# Patient Record
Sex: Male | Born: 1945 | Race: White | Hispanic: No | Smoking: Former smoker
Health system: Southern US, Community
[De-identification: ages and names within clinical notes are randomized; demographics above are authoritative.]

## PROBLEM LIST (undated history)

## (undated) DIAGNOSIS — I4891 Unspecified atrial fibrillation: Secondary | ICD-10-CM

## (undated) DIAGNOSIS — I639 Cerebral infarction, unspecified: Secondary | ICD-10-CM

## (undated) DIAGNOSIS — U071 COVID-19: Secondary | ICD-10-CM

## (undated) DIAGNOSIS — J9690 Respiratory failure, unspecified, unspecified whether with hypoxia or hypercapnia: Secondary | ICD-10-CM

## (undated) DIAGNOSIS — N19 Unspecified kidney failure: Secondary | ICD-10-CM

## (undated) DIAGNOSIS — E871 Hypo-osmolality and hyponatremia: Secondary | ICD-10-CM

## (undated) DIAGNOSIS — G9341 Metabolic encephalopathy: Secondary | ICD-10-CM

## (undated) DIAGNOSIS — Z720 Tobacco use: Secondary | ICD-10-CM

## (undated) DIAGNOSIS — R1310 Dysphagia, unspecified: Secondary | ICD-10-CM

## (undated) DIAGNOSIS — N179 Acute kidney failure, unspecified: Secondary | ICD-10-CM

## (undated) DIAGNOSIS — I10 Essential (primary) hypertension: Secondary | ICD-10-CM

## (undated) DIAGNOSIS — M6282 Rhabdomyolysis: Secondary | ICD-10-CM

## (undated) HISTORY — PX: SHOULDER SURGERY: SHX246

## (undated) HISTORY — PX: BACK SURGERY: SHX140

---

## 2019-06-15 ENCOUNTER — Emergency Department (HOSPITAL_COMMUNITY)
Admission: EM | Admit: 2019-06-15 | Discharge: 2019-06-16 | Disposition: A | Discharge status: Home / Self Care | Payer: Medicare PPO | Attending: Emergency Medicine | Admitting: Emergency Medicine

## 2019-06-15 ENCOUNTER — Other Ambulatory Visit: Payer: Self-pay

## 2019-06-15 ENCOUNTER — Emergency Department (HOSPITAL_COMMUNITY): Payer: Medicare PPO

## 2019-06-15 ENCOUNTER — Encounter (HOSPITAL_COMMUNITY): Payer: Self-pay | Admitting: Emergency Medicine

## 2019-06-15 DIAGNOSIS — Z79899 Other long term (current) drug therapy: Secondary | ICD-10-CM | POA: Insufficient documentation

## 2019-06-15 DIAGNOSIS — R42 Dizziness and giddiness: Secondary | ICD-10-CM | POA: Diagnosis present

## 2019-06-15 DIAGNOSIS — Z7982 Long term (current) use of aspirin: Secondary | ICD-10-CM | POA: Diagnosis not present

## 2019-06-15 DIAGNOSIS — F17210 Nicotine dependence, cigarettes, uncomplicated: Secondary | ICD-10-CM | POA: Insufficient documentation

## 2019-06-15 HISTORY — DX: Cerebral infarction, unspecified: I63.9

## 2019-06-15 LAB — CBC WITH DIFFERENTIAL/PLATELET
Abs Immature Granulocytes: 0.05 10*3/uL (ref 0.00–0.07)
Basophils Absolute: 0.1 10*3/uL (ref 0.0–0.1)
Basophils Relative: 1 %
Eosinophils Absolute: 0.1 10*3/uL (ref 0.0–0.5)
Eosinophils Relative: 1 %
HCT: 33.2 % — ABNORMAL LOW (ref 39.0–52.0)
Hemoglobin: 10.6 g/dL — ABNORMAL LOW (ref 13.0–17.0)
Immature Granulocytes: 0 %
Lymphocytes Relative: 9 %
Lymphs Abs: 1.1 10*3/uL (ref 0.7–4.0)
MCH: 31.7 pg (ref 26.0–34.0)
MCHC: 31.9 g/dL (ref 30.0–36.0)
MCV: 99.4 fL (ref 80.0–100.0)
Monocytes Absolute: 0.8 10*3/uL (ref 0.1–1.0)
Monocytes Relative: 7 %
Neutro Abs: 9.6 10*3/uL — ABNORMAL HIGH (ref 1.7–7.7)
Neutrophils Relative %: 82 %
Platelets: 360 10*3/uL (ref 150–400)
RBC: 3.34 MIL/uL — ABNORMAL LOW (ref 4.22–5.81)
RDW: 13.3 % (ref 11.5–15.5)
WBC: 11.7 10*3/uL — ABNORMAL HIGH (ref 4.0–10.5)
nRBC: 0 % (ref 0.0–0.2)

## 2019-06-15 LAB — COMPREHENSIVE METABOLIC PANEL
ALT: 15 U/L (ref 0–44)
AST: 19 U/L (ref 15–41)
Albumin: 3.3 g/dL — ABNORMAL LOW (ref 3.5–5.0)
Alkaline Phosphatase: 76 U/L (ref 38–126)
Anion gap: 7 (ref 5–15)
BUN: 30 mg/dL — ABNORMAL HIGH (ref 8–23)
CO2: 21 mmol/L — ABNORMAL LOW (ref 22–32)
Calcium: 10.1 mg/dL (ref 8.9–10.3)
Chloride: 101 mmol/L (ref 98–111)
Creatinine, Ser: 0.96 mg/dL (ref 0.61–1.24)
GFR calc Af Amer: >60 mL/min (ref >60)
GFR calc non Af Amer: >60 mL/min (ref >60)
Glucose, Bld: 99 mg/dL (ref 70–99)
Potassium: 3.9 mmol/L (ref 3.5–5.1)
Sodium: 129 mmol/L — ABNORMAL LOW (ref 135–145)
Total Bilirubin: 0.2 mg/dL — ABNORMAL LOW (ref 0.3–1.2)
Total Protein: 6.6 g/dL (ref 6.5–8.1)

## 2019-06-15 LAB — TROPONIN I (HIGH SENSITIVITY)
Troponin I (High Sensitivity): 19 ng/L — ABNORMAL HIGH (ref <18)
Troponin I (High Sensitivity): 18 ng/L — ABNORMAL HIGH (ref <18)

## 2019-06-15 MED ORDER — SODIUM CHLORIDE 0.9 % IV BOLUS
1000.0000 mL | Freq: Once | INTRAVENOUS | Status: AC
  Administered 2019-06-15: 20:00:00 1000 mL via INTRAVENOUS

## 2019-06-15 NOTE — Discharge Instructions (Signed)
Follow-up with family doctor within a week.  Return if any problems

## 2019-06-15 NOTE — ED Triage Notes (Signed)
Pt c/o dizziness and fall striking head. Pt arrived with c-collar.

## 2019-06-15 NOTE — ED Provider Notes (Signed)
[facility] EMERGENCY DEPARTMENT Provider Note   CSN: [id number] Arrival date & time: 06/15/19  1946     History   Chief Complaint Chief Complaint  Patient presents with   Dizziness    HPI Anthony Andrews is a 73 y.o. male.     Patient was in the chair and became dizzy and had a syncopal episode last about 15 seconds.  It was witnessed by son  The history is provided by the patient. No language interpreter was used.  Dizziness Quality:  Lightheadedness Severity:  Mild Onset quality:  Sudden Timing:  Intermittent Progression:  Improving Chronicity:  New Associated symptoms: no chest pain, no diarrhea and no headaches     Past Medical History:  Diagnosis Date   Stroke ([place])     There are no active problems to display for this patient.   History reviewed. No pertinent surgical history.      Home Medications    Prior to Admission medications   Medication Sig Start Date End Date Taking? Authorizing Provider  albuterol (VENTOLIN HFA) 108 (90 Base) MCG/ACT inhaler Inhale 2 puffs into the lungs every 4 (four) hours.  05/27/19  Yes [provider]  amLODipine (NORVASC) 5 MG tablet Take 5 mg by mouth daily. 05/25/19  Yes [provider]  aspirin EC 81 MG tablet Take 81 mg by mouth every morning.   Yes [provider]  cholecalciferol (VITAMIN D3) 25 MCG (1000 UT) tablet Take 1,000 Units by mouth daily.   Yes [provider]  PARoxetine (PAXIL) 20 MG tablet Take 20 mg by mouth every evening.    Yes [provider]  rosuvastatin (CRESTOR) 10 MG tablet Take 10 mg by mouth at bedtime. 06/08/19  Yes [provider]    Family History No family history on file.  Social History Social History   Tobacco Use   Smoking status: Current Every Day Smoker   Smokeless tobacco: Former Network engineer Use Topics   Alcohol use: Not Currently   Drug use: Never     Allergies   Patient has no known allergies.   Review of  Systems Review of Systems  Constitutional: Negative for appetite change and fatigue.  HENT: Negative for congestion, ear discharge and sinus pressure.   Eyes: Negative for discharge.  Respiratory: Negative for cough.   Cardiovascular: Negative for chest pain.  Gastrointestinal: Negative for abdominal pain and diarrhea.  Genitourinary: Negative for frequency and hematuria.  Musculoskeletal: Negative for back pain.  Skin: Negative for rash.  Neurological: Positive for dizziness. Negative for seizures and headaches.  Psychiatric/Behavioral: Negative for hallucinations.     Physical Exam Updated Vital Signs BP 130/68    Pulse 88    Temp 97.6 F (36.4 C) (Oral)    Resp 17    SpO2 98%   Physical Exam Vitals signs and nursing note reviewed.  Constitutional:      Appearance: He is well-developed.  HENT:     Head: Normocephalic.     Nose: Nose normal.  Eyes:     General: No scleral icterus.    Conjunctiva/sclera: Conjunctivae normal.  Neck:     Musculoskeletal: Neck supple.     Thyroid: No thyromegaly.  Cardiovascular:     Rate and Rhythm: Normal rate and regular rhythm.     Heart sounds: No murmur. No friction rub. No gallop.   Pulmonary:     Breath sounds: No stridor. No wheezing or rales.  Chest:     Chest wall:  No tenderness.  Abdominal:     General: There is no distension.     Tenderness: There is no abdominal tenderness. There is no rebound.  Musculoskeletal: Normal range of motion.  Lymphadenopathy:     Cervical: No cervical adenopathy.  Skin:    Findings: No erythema or rash.  Neurological:     Mental Status: He is oriented to person, place, and time.     Motor: No abnormal muscle tone.     Coordination: Coordination normal.  Psychiatric:        Behavior: Behavior normal.      ED Treatments / Results  Labs (all labs ordered are listed, but only abnormal results are displayed) Labs Reviewed  CBC WITH DIFFERENTIAL/PLATELET - Abnormal; Notable for the  following components:      Result Value   WBC 11.7 (*)    RBC 3.34 (*)    Hemoglobin 10.6 (*)    HCT 33.2 (*)    Neutro Abs 9.6 (*)    All other components within normal limits  COMPREHENSIVE METABOLIC PANEL - Abnormal; Notable for the following components:   Sodium 129 (*)    CO2 21 (*)    BUN 30 (*)    Albumin 3.3 (*)    Total Bilirubin 0.2 (*)    All other components within normal limits  TROPONIN I (HIGH SENSITIVITY) - Abnormal; Notable for the following components:   Troponin I (High Sensitivity) 19 (*)    All other components within normal limits  TROPONIN I (HIGH SENSITIVITY)    EKG EKG Interpretation  Date/Time:  Monday June 15 2019 19:54:08 EDT Ventricular Rate:  79 PR Interval:    QRS Duration: 92 QT Interval:  372 QTC Calculation: 427 R Axis:   79 Text Interpretation:  Sinus rhythm Anteroseptal infarct, old Confirmed by Milton Ferguson [phone]) on 06/15/2019 9:49:04 PM   Radiology Ct Head Wo Contrast  Result Date: 06/15/2019 CLINICAL DATA:  Status post fall with a blow to the head secondary to dizziness today. Initial encounter. EXAM: CT HEAD WITHOUT CONTRAST CT CERVICAL SPINE WITHOUT CONTRAST TECHNIQUE: Multidetector CT imaging of the head and cervical spine was performed following the standard protocol without intravenous contrast. Multiplanar CT image reconstructions of the cervical spine were also generated. COMPARISON:  None. FINDINGS: CT HEAD FINDINGS Brain: No evidence of acute infarction, hemorrhage, hydrocephalus, extra-axial collection or mass lesion/mass effect. Atrophy, chronic microvascular ischemic change, remote right thalamic and basal ganglia lacunar infarctions noted. Remote lacunar infarction left centrum semiovale also seen. Vascular: Extensive atherosclerosis. Skull: Intact.  No focal lesion. Sinuses/Orbits: Negative. Other: None. CT CERVICAL SPINE FINDINGS Alignment: Maintained with straightening of lordosis noted. Skull base and vertebrae: No  acute fracture. No primary bone lesion or focal pathologic process. Soft tissues and spinal canal: No prevertebral fluid or swelling. No visible canal hematoma. Disc levels: Multilevel loss of disc space height and endplate spurring are seen. Upper chest: Emphysema noted. Other: None. IMPRESSION: No acute abnormality head or cervical spine. Atrophy and chronic microvascular ischemic change with scattered remote lacunar infarctions. Atherosclerosis. Multilevel degenerative disc disease. Emphysema. Electronically Signed   By: Inge Rise M.D.   On: 06/15/2019 20:49   Ct Cervical Spine Wo Contrast  Result Date: 06/15/2019 CLINICAL DATA:  Status post fall with a blow to the head secondary to dizziness today. Initial encounter. EXAM: CT HEAD WITHOUT CONTRAST CT CERVICAL SPINE WITHOUT CONTRAST TECHNIQUE: Multidetector CT imaging of the head and cervical spine was performed following the standard protocol without intravenous  contrast. Multiplanar CT image reconstructions of the cervical spine were also generated. COMPARISON:  None. FINDINGS: CT HEAD FINDINGS Brain: No evidence of acute infarction, hemorrhage, hydrocephalus, extra-axial collection or mass lesion/mass effect. Atrophy, chronic microvascular ischemic change, remote right thalamic and basal ganglia lacunar infarctions noted. Remote lacunar infarction left centrum semiovale also seen. Vascular: Extensive atherosclerosis. Skull: Intact.  No focal lesion. Sinuses/Orbits: Negative. Other: None. CT CERVICAL SPINE FINDINGS Alignment: Maintained with straightening of lordosis noted. Skull base and vertebrae: No acute fracture. No primary bone lesion or focal pathologic process. Soft tissues and spinal canal: No prevertebral fluid or swelling. No visible canal hematoma. Disc levels: Multilevel loss of disc space height and endplate spurring are seen. Upper chest: Emphysema noted. Other: None. IMPRESSION: No acute abnormality head or cervical spine. Atrophy and  chronic microvascular ischemic change with scattered remote lacunar infarctions. Atherosclerosis. Multilevel degenerative disc disease. Emphysema. Electronically Signed   By: Inge Rise M.D.   On: 06/15/2019 20:49   Dg Chest Portable 1 View  Result Date: 06/15/2019 CLINICAL DATA:  Weakness, dizziness EXAM: PORTABLE CHEST 1 VIEW COMPARISON:  None. FINDINGS: The heart size and mediastinal contours are within normal limits. Coarsened interstitial markings throughout both lungs. No focal airspace consolidation. No pleural effusion or pneumothorax. IMPRESSION: 1. No focal airspace consolidation. 2. Coarsened interstitial markings throughout both lungs could represent bronchitic type lung changes versus chronic interstitial lung disease. Comparison with prior outside radiographs is recommended. Electronically Signed   By: Davina Poke M.D.   On: 06/15/2019 20:57    Procedures Procedures (including critical care time)  Medications Ordered in ED Medications  sodium chloride 0.9 % bolus 1,000 mL (0 mLs Intravenous Stopped 06/15/19 2240)     Initial Impression / Assessment and Plan / ED Course  I have reviewed the triage vital signs and the nursing notes.  Pertinent labs & imaging results that were available during my care of the patient were reviewed by me and considered in my medical decision making (see chart for details).       Labs unremarkable.  CT scan negative.  Patient with dizziness and syncope.  He will follow-up with his PCP  Final Clinical Impressions(s) / ED Diagnoses   Final diagnoses:  None    ED Discharge Orders    None       Milton Ferguson, MD 06/15/19 2249

## 2019-09-11 ENCOUNTER — Encounter (HOSPITAL_COMMUNITY): Payer: Self-pay

## 2019-09-11 ENCOUNTER — Other Ambulatory Visit: Payer: Self-pay

## 2019-09-11 ENCOUNTER — Emergency Department (HOSPITAL_COMMUNITY): Payer: Medicare PPO

## 2019-09-11 ENCOUNTER — Inpatient Hospital Stay (HOSPITAL_COMMUNITY)
Admission: EM | Admit: 2019-09-11 | Discharge: 2019-09-21 | DRG: 564 | Disposition: A | Discharge status: Skilled Nursing Facility | Payer: Medicare PPO | Attending: Internal Medicine | Admitting: Internal Medicine

## 2019-09-11 DIAGNOSIS — Y92009 Unspecified place in unspecified non-institutional (private) residence as the place of occurrence of the external cause: Secondary | ICD-10-CM

## 2019-09-11 DIAGNOSIS — I129 Hypertensive chronic kidney disease with stage 1 through stage 4 chronic kidney disease, or unspecified chronic kidney disease: Secondary | ICD-10-CM | POA: Diagnosis present

## 2019-09-11 DIAGNOSIS — F0150 Vascular dementia without behavioral disturbance: Secondary | ICD-10-CM | POA: Diagnosis present

## 2019-09-11 DIAGNOSIS — E872 Acidosis, unspecified: Secondary | ICD-10-CM | POA: Diagnosis present

## 2019-09-11 DIAGNOSIS — I358 Other nonrheumatic aortic valve disorders: Secondary | ICD-10-CM | POA: Diagnosis present

## 2019-09-11 DIAGNOSIS — Z79899 Other long term (current) drug therapy: Secondary | ICD-10-CM

## 2019-09-11 DIAGNOSIS — R7989 Other specified abnormal findings of blood chemistry: Secondary | ICD-10-CM

## 2019-09-11 DIAGNOSIS — J9601 Acute respiratory failure with hypoxia: Secondary | ICD-10-CM | POA: Diagnosis not present

## 2019-09-11 DIAGNOSIS — N182 Chronic kidney disease, stage 2 (mild): Secondary | ICD-10-CM | POA: Diagnosis present

## 2019-09-11 DIAGNOSIS — E860 Dehydration: Secondary | ICD-10-CM | POA: Diagnosis present

## 2019-09-11 DIAGNOSIS — E8809 Other disorders of plasma-protein metabolism, not elsewhere classified: Secondary | ICD-10-CM | POA: Diagnosis present

## 2019-09-11 DIAGNOSIS — I6522 Occlusion and stenosis of left carotid artery: Secondary | ICD-10-CM | POA: Diagnosis present

## 2019-09-11 DIAGNOSIS — W19XXXA Unspecified fall, initial encounter: Secondary | ICD-10-CM

## 2019-09-11 DIAGNOSIS — Z7982 Long term (current) use of aspirin: Secondary | ICD-10-CM

## 2019-09-11 DIAGNOSIS — M6282 Rhabdomyolysis: Secondary | ICD-10-CM | POA: Diagnosis present

## 2019-09-11 DIAGNOSIS — J90 Pleural effusion, not elsewhere classified: Secondary | ICD-10-CM | POA: Diagnosis present

## 2019-09-11 DIAGNOSIS — D638 Anemia in other chronic diseases classified elsewhere: Secondary | ICD-10-CM | POA: Diagnosis present

## 2019-09-11 DIAGNOSIS — G9341 Metabolic encephalopathy: Secondary | ICD-10-CM | POA: Diagnosis present

## 2019-09-11 DIAGNOSIS — R001 Bradycardia, unspecified: Secondary | ICD-10-CM | POA: Diagnosis present

## 2019-09-11 DIAGNOSIS — W010XXA Fall on same level from slipping, tripping and stumbling without subsequent striking against object, initial encounter: Secondary | ICD-10-CM | POA: Diagnosis present

## 2019-09-11 DIAGNOSIS — D631 Anemia in chronic kidney disease: Secondary | ICD-10-CM | POA: Diagnosis present

## 2019-09-11 DIAGNOSIS — Z23 Encounter for immunization: Secondary | ICD-10-CM

## 2019-09-11 DIAGNOSIS — T796XXA Traumatic ischemia of muscle, initial encounter: Secondary | ICD-10-CM | POA: Diagnosis not present

## 2019-09-11 DIAGNOSIS — N179 Acute kidney failure, unspecified: Secondary | ICD-10-CM | POA: Diagnosis present

## 2019-09-11 DIAGNOSIS — Z8249 Family history of ischemic heart disease and other diseases of the circulatory system: Secondary | ICD-10-CM

## 2019-09-11 DIAGNOSIS — Z20828 Contact with and (suspected) exposure to other viral communicable diseases: Secondary | ICD-10-CM | POA: Diagnosis present

## 2019-09-11 DIAGNOSIS — F17210 Nicotine dependence, cigarettes, uncomplicated: Secondary | ICD-10-CM | POA: Diagnosis present

## 2019-09-11 DIAGNOSIS — I471 Supraventricular tachycardia: Secondary | ICD-10-CM | POA: Diagnosis present

## 2019-09-11 DIAGNOSIS — R0602 Shortness of breath: Secondary | ICD-10-CM

## 2019-09-11 DIAGNOSIS — R4182 Altered mental status, unspecified: Secondary | ICD-10-CM

## 2019-09-11 DIAGNOSIS — I69354 Hemiplegia and hemiparesis following cerebral infarction affecting left non-dominant side: Secondary | ICD-10-CM

## 2019-09-11 DIAGNOSIS — I4891 Unspecified atrial fibrillation: Secondary | ICD-10-CM | POA: Diagnosis not present

## 2019-09-11 DIAGNOSIS — E1122 Type 2 diabetes mellitus with diabetic chronic kidney disease: Secondary | ICD-10-CM | POA: Diagnosis present

## 2019-09-11 DIAGNOSIS — J439 Emphysema, unspecified: Secondary | ICD-10-CM | POA: Diagnosis present

## 2019-09-11 DIAGNOSIS — E871 Hypo-osmolality and hyponatremia: Secondary | ICD-10-CM | POA: Diagnosis present

## 2019-09-11 DIAGNOSIS — Z716 Tobacco abuse counseling: Secondary | ICD-10-CM

## 2019-09-11 HISTORY — DX: Metabolic encephalopathy: G93.41

## 2019-09-11 HISTORY — DX: Rhabdomyolysis: M62.82

## 2019-09-11 HISTORY — DX: Tobacco use: Z72.0

## 2019-09-11 HISTORY — DX: Essential (primary) hypertension: I10

## 2019-09-11 HISTORY — DX: Hypo-osmolality and hyponatremia: E87.1

## 2019-09-11 HISTORY — DX: Acute kidney failure, unspecified: N17.9

## 2019-09-11 LAB — CBC WITH DIFFERENTIAL/PLATELET
Abs Immature Granulocytes: 0.07 10*3/uL (ref 0.00–0.07)
Basophils Absolute: 0.1 10*3/uL (ref 0.0–0.1)
Basophils Relative: 0 %
Eosinophils Absolute: 0 10*3/uL (ref 0.0–0.5)
Eosinophils Relative: 0 %
HCT: 33.5 % — ABNORMAL LOW (ref 39.0–52.0)
Hemoglobin: 11.7 g/dL — ABNORMAL LOW (ref 13.0–17.0)
Immature Granulocytes: 1 %
Lymphocytes Relative: 8 %
Lymphs Abs: 1.1 10*3/uL (ref 0.7–4.0)
MCH: 32.8 pg (ref 26.0–34.0)
MCHC: 34.9 g/dL (ref 30.0–36.0)
MCV: 93.8 fL (ref 80.0–100.0)
Monocytes Absolute: 0.8 10*3/uL (ref 0.1–1.0)
Monocytes Relative: 6 %
Neutro Abs: 10.9 10*3/uL — ABNORMAL HIGH (ref 1.7–7.7)
Neutrophils Relative %: 85 %
Platelets: 532 10*3/uL — ABNORMAL HIGH (ref 150–400)
RBC: 3.57 MIL/uL — ABNORMAL LOW (ref 4.22–5.81)
RDW: 12.1 % (ref 11.5–15.5)
WBC: 12.9 10*3/uL — ABNORMAL HIGH (ref 4.0–10.5)
nRBC: 0 % (ref 0.0–0.2)

## 2019-09-11 LAB — COMPREHENSIVE METABOLIC PANEL
ALT: 58 U/L — ABNORMAL HIGH (ref 0–44)
AST: 194 U/L — ABNORMAL HIGH (ref 15–41)
Albumin: 2.8 g/dL — ABNORMAL LOW (ref 3.5–5.0)
Alkaline Phosphatase: 72 U/L (ref 38–126)
Anion gap: 14 (ref 5–15)
BUN: 24 mg/dL — ABNORMAL HIGH (ref 8–23)
CO2: 19 mmol/L — ABNORMAL LOW (ref 22–32)
Calcium: 10.4 mg/dL — ABNORMAL HIGH (ref 8.9–10.3)
Chloride: 96 mmol/L — ABNORMAL LOW (ref 98–111)
Creatinine, Ser: 1.45 mg/dL — ABNORMAL HIGH (ref 0.61–1.24)
GFR calc Af Amer: 56 mL/min — ABNORMAL LOW (ref >60)
GFR calc non Af Amer: 48 mL/min — ABNORMAL LOW (ref >60)
Glucose, Bld: 105 mg/dL — ABNORMAL HIGH (ref 70–99)
Potassium: 4.5 mmol/L (ref 3.5–5.1)
Sodium: 129 mmol/L — ABNORMAL LOW (ref 135–145)
Total Bilirubin: 0.5 mg/dL (ref 0.3–1.2)
Total Protein: 7 g/dL (ref 6.5–8.1)

## 2019-09-11 LAB — CK: Total CK: 4536 U/L — ABNORMAL HIGH (ref 49–397)

## 2019-09-11 LAB — ETHANOL: Alcohol, Ethyl (B): <10 mg/dL (ref <10)

## 2019-09-11 MED ORDER — ACETAMINOPHEN 325 MG PO TABS
650.0000 mg | ORAL_TABLET | Freq: Four times a day (QID) | ORAL | Status: DC | PRN
  Administered 2019-09-18 – 2019-09-20 (×2): 650 mg via ORAL
  Filled 2019-09-11 (×3): qty 2

## 2019-09-11 MED ORDER — NICOTINE 7 MG/24HR TD PT24
7.0000 mg | MEDICATED_PATCH | Freq: Every day | TRANSDERMAL | Status: DC
  Administered 2019-09-12 – 2019-09-21 (×9): 7 mg via TRANSDERMAL
  Filled 2019-09-11 (×10): qty 1

## 2019-09-11 MED ORDER — ONDANSETRON HCL 4 MG/2ML IJ SOLN
4.0000 mg | Freq: Once | INTRAMUSCULAR | Status: AC
  Administered 2019-09-11: 19:00:00 4 mg via INTRAVENOUS
  Filled 2019-09-11: qty 2

## 2019-09-11 MED ORDER — SODIUM CHLORIDE 0.9 % IV SOLN: INTRAVENOUS | Status: AC

## 2019-09-11 MED ORDER — FENTANYL CITRATE (PF) 100 MCG/2ML IJ SOLN: 100.0000 ug | Freq: Once | INTRAMUSCULAR | Status: DC

## 2019-09-11 MED ORDER — ENOXAPARIN SODIUM 40 MG/0.4ML ~~LOC~~ SOLN
40.0000 mg | Freq: Every day | SUBCUTANEOUS | Status: DC
  Administered 2019-09-12 – 2019-09-16 (×5): 40 mg via SUBCUTANEOUS
  Filled 2019-09-11 (×5): qty 0.4

## 2019-09-11 MED ORDER — SODIUM CHLORIDE 0.9 % IV BOLUS
1000.0000 mL | Freq: Once | INTRAVENOUS | Status: AC
  Administered 2019-09-11: 21:00:00 1000 mL via INTRAVENOUS

## 2019-09-11 MED ORDER — ACETAMINOPHEN 650 MG RE SUPP: 650.0000 mg | Freq: Four times a day (QID) | RECTAL | Status: DC | PRN

## 2019-09-11 MED ORDER — SODIUM CHLORIDE 0.9 % IV BOLUS
1000.0000 mL | Freq: Once | INTRAVENOUS | Status: AC
  Administered 2019-09-11: 19:00:00 1000 mL via INTRAVENOUS

## 2019-09-11 MED ORDER — ONDANSETRON HCL 4 MG/2ML IJ SOLN: 4.0000 mg | Freq: Once | INTRAMUSCULAR | Status: DC

## 2019-09-11 NOTE — ED Provider Notes (Signed)
[facility] EMERGENCY DEPARTMENT Provider Note   CSN: [id number] Arrival date & time: 09/11/19  1803     History Chief Complaint  Patient presents with  . Fall    Anthony Andrews is a 73 y.o. male possible history of stroke brought in by EMS for evaluation of fall.  Patient reports that last night, he went out to have a smoke and when he returned, he tripped over the threshold of the door and fell forward, hitting his head.  He does not think he had any LOC.  He had had difficulty getting himself up and could not get off the floor.  He sat there for about 18 hours until his son found him.  After his son found him, he was able to ambulate.  Patient went out to go have another smoking and had an episode of vomiting, prompting EMS call.  Patient states that he did not have any preceding chest pain or dizziness that caused him to fall.  He states he has pain to his head and to his left shoulder.  He states that he feels like he has some trouble breathing and states that he does usually use an inhaler.  Patient denies any chest pain, abdominal pain, numbness/weakness of his arms or legs.  The history is provided by the patient.       Past Medical History:  Diagnosis Date  . Stroke [facility])     Patient Active Problem List   Diagnosis Date Noted  . Rhabdomyolysis 09/11/2019    History reviewed. No pertinent surgical history.     No family history on file.  Social History   Tobacco Use  . Smoking status: Current Every Day Smoker  . Smokeless tobacco: Former Network engineer Use Topics  . Alcohol use: Not Currently  . Drug use: Never    Home Medications Prior to Admission medications   Medication Sig Start Date End Date Taking? Authorizing Provider  albuterol (VENTOLIN HFA) 108 (90 Base) MCG/ACT inhaler Inhale 2 puffs into the lungs every 4 (four) hours.  05/27/19   [provider]  amLODipine (NORVASC) 5 MG tablet Take 5 mg by mouth daily. 05/25/19    [provider]  aspirin EC 81 MG tablet Take 81 mg by mouth every morning.    [provider]  cholecalciferol (VITAMIN D3) 25 MCG (1000 UT) tablet Take 1,000 Units by mouth daily.    [provider]  PARoxetine (PAXIL) 20 MG tablet Take 20 mg by mouth every evening.     [provider]  rosuvastatin (CRESTOR) 10 MG tablet Take 10 mg by mouth at bedtime. 06/08/19   [provider]    Allergies    Patient has no known allergies.  Review of Systems   Review of Systems  Constitutional: Negative for fever.  Respiratory: Negative for cough and shortness of breath.   Cardiovascular: Negative for chest pain.  Gastrointestinal: Negative for abdominal pain, nausea and vomiting.  Genitourinary: Negative for dysuria and hematuria.  Musculoskeletal:       Left shoulder pain.  Neurological: Positive for headaches.  All other systems reviewed and are negative.   Physical Exam Updated Vital Signs BP 103/63   Pulse 98   Temp 98.2 F (36.8 C) (Rectal)   Resp (!) 25   Ht 5\' 3"  (1.6 m)   Wt 54.4 kg   SpO2 99%   BMI 21.26 kg/m   Physical Exam Vitals and nursing note reviewed.  Constitutional:  Appearance: Normal appearance. He is well-developed.  HENT:     Head: Normocephalic and atraumatic.      Comments: Tenderness palpation of the frontal head.  No deformity or crepitus noted. Eyes:     General: Lids are normal.     Conjunctiva/sclera: Conjunctivae normal.     Pupils: Pupils are equal, round, and reactive to light.  Neck:     Comments: C-collar in place. Cardiovascular:     Rate and Rhythm: Normal rate and regular rhythm.     Pulses: Normal pulses.          Radial pulses are 2+ on the right side and 2+ on the left side.       Dorsalis pedis pulses are 2+ on the right side and 2+ on the left side.     Heart sounds: Normal heart sounds. No murmur. No friction rub. No gallop.   Pulmonary:     Effort: Pulmonary effort is normal.      Breath sounds: Normal breath sounds.     Comments: Lungs clear to auscultation bilaterally.  Symmetric chest rise.  No wheezing, rales, rhonchi. Abdominal:     Palpations: Abdomen is soft. Abdomen is not rigid.     Tenderness: There is no abdominal tenderness. There is no guarding.     Comments: Abdomen is soft, non-distended, non-tender. No rigidity, No guarding. No peritoneal signs.  Musculoskeletal:        General: Normal range of motion.     Comments: Tenderness palpation noted to left shoulder.  No deformity or crepitus noted.  Limited range of motion secondary to pain.  No bony tenderness of the left elbow, left wrist.  No tenderness palpation noted to right upper extremity.  No pelvic instability.  No tenderness palpation of the bilateral lower extremities.  Skin:    General: Skin is warm and dry.     Capillary Refill: Capillary refill takes less than 2 seconds.  Neurological:     Mental Status: He is alert.     Comments: He is alert and oriented x2.  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 Cranial nerves III-XII intact Follows commands, Moves all extremities  5/5 strength to BUE and BLE  Sensation intact throughout all major nerve distributions No slurred speech. No facial droop.   Psychiatric:        Speech: Speech normal.     ED Results / Procedures / Treatments   Labs (all labs ordered are listed, but only abnormal results are displayed) Labs Reviewed  COMPREHENSIVE METABOLIC PANEL - Abnormal; Notable for the following components:      Result Value   Sodium 129 (*)    Chloride 96 (*)    CO2 19 (*)    Glucose, Bld 105 (*)    BUN 24 (*)    Creatinine, Ser 1.45 (*)    Calcium 10.4 (*)    Albumin 2.8 (*)    AST 194 (*)    ALT 58 (*)    GFR calc non Af Amer 48 (*)    GFR calc Af Amer 56 (*)    All other components within normal limits  CBC WITH DIFFERENTIAL/PLATELET  - Abnormal; Notable for the following components:   WBC 12.9 (*)    RBC 3.57 (*)    Hemoglobin 11.7 (*)    HCT 33.5 (*)  Platelets 532 (*)    Neutro Abs 10.9 (*)    All other components within normal limits  CK - Abnormal; Notable for the following components:   Total CK 4,536 (*)    All other components within normal limits  SARS CORONAVIRUS 2 (TAT 6-24 HRS)  ETHANOL  URINALYSIS, ROUTINE W REFLEX MICROSCOPIC  CK  COMPREHENSIVE METABOLIC PANEL    EKG None  Radiology DG Chest 2 View  Result Date: 09/11/2019 CLINICAL DATA:  Fall with chest injury and pain. EXAM: CHEST - 2 VIEW COMPARISON:  06/15/2011 FINDINGS: The cardiomediastinal silhouette is unremarkable. Mild chronic peribronchial thickening again noted. There is no evidence of focal airspace disease, pulmonary edema, suspicious pulmonary nodule/mass, pleural effusion, or pneumothorax. No acute bony abnormalities are identified. Surgical changes in the LEFT scapula noted. IMPRESSION: No active cardiopulmonary disease. Electronically Signed   By: Margarette Canada M.D.   On: 09/11/2019 19:45   DG Pelvis 1-2 Views  Result Date: 09/11/2019 CLINICAL DATA:  Fall, pain EXAM: PELVIS - 1-2 VIEW COMPARISON:  None. FINDINGS: Hip joints and SI joints are symmetric and unremarkable. No acute bony abnormality. Specifically, no fracture, subluxation, or dislocation. Postoperative changes in the lower lumbar spine. IMPRESSION: No acute bony abnormality. Electronically Signed   By: Rolm Baptise M.D.   On: 09/11/2019 19:27   CT Head Wo Contrast  Result Date: 09/11/2019 CLINICAL DATA:  Right-sided head pain after falling and striking head. EXAM: CT HEAD WITHOUT CONTRAST TECHNIQUE: Contiguous axial images were obtained from the base of the skull through the vertex without intravenous contrast. COMPARISON:  CT head 06/15/2019. FINDINGS: Brain: There is no evidence of acute intracranial hemorrhage, mass lesion, brain edema or extra-axial fluid  collection. There is stable atrophy with prominence of the ventricles and subarachnoid spaces. There are chronic small vessel ischemic changes in the periventricular white matter and basal ganglia bilaterally. There is no CT evidence of acute cortical infarction. Vascular: Intracranial vascular calcifications. No hyperdense vessel identified. Skull: Negative for fracture or focal lesion. Sinuses/Orbits: The visualized paranasal sinuses are clear. There is a chronic mastoid effusion on the right with partial opacification of right middle ear. The mastoid air cells and middle ear on the left are clear. Other: None. IMPRESSION: 1. No acute intracranial or calvarial findings. 2. Stable atrophy and chronic small vessel ischemic changes. 3. Chronic right mastoid effusion and partial right middle ear opacification. Electronically Signed   By: Richardean Sale M.D.   On: 09/11/2019 20:43   CT Cervical Spine Wo Contrast  Result Date: 09/11/2019 CLINICAL DATA:  Mechanical fall in the intra way, impact to head and concrete floor. EXAM: CT CERVICAL SPINE WITHOUT CONTRAST TECHNIQUE: Multidetector CT imaging of the cervical spine was performed without intravenous contrast. Multiplanar CT image reconstructions were also generated. COMPARISON:  06/15/2019 FINDINGS: Alignment: Normal. Skull base and vertebrae: No acute fracture. No primary bone lesion or focal pathologic process. Soft tissues and spinal canal: No prevertebral fluid or swelling. No visible canal hematoma. Atherosclerotic calcifications in the bilateral carotid arteries. Disc levels: Multilevel spinal degenerative changes without change from the previous exam. Upper chest: Signs of emphysema at the lung apices as before. Some apical scarring as well. Other: None IMPRESSION: 1. Spinal degenerative changes without acute fracture of the cervical spine. 2. Pulmonary emphysema. 3. Incidental note again made of atherosclerotic calcifications, dense calcifications of the  carotid bulbs bilaterally. Electronically Signed   By: Zetta Bills M.D.   On: 09/11/2019 20:45   DG Shoulder Left  Result Date: 09/11/2019 CLINICAL DATA:  Pain, fall. EXAM: LEFT SHOULDER - 2+ VIEW COMPARISON:  Chest x-ray 06/15/2019 FINDINGS: Screw Mains in place within the scapula. Glenohumeral joint is located with signs of degenerative change. No signs of acute fracture or dislocation. IMPRESSION: 1. Postoperative changes about the left shoulder, no signs of acute fracture. Electronically Signed   By: Zetta Bills M.D.   On: 09/11/2019 19:25    Procedures Procedures (including critical care time)  Medications Ordered in ED Medications  fentaNYL (SUBLIMAZE) injection 100 mcg (100 mcg Intravenous Not Given 09/11/19 2302)  ondansetron (ZOFRAN) injection 4 mg (4 mg Intravenous Not Given 09/11/19 2302)  sodium chloride 0.9 % bolus 1,000 mL (0 mLs Intravenous Stopped 09/11/19 2050)  ondansetron (ZOFRAN) injection 4 mg (4 mg Intravenous Given 09/11/19 1836)  sodium chloride 0.9 % bolus 1,000 mL (1,000 mLs Intravenous New Bag/Given 09/11/19 2056)    ED Course  I have reviewed the triage vital signs and the nursing notes.  Pertinent labs & imaging results that were available during my care of the patient were reviewed by me and considered in my medical decision making (see chart for details).  Clinical Course as of Sep 10 2316  Fri Sep 11, 2019  1941 Patient was seen by myself as well as PA provider.  Briefly is a 73 year old male presents to emergency department after mechanical fall and spending approximately 10 hours in the ground.  He reports his left leg gave out on him and he tripped over a door stoop yesterday evening.  He fell indoors and then was able to get up.  He said he had no support to pull himself up.  He denies any lightheadedness or loss of consciousness prior to this fall.  He states his son came by today and found him on the ground.  The patient typically lives at home  with his wife, is currently in the hospital for broken hip.   [MT]  2102 Back from CT.  Collar cleared.  Will bolus IVF and recheck labs   [MT]    Clinical Course User Index [MT] Trifan, Carola Rhine, MD   MDM Rules/Calculators/A&P                      73 year old male brought in by EMS for evaluation after mechanical fall that occurred approximately 18 hours prior to ED arrival.  Patient had prolonged downtime.  Denies any chest pain or dizziness.  On ED arrival, he complains of pain to his head, his left shoulder.  Initially arrival, he is afebrile, nontoxic-appearing.  He is slightly tachycardic and tachypneic.  Vitals otherwise stable.  Plan check labs, imaging.  CMP shows sodium 129, BUN 24, creatinine 1.45.  AST and ALT is slightly elevated.  CBC shows leukocytosis of 12.9.  Hemoglobin is 11.7.  CK is 4536.  X-ray of chest is unremarkable.  X-ray of pelvis shows no acute bony abnormalities. X-ray left shoulder shows positive postoperative changes about the left shoulder.  No signs of acute fracture.  CT head negative for any acute intracranial normality.  CT C-spine shows degenerative changes but no acute fracture dislocation.  Re-evaluation. Patient still saying that its 2004 and Anthony Andrews is president.   I discussed with his son Anthony Andrews, who states that patient is normally not confused. He is concerned that dad may be deteriorating after his stroke. He has residual LLE weakness/deficits.   Given continued altered mental status and rhabdomyolysis, will plan to admit  patient.  Discussed patient with Dr. Marlowe Sax (hospitalist) who accepts patient for admission.   Portions of this note were generated with [employer]. Dictation errors may occur despite best attempts at proofreading.   Final Clinical Impression(s) / ED Diagnoses Final diagnoses:  Fall, initial encounter  Traumatic rhabdomyolysis, initial encounter ([place])  Altered mental status, unspecified altered  mental status type    Rx / DC Orders ED Discharge Orders    None       Desma Mcgregor 09/11/19 2317    Wyvonnia Dusky, MD 09/12/19 1134

## 2019-09-11 NOTE — H&P (Addendum)
History and Physical    Anthony Andrews TAV:[id number] DOB: 12/11/1945 DOA: 09/11/2019  PCP: Patient, No Pcp Per Patient coming from: Home  Chief Complaint: Fall  HPI: Anthony Andrews is a 73 y.o. 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  Patient states he tripped over a door frame and fell on the ground.  He hit his head but did not lose consciousness.  He was down on the ground for approximately 18 hours.  He was found by his son who called EMS.  Denies any neck pain, back pain, or pain anywhere else.  He did not have any dizziness/lightheadedness, chest pain, shortness of breath, or heart palpitations prior to his fall.  Denies recent illness, fevers, or chills.  Denies nausea, vomiting, diarrhea, or abdominal pain.  States he has a chronic cough as he is a smoker.  He is not any blood thinners.  ED Course: Slightly and tachypneic.  Blood pressure stable.  Not hypoxic.  Afebrile.  WBC count 12.9.  Hemoglobin 11.7, above baseline.  Platelet count 532,000, above baseline.  Sodium 129, no change since labs done 3 months ago.  Bicarb 19, anion gap 14.  Blood glucose 105.  BUN 24, creatinine 1.4.  Creatinine was 0.9 previously.  AST 194, ALT 58.  Alk phos and T bili normal. Transaminases were normal previously.  UA pending.  Blood ethanol level undetectable.  CK 4536.  SARS-CoV-2 PCR test pending. Chest x-ray showing no active cardiopulmonary disease. Pelvic x-ray negative for fracture/dislocation. X-ray of left shoulder negative for fracture/dislocation. Head CT negative for acute intracranial abnormality. CT C-spine negative for acute fracture. Patient appeared disoriented.  He thought it was 2004 and [name] was the president.  Son told ED provider that this was a change as patient is normally not confused. Patient received fentanyl, Zofran, and 2 L normal saline  boluses.  Review of Systems:  All systems reviewed and apart from history of presenting illness, are negative.  Past Medical History:  Diagnosis Date  . Stroke [facility])     History reviewed. No pertinent surgical history.   reports that he has been smoking. He has quit using smokeless tobacco. He reports previous alcohol use. He reports that he does not use drugs.  No Known Allergies  History reviewed. No pertinent family history.  Prior to Admission medications   Medication Sig Start Date End Date Taking? Authorizing Provider  albuterol (VENTOLIN HFA) 108 (90 Base) MCG/ACT inhaler Inhale 2 puffs into the lungs every 4 (four) hours.  05/27/19   [provider]  amLODipine (NORVASC) 5 MG tablet Take 5 mg by mouth daily. 05/25/19   [provider]  aspirin EC 81 MG tablet Take 81 mg by mouth every morning.    [provider]  cholecalciferol (VITAMIN D3) 25 MCG (1000 UT) tablet Take 1,000 Units by mouth daily.    [provider]  PARoxetine (PAXIL) 20 MG tablet Take 20 mg by mouth every evening.     [provider]  rosuvastatin (CRESTOR) 10 MG tablet Take 10 mg by mouth at bedtime. 06/08/19   [provider]    Physical Exam: Vitals:   09/11/19 2130 09/11/19 2230 09/11/19 2245 09/11/19 2300  BP: (!) 133/99 109/87 107/66 103/63  Pulse:  (!) 104 98 98  Resp: 19 (!) 22 (!) 25 (!) 25  Temp:  TempSrc:      SpO2:  100% 99% 99%  Weight:      Height:        Physical Exam  Constitutional: No distress.  HENT:  Head: Normocephalic.  Dry mucous membranes  Eyes: Right eye exhibits no discharge. Left eye exhibits no discharge.  Cardiovascular: Normal rate, regular rhythm and intact distal pulses.  Pulmonary/Chest: Effort normal and breath sounds normal. No respiratory distress. He has no wheezes. He has no rales.  Abdominal: Soft. Bowel sounds are normal. He exhibits no distension. There is no abdominal tenderness. There is no  guarding.  Musculoskeletal:        General: No edema.     Cervical back: Neck supple.  Neurological: He is alert. No cranial nerve deficit.  Patient knows his name.  He is aware that he is at a hospital.  Does not know which city he is in.  Not oriented to time.  He thinks it is 2010.  He thinks [name] is the president. No focal motor or sensory deficit  Skin: Skin is warm and dry. He is not diaphoretic.  Decreased skin turgor     Labs on Admission: I have personally reviewed following labs and imaging studies  CBC: Recent Labs  Lab 09/11/19 1838  WBC 12.9*  NEUTROABS 10.9*  HGB 11.7*  HCT 33.5*  MCV 93.8  PLT 161*   Basic Metabolic Panel: Recent Labs  Lab 09/11/19 1838  NA 129*  K 4.5  CL 96*  CO2 19*  GLUCOSE 105*  BUN 24*  CREATININE 1.45*  CALCIUM 10.4*   GFR: Estimated Creatinine Clearance: 36 mL/min (A) (by C-G formula based on SCr of 1.45 mg/dL (H)). Liver Function Tests: Recent Labs  Lab 09/11/19 1838  AST 194*  ALT 58*  ALKPHOS 72  BILITOT 0.5  PROT 7.0  ALBUMIN 2.8*   No results for input(s): LIPASE, AMYLASE in the last 168 hours. No results for input(s): AMMONIA in the last 168 hours. Coagulation Profile: No results for input(s): INR, PROTIME in the last 168 hours. Cardiac Enzymes: Recent Labs  Lab 09/11/19 1838  CKTOTAL 4,536*   BNP (last 3 results) No results for input(s): PROBNP in the last 8760 hours. HbA1C: No results for input(s): HGBA1C in the last 72 hours. CBG: No results for input(s): GLUCAP in the last 168 hours. Lipid Profile: No results for input(s): CHOL, HDL, LDLCALC, TRIG, CHOLHDL, LDLDIRECT in the last 72 hours. Thyroid Function Tests: No results for input(s): TSH, T4TOTAL, FREET4, T3FREE, THYROIDAB in the last 72 hours. Anemia Panel: No results for input(s): VITAMINB12, FOLATE, FERRITIN, TIBC, IRON, RETICCTPCT in the last 72 hours. Urine analysis: No results found for: COLORURINE, APPEARANCEUR, LABSPEC,  [place], GLUCOSEU, HGBUR, BILIRUBINUR, KETONESUR, PROTEINUR, UROBILINOGEN, NITRITE, LEUKOCYTESUR  Radiological Exams on Admission: DG Chest 2 View  Result Date: 09/11/2019 CLINICAL DATA:  Fall with chest injury and pain. EXAM: CHEST - 2 VIEW COMPARISON:  06/15/2011 FINDINGS: The cardiomediastinal silhouette is unremarkable. Mild chronic peribronchial thickening again noted. There is no evidence of focal airspace disease, pulmonary edema, suspicious pulmonary nodule/mass, pleural effusion, or pneumothorax. No acute bony abnormalities are identified. Surgical changes in the LEFT scapula noted. IMPRESSION: No active cardiopulmonary disease. Electronically Signed   By: Margarette Canada M.D.   On: 09/11/2019 19:45   DG Pelvis 1-2 Views  Result Date: 09/11/2019 CLINICAL DATA:  Fall, pain EXAM: PELVIS - 1-2 VIEW COMPARISON:  None. FINDINGS: Hip joints and SI joints are symmetric and unremarkable. No acute  bony abnormality. Specifically, no fracture, subluxation, or dislocation. Postoperative changes in the lower lumbar spine. IMPRESSION: No acute bony abnormality. Electronically Signed   By: Rolm Baptise M.D.   On: 09/11/2019 19:27   CT Head Wo Contrast  Result Date: 09/11/2019 CLINICAL DATA:  Right-sided head pain after falling and striking head. EXAM: CT HEAD WITHOUT CONTRAST TECHNIQUE: Contiguous axial images were obtained from the base of the skull through the vertex without intravenous contrast. COMPARISON:  CT head 06/15/2019. FINDINGS: Brain: There is no evidence of acute intracranial hemorrhage, mass lesion, brain edema or extra-axial fluid collection. There is stable atrophy with prominence of the ventricles and subarachnoid spaces. There are chronic small vessel ischemic changes in the periventricular white matter and basal ganglia bilaterally. There is no CT evidence of acute cortical infarction. Vascular: Intracranial vascular calcifications. No hyperdense vessel identified. Skull: Negative for  fracture or focal lesion. Sinuses/Orbits: The visualized paranasal sinuses are clear. There is a chronic mastoid effusion on the right with partial opacification of right middle ear. The mastoid air cells and middle ear on the left are clear. Other: None. IMPRESSION: 1. No acute intracranial or calvarial findings. 2. Stable atrophy and chronic small vessel ischemic changes. 3. Chronic right mastoid effusion and partial right middle ear opacification. Electronically Signed   By: Richardean Sale M.D.   On: 09/11/2019 20:43   CT Cervical Spine Wo Contrast  Result Date: 09/11/2019 CLINICAL DATA:  Mechanical fall in the intra way, impact to head and concrete floor. EXAM: CT CERVICAL SPINE WITHOUT CONTRAST TECHNIQUE: Multidetector CT imaging of the cervical spine was performed without intravenous contrast. Multiplanar CT image reconstructions were also generated. COMPARISON:  06/15/2019 FINDINGS: Alignment: Normal. Skull base and vertebrae: No acute fracture. No primary bone lesion or focal pathologic process. Soft tissues and spinal canal: No prevertebral fluid or swelling. No visible canal hematoma. Atherosclerotic calcifications in the bilateral carotid arteries. Disc levels: Multilevel spinal degenerative changes without change from the previous exam. Upper chest: Signs of emphysema at the lung apices as before. Some apical scarring as well. Other: None IMPRESSION: 1. Spinal degenerative changes without acute fracture of the cervical spine. 2. Pulmonary emphysema. 3. Incidental note again made of atherosclerotic calcifications, dense calcifications of the carotid bulbs bilaterally. Electronically Signed   By: Zetta Bills M.D.   On: 09/11/2019 20:45   DG Shoulder Left  Result Date: 09/11/2019 CLINICAL DATA:  Pain, fall. EXAM: LEFT SHOULDER - 2+ VIEW COMPARISON:  Chest x-ray 06/15/2019 FINDINGS: Screw Mains in place within the scapula. Glenohumeral joint is located with signs of degenerative change. No  signs of acute fracture or dislocation. IMPRESSION: 1. Postoperative changes about the left shoulder, no signs of acute fracture. Electronically Signed   By: Zetta Bills M.D.   On: 09/11/2019 19:25    EKG: Pending at this time.  Assessment/Plan Principal Problem:   Rhabdomyolysis Active Problems:   AKI (acute kidney injury) ([place])   Acute metabolic encephalopathy   Chronic hyponatremia   Metabolic acidosis   Rhabdomyolysis Patient had a mechanical fall at home and was down on the ground for 18 hours.  CK elevated at 4536.  Appears very dehydrated. -Received 2 L normal saline boluses.  Continue IV fluid hydration. -Continue to trend CK  AKI Likely secondary to rhabdomyolysis and dehydration. BUN 24, creatinine 1.4.  Creatinine was 0.9 previously. -IV fluid hydration -Continue to monitor renal function -Monitor urine output  Mechanical fall He has not sustained any major injuries from the fall.  CT head, CT C-spine, pelvic x-ray, and left shoulder x-ray negative for acute finding.  Not complaining of any pain and appears comfortable on exam. -PT evaluation  Acute metabolic encephalopathy Appears slightly confused and disoriented.  He does not know which city he is in.  He thinks it is 2010 and that [name] is the president.  Could possibly be related to severe dehydration. ?Underlying infection given mild leukocytosis.  However, chest x-ray not suggestive of pneumonia.  Head CT negative for acute intracranial abnormality.  Blood ethanol level undetectable. -UA pending -Check ammonia, TSH, B12 levels -UDS  Chronic hyponatremia Sodium 129, no change since labs done 3 months ago.  Does not take a diuretic.  Not endorsing any vomiting or diarrhea. -IV fluid hydration -Continue to monitor sodium level  Normal anion gap metabolic acidosis Bicarb 19, anion gap 14.  Likely related to AKI. -IV fluid hydration -Continue to monitor  Mild transaminitis Suspect related to  rhabdomyolysis.  AST 194, ALT 58.  Alk phos and T bili normal. Transaminases were normal on labs done 3 months ago.  Patient denies current alcohol use. -IV fluid hydration for rhabdomyolysis -Continue to monitor LFTs  Tobacco use Smokes 3 cigarettes/day. -Counseling -NicoDerm patch  Pharmacy med rec pending.  DVT prophylaxis: Lovenox Code Status: Full code Family Communication: No family available at this time. Disposition Plan: Anticipate discharge after clinical improvement. Consults called: None Admission status: It is my clinical opinion that referral for OBSERVATION is reasonable and necessary in this patient based on the above information provided. The aforementioned taken together are felt to place the patient at high risk for further clinical deterioration. However it is anticipated that the patient may be medically stable for discharge from the hospital within 24 to 48 hours.  The medical decision making on this patient was of high complexity and the patient is at high risk for clinical deterioration, therefore this is a level 3 visit.  Shela Leff MD [facility] Pager [phone]  If 7PM-7AM, please contact night-coverage [email or web address] Password [email or web address]  09/12/2019, 12:13 AM

## 2019-09-11 NOTE — ED Notes (Signed)
Off the floor to CT 

## 2019-09-11 NOTE — ED Notes (Signed)
Pt. Alert and oriented. IV infusing well IV sight looks good. Pt. Denies any pain or nausea at this time and does not want pain or nauseas medication at this time

## 2019-09-11 NOTE — ED Notes (Signed)
Pt. Continues to move hand and stop fluid infusion. Inspected Iv along with physician and all looks good. Pt. Is comfortable and vitals WDL

## 2019-09-11 NOTE — ED Notes (Signed)
Patient transported to X-ray 

## 2019-09-11 NOTE — ED Triage Notes (Signed)
Pt from home via ems; unwitnessed mechanical fall last night, tripped over doorframe; down for approx 18 hours; hit head, denies loc, denies dizziness; found by son who called ems; only complaint R head pain, L shoulder pain, some sob; no neck or back pain; per son, pt is "out of it" A and O x 2, doesn't know year or city; not on thinners; 1 episode of vomiting; able to standand pivot, bears weight on both legs; hx stroke, drop foot left side  95% RA 160/80 107 HR 16 RR CBG 103 96.4 T

## 2019-09-12 DIAGNOSIS — E872 Acidosis, unspecified: Secondary | ICD-10-CM | POA: Diagnosis present

## 2019-09-12 DIAGNOSIS — N179 Acute kidney failure, unspecified: Secondary | ICD-10-CM | POA: Diagnosis present

## 2019-09-12 DIAGNOSIS — E871 Hypo-osmolality and hyponatremia: Secondary | ICD-10-CM | POA: Diagnosis present

## 2019-09-12 DIAGNOSIS — G9341 Metabolic encephalopathy: Secondary | ICD-10-CM | POA: Diagnosis present

## 2019-09-12 DIAGNOSIS — T796XXA Traumatic ischemia of muscle, initial encounter: Secondary | ICD-10-CM | POA: Diagnosis not present

## 2019-09-12 LAB — RAPID URINE DRUG SCREEN, HOSP PERFORMED
Amphetamines: NOT DETECTED
Barbiturates: NOT DETECTED
Benzodiazepines: NOT DETECTED
Cocaine: NOT DETECTED
Opiates: NOT DETECTED
Tetrahydrocannabinol: NOT DETECTED

## 2019-09-12 LAB — URINALYSIS, ROUTINE W REFLEX MICROSCOPIC
Bacteria, UA: NONE SEEN
Bilirubin Urine: NEGATIVE
Glucose, UA: NEGATIVE mg/dL
Hgb urine dipstick: NEGATIVE
Ketones, ur: 5 mg/dL — AB
Leukocytes,Ua: NEGATIVE
Nitrite: NEGATIVE
Protein, ur: >=300 mg/dL — AB
Specific Gravity, Urine: 1.015 (ref 1.005–1.030)
pH: 6 (ref 5.0–8.0)

## 2019-09-12 LAB — CBC
HCT: 27 % — ABNORMAL LOW (ref 39.0–52.0)
Hemoglobin: 9 g/dL — ABNORMAL LOW (ref 13.0–17.0)
MCH: 31.8 pg (ref 26.0–34.0)
MCHC: 33.3 g/dL (ref 30.0–36.0)
MCV: 95.4 fL (ref 80.0–100.0)
Platelets: 465 10*3/uL — ABNORMAL HIGH (ref 150–400)
RBC: 2.83 MIL/uL — ABNORMAL LOW (ref 4.22–5.81)
RDW: 12.4 % (ref 11.5–15.5)
WBC: 8.9 10*3/uL (ref 4.0–10.5)
nRBC: 0 % (ref 0.0–0.2)

## 2019-09-12 LAB — COMPREHENSIVE METABOLIC PANEL
ALT: 47 U/L — ABNORMAL HIGH (ref 0–44)
AST: 125 U/L — ABNORMAL HIGH (ref 15–41)
Albumin: 2.1 g/dL — ABNORMAL LOW (ref 3.5–5.0)
Alkaline Phosphatase: 54 U/L (ref 38–126)
Anion gap: 8 (ref 5–15)
BUN: 23 mg/dL (ref 8–23)
CO2: 21 mmol/L — ABNORMAL LOW (ref 22–32)
Calcium: 9 mg/dL (ref 8.9–10.3)
Chloride: 102 mmol/L (ref 98–111)
Creatinine, Ser: 1.47 mg/dL — ABNORMAL HIGH (ref 0.61–1.24)
GFR calc Af Amer: 55 mL/min — ABNORMAL LOW (ref >60)
GFR calc non Af Amer: 47 mL/min — ABNORMAL LOW (ref >60)
Glucose, Bld: 95 mg/dL (ref 70–99)
Potassium: 3.6 mmol/L (ref 3.5–5.1)
Sodium: 131 mmol/L — ABNORMAL LOW (ref 135–145)
Total Bilirubin: 0.3 mg/dL (ref 0.3–1.2)
Total Protein: 5.1 g/dL — ABNORMAL LOW (ref 6.5–8.1)

## 2019-09-12 LAB — SARS CORONAVIRUS 2 (TAT 6-24 HRS): SARS Coronavirus 2: NEGATIVE

## 2019-09-12 LAB — TSH: TSH: 3.292 u[IU]/mL (ref 0.350–4.500)

## 2019-09-12 LAB — CK: Total CK: 2034 U/L — ABNORMAL HIGH (ref 49–397)

## 2019-09-12 LAB — VITAMIN B12: Vitamin B-12: 356 pg/mL (ref 180–914)

## 2019-09-12 LAB — AMMONIA: Ammonia: 10 umol/L (ref 9–35)

## 2019-09-12 MED ORDER — ALBUTEROL SULFATE (2.5 MG/3ML) 0.083% IN NEBU
2.5000 mg | INHALATION_SOLUTION | Freq: Three times a day (TID) | RESPIRATORY_TRACT | Status: DC
  Administered 2019-09-12 – 2019-09-13 (×2): 2.5 mg via RESPIRATORY_TRACT
  Filled 2019-09-12 (×2): qty 3

## 2019-09-12 MED ORDER — ALBUTEROL SULFATE (2.5 MG/3ML) 0.083% IN NEBU
2.5000 mg | INHALATION_SOLUTION | RESPIRATORY_TRACT | Status: DC
  Administered 2019-09-12: 2.5 mg via RESPIRATORY_TRACT
  Filled 2019-09-12: qty 3

## 2019-09-12 MED ORDER — PAROXETINE HCL 20 MG PO TABS
20.0000 mg | ORAL_TABLET | Freq: Every evening | ORAL | Status: DC
  Administered 2019-09-12 – 2019-09-20 (×9): 20 mg via ORAL
  Filled 2019-09-12 (×10): qty 1

## 2019-09-12 MED ORDER — METOPROLOL TARTRATE 5 MG/5ML IV SOLN
5.0000 mg | INTRAVENOUS | Status: AC | PRN
  Administered 2019-09-13 – 2019-09-14 (×2): 5 mg via INTRAVENOUS
  Filled 2019-09-12 (×3): qty 5

## 2019-09-12 MED ORDER — SODIUM CHLORIDE 0.9 % IV SOLN: INTRAVENOUS | Status: DC

## 2019-09-12 NOTE — Plan of Care (Signed)
  Problem: Education: Goal: Knowledge of General Education information will improve Description: Including pain rating scale, medication(s)/side effects and non-pharmacologic comfort measures Outcome: Progressing   Problem: Health Behavior/Discharge Planning: Goal: Ability to manage health-related needs will improve Outcome: Progressing   Problem: Clinical Measurements: Goal: Will remain free from infection Outcome: Progressing   Problem: Elimination: Goal: Will not experience complications related to bowel motility Outcome: Progressing   Problem: Pain Managment: Goal: General experience of comfort will improve Outcome: Progressing   Problem: Safety: Goal: Ability to remain free from injury will improve Outcome: Progressing   Problem: Skin Integrity: Goal: Risk for impaired skin integrity will decrease Outcome: Progressing

## 2019-09-12 NOTE — Progress Notes (Signed)
Pt admited from ER via stretcher. Pt able to walk from hallway to his room with some assistance. Chronic cough noted. Pt oriented to the floor and room. Pt is resting and call bell is within reach. Will continue to monitor.

## 2019-09-12 NOTE — Progress Notes (Signed)
Progress Note    Clarance Thaggard  [id number] DOB: 11-29-1945  DOA: 09/11/2019 PCP: Patient, No Pcp Per    Brief Narrative:     Medical records reviewed and are as summarized below:  Nihit Hoying is an 73 y.o. 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  Patient states he tripped over a door frame and fell on the ground.  He hit his head but did not lose consciousness.  He was down on the ground for approximately 18 hours.  He was found by his son who called EMS.    Assessment/Plan:   Principal Problem:   Rhabdomyolysis Active Problems:   AKI (acute kidney injury) ([place])   Acute metabolic encephalopathy   Chronic hyponatremia   Metabolic acidosis   Rhabdomyolysis Patient had a mechanical fall at home and was down on the ground for 18 hours.   -CK elevated at 4536.  - Appears very dehydrated still even after 2 L normal saline boluses.   -Continue IV fluid hydration. -Continue to trend CK  AKI Likely secondary to rhabdomyolysis and dehydration. BUN 24, creatinine 1.4.  Creatinine was 0.9 previously. -IV fluid hydration -Continue to monitor renal function -Monitor urine output  Mechanical fall -CT head, CT C-spine, pelvic x-ray, and left shoulder x-ray negative for acute finding.  Not complaining of any pain and appears comfortable on exam. -PT evaluation  Acute metabolic encephalopathy -baseline mental status is not clear-- lives alone  -he is able to answer simple questions -UA does not reflect UTI -ammonia, TSH, B12 levels wnl  Chronic hyponatremia -trending up with IV fluid hydration -Continue to monitor sodium level  Mild transaminitis -Suspect related to rhabdomyolysis.   -IV fluid hydration for rhabdomyolysis  Tobacco use Smokes 3 cigarettes/day. -Counseling -NicoDerm patch   Family  Communication/Anticipated D/C date and plan/Code Status   DVT prophylaxis: Lovenox ordered. Code Status: Full Code.  Family Communication: unable to reach family (son) Disposition Plan: needs continue IVF and PT eval along with safe d/c plan   Medical Consultants:    None.     Subjective:   C/o being hungry  Objective:    Vitals:   09/11/19 2300 09/12/19 0048 09/12/19 0414 09/12/19 1125  BP: 103/63 109/62 (!) 102/59   Pulse: 98 98 96   Resp: (!) 25     Temp:  98.9 F (37.2 C) 99.4 F (37.4 C)   TempSrc:  Oral Oral   SpO2: 99% 98% 96% 96%  Weight:      Height:        Intake/Output Summary (Last 24 hours) at 09/12/2019 1331 Last data filed at 09/12/2019 0655 Gross per 24 hour  Intake 1266.86 ml  Output 200 ml  Net 1066.86 ml   Filed Weights   09/11/19 1818  Weight: 54.4 kg    Exam: In bed, chronically ill appearing rrr +BS, soft, NT Left hand swollen but had equal grip   Data Reviewed:   I have personally reviewed following labs and imaging studies:  Labs: Labs show the following:   Basic Metabolic Panel: Recent Labs  Lab 09/11/19 1838 09/12/19 0652  NA 129* 131*  K 4.5 3.6  CL 96* 102  CO2 19* 21*  GLUCOSE 105* 95  BUN 24* 23  CREATININE 1.45* 1.47*  CALCIUM 10.4* 9.0   GFR Estimated Creatinine Clearance: 35.5 mL/min (A) (by C-G  formula based on SCr of 1.47 mg/dL (H)). Liver Function Tests: Recent Labs  Lab 09/11/19 1838 09/12/19 0652  AST 194* 125*  ALT 58* 47*  ALKPHOS 72 54  BILITOT 0.5 0.3  PROT 7.0 5.1*  ALBUMIN 2.8* 2.1*   No results for input(s): LIPASE, AMYLASE in the last 168 hours. Recent Labs  Lab 09/12/19 0652  AMMONIA 10   Coagulation profile No results for input(s): INR, PROTIME in the last 168 hours.  CBC: Recent Labs  Lab 09/11/19 1838 09/12/19 0652  WBC 12.9* 8.9  NEUTROABS 10.9*  --   HGB 11.7* 9.0*  HCT 33.5* 27.0*  MCV 93.8 95.4  PLT 532* 465*   Cardiac Enzymes: Recent Labs  Lab  09/11/19 1838 09/12/19 0652  CKTOTAL 4,536* 2,034*   BNP (last 3 results) No results for input(s): PROBNP in the last 8760 hours. CBG: No results for input(s): GLUCAP in the last 168 hours. D-Dimer: No results for input(s): DDIMER in the last 72 hours. Hgb A1c: No results for input(s): HGBA1C in the last 72 hours. Lipid Profile: No results for input(s): CHOL, HDL, LDLCALC, TRIG, CHOLHDL, LDLDIRECT in the last 72 hours. Thyroid function studies: Recent Labs    09/12/19 0652  TSH 3.292   Anemia work up: Recent Labs    09/12/19 0652  VITAMINB12 356   Sepsis Labs: Recent Labs  Lab 09/11/19 1838 09/12/19 0652  WBC 12.9* 8.9    Microbiology Recent Results (from the past 240 hour(s))  SARS CORONAVIRUS 2 (TAT 6-24 HRS) Nasopharyngeal Nasopharyngeal Swab     Status: None   Collection Time: 09/11/19 11:18 PM   Specimen: Nasopharyngeal Swab  Result Value Ref Range Status   SARS Coronavirus 2 NEGATIVE NEGATIVE Final    Comment: (NOTE) SARS-CoV-2 target nucleic acids are NOT DETECTED. The SARS-CoV-2 RNA is generally detectable in upper and lower respiratory specimens during the acute phase of infection. Negative results do not preclude SARS-CoV-2 infection, do not rule out co-infections with other pathogens, and should not be used as the sole basis for treatment or other patient management decisions. Negative results must be combined with clinical observations, patient history, and epidemiological information. The expected result is Negative. Fact Sheet for Patients: SugarRoll.be Fact Sheet for Healthcare Providers: https://www.woods-mathews.com/ This test is not yet approved or cleared by the [place] FDA and  has been authorized for detection and/or diagnosis of SARS-CoV-2 by FDA under an Emergency Use Authorization (EUA). This EUA will remain  in effect (meaning this test can be used) for the duration of the COVID-19  declaration under Section 56 4(b)(1) of the Act, 21 U.S.C. section 360bbb-3(b)(1), unless the authorization is terminated or revoked sooner. Performed at [facility], [place] [address] [place], [place] [address]     Procedures and diagnostic studies:  DG Chest 2 View  Result Date: 09/11/2019 CLINICAL DATA:  Fall with chest injury and pain. EXAM: CHEST - 2 VIEW COMPARISON:  06/15/2011 FINDINGS: The cardiomediastinal silhouette is unremarkable. Mild chronic peribronchial thickening again noted. There is no evidence of focal airspace disease, pulmonary edema, suspicious pulmonary nodule/mass, pleural effusion, or pneumothorax. No acute bony abnormalities are identified. Surgical changes in the LEFT scapula noted. IMPRESSION: No active cardiopulmonary disease. Electronically Signed   By: Margarette Canada M.D.   On: 09/11/2019 19:45   DG Pelvis 1-2 Views  Result Date: 09/11/2019 CLINICAL DATA:  Fall, pain EXAM: PELVIS - 1-2 VIEW COMPARISON:  None. FINDINGS: Hip joints and SI joints are symmetric and unremarkable.  No acute bony abnormality. Specifically, no fracture, subluxation, or dislocation. Postoperative changes in the lower lumbar spine. IMPRESSION: No acute bony abnormality. Electronically Signed   By: Rolm Baptise M.D.   On: 09/11/2019 19:27   CT Head Wo Contrast  Result Date: 09/11/2019 CLINICAL DATA:  Right-sided head pain after falling and striking head. EXAM: CT HEAD WITHOUT CONTRAST TECHNIQUE: Contiguous axial images were obtained from the base of the skull through the vertex without intravenous contrast. COMPARISON:  CT head 06/15/2019. FINDINGS: Brain: There is no evidence of acute intracranial hemorrhage, mass lesion, brain edema or extra-axial fluid collection. There is stable atrophy with prominence of the ventricles and subarachnoid spaces. There are chronic small vessel ischemic changes in the periventricular white matter and basal ganglia bilaterally. There is no CT evidence of  acute cortical infarction. Vascular: Intracranial vascular calcifications. No hyperdense vessel identified. Skull: Negative for fracture or focal lesion. Sinuses/Orbits: The visualized paranasal sinuses are clear. There is a chronic mastoid effusion on the right with partial opacification of right middle ear. The mastoid air cells and middle ear on the left are clear. Other: None. IMPRESSION: 1. No acute intracranial or calvarial findings. 2. Stable atrophy and chronic small vessel ischemic changes. 3. Chronic right mastoid effusion and partial right middle ear opacification. Electronically Signed   By: Richardean Sale M.D.   On: 09/11/2019 20:43   CT Cervical Spine Wo Contrast  Result Date: 09/11/2019 CLINICAL DATA:  Mechanical fall in the intra way, impact to head and concrete floor. EXAM: CT CERVICAL SPINE WITHOUT CONTRAST TECHNIQUE: Multidetector CT imaging of the cervical spine was performed without intravenous contrast. Multiplanar CT image reconstructions were also generated. COMPARISON:  06/15/2019 FINDINGS: Alignment: Normal. Skull base and vertebrae: No acute fracture. No primary bone lesion or focal pathologic process. Soft tissues and spinal canal: No prevertebral fluid or swelling. No visible canal hematoma. Atherosclerotic calcifications in the bilateral carotid arteries. Disc levels: Multilevel spinal degenerative changes without change from the previous exam. Upper chest: Signs of emphysema at the lung apices as before. Some apical scarring as well. Other: None IMPRESSION: 1. Spinal degenerative changes without acute fracture of the cervical spine. 2. Pulmonary emphysema. 3. Incidental note again made of atherosclerotic calcifications, dense calcifications of the carotid bulbs bilaterally. Electronically Signed   By: Zetta Bills M.D.   On: 09/11/2019 20:45   DG Shoulder Left  Result Date: 09/11/2019 CLINICAL DATA:  Pain, fall. EXAM: LEFT SHOULDER - 2+ VIEW COMPARISON:  Chest x-ray  06/15/2019 FINDINGS: Screw Mains in place within the scapula. Glenohumeral joint is located with signs of degenerative change. No signs of acute fracture or dislocation. IMPRESSION: 1. Postoperative changes about the left shoulder, no signs of acute fracture. Electronically Signed   By: Zetta Bills M.D.   On: 09/11/2019 19:25    Medications:   . albuterol  2.5 mg Inhalation TID  . enoxaparin (LOVENOX) injection  40 mg Subcutaneous Daily  . nicotine  7 mg Transdermal Daily  . PARoxetine  20 mg Oral QPM   Continuous Infusions: . sodium chloride       LOS: 0 days   Geradine Girt  [facility]   How to contact the [facility] Attending or Consulting provider [place] or covering provider during after hours [place], for this patient?  1. Check the care team in [facility] and look for a) attending/consulting TR[facility] provider listed and b) the [facility] team listed 2. Log into [email or web address] and use [place]'s universal password  to access. If you do not have the password, please contact the hospital operator. 3. Locate the [facility] provider you are looking for under [facility] and page to a number that you can be directly reached. 4. If you still have difficulty reaching the provider, please page the [facility] (Director on Call) for the Hospitalists listed on amion for assistance.  09/12/2019, 1:31 PM

## 2019-09-12 NOTE — Plan of Care (Signed)
  Problem: Education: Goal: Knowledge of General Education information will improve Description: Including pain rating scale, medication(s)/side effects and non-pharmacologic comfort measures Outcome: Progressing   Problem: Clinical Measurements: Goal: Diagnostic test results will improve Outcome: Progressing Goal: Respiratory complications will improve Outcome: Progressing   Problem: Activity: Goal: Risk for activity intolerance will decrease Outcome: Progressing   Problem: Pain Managment: Goal: General experience of comfort will improve Outcome: Progressing   Problem: Safety: Goal: Ability to remain free from injury will improve Outcome: Progressing   Problem: Skin Integrity: Goal: Risk for impaired skin integrity will decrease Outcome: Progressing

## 2019-09-12 NOTE — Evaluation (Signed)
Physical Therapy Evaluation Patient Details Name: Anthony Andrews MRN: [id number] DOB: [birth year] Today's Date: 09/12/2019   History of Present Illness  Pt is a 73 y/o male admitted after falling at home. All imaging was negative for any acute findings; however, pt found to have Rhabdomyolysis after reportedly lying on the ground for 18 hours. PMH including but not limited to prior CVA with residual L sided weakness.    Clinical Impression  Pt presented supine in bed with HOB elevated, awake and willing to participate in therapy session. Prior to admission, pt reported that he ambulated with use of a RW and was independent with ADLs. Pt lives alone in a single level home with several steps to enter. At the time of evaluation, pt greatly limited secondary to weakness and fatigue. He required min A for bed mobility, min guard for transfers and min A to ambulate a very short distance within his room with RW. Pt would greatly benefit from short-term rehab at a SNF prior to returning home alone. Pt would continue to benefit from skilled physical therapy services at this time while admitted and after d/c to address the below listed limitations in order to improve overall safety and independence with functional mobility.     Follow Up Recommendations SNF (unless family can provide 24/7 supervision assistance to be able to return home with HHPT)    Equipment Recommendations  None recommended by PT    Recommendations for Other Services       Precautions / Restrictions Precautions Precautions: Fall Restrictions Weight Bearing Restrictions: No      Mobility  Bed Mobility Overal bed mobility: Needs Assistance Bed Mobility: Supine to Sit     Supine to sit: Min assist     General bed mobility comments: increased time and effort, min A for trunk elevation  Transfers Overall transfer level: Needs assistance Equipment used: Rolling walker (2 wheeled) Transfers: Sit to/from Stand Sit to Stand:  Min guard         General transfer comment: cueing for safe hand placement, min guard for safety  Ambulation/Gait Ambulation/Gait assistance: Min assist Gait Distance (Feet): 5 Feet Assistive device: Rolling walker (2 wheeled) Gait Pattern/deviations: Step-to pattern;Decreased step length - right;Decreased step length - left;Decreased stride length;Shuffle;Trunk flexed Gait velocity: decreased   General Gait Details: pt with mild instability requiring min A for support and use of RW; limited secondary to fatigue and weakness  Stairs            Wheelchair Mobility    Modified Rankin (Stroke Patients Only)       Balance Overall balance assessment: Needs assistance Sitting-balance support: Feet supported Sitting balance-Leahy Scale: Fair     Standing balance support: Bilateral upper extremity supported;Single extremity supported Standing balance-Leahy Scale: Poor                               Pertinent Vitals/Pain Pain Assessment: No/denies pain    Home Living Family/patient expects to be discharged to:: Private residence Living Arrangements: Alone Available Help at Discharge: Family;Available PRN/intermittently Type of Home: House Home Access: Stairs to enter Entrance Stairs-Rails: Right Entrance Stairs-Number of Steps: 5 Home Layout: One level Home Equipment: Clinical cytogeneticist - 2 wheels      Prior Function Level of Independence: Independent with assistive device(s)         Comments: ambulates with a RW; son drives     Hand Dominance  Extremity/Trunk Assessment   Upper Extremity Assessment Upper Extremity Assessment: Generalized weakness    Lower Extremity Assessment Lower Extremity Assessment: Generalized weakness    Cervical / Trunk Assessment Cervical / Trunk Assessment: Kyphotic  Communication   Communication: No difficulties  Cognition Arousal/Alertness: Awake/alert Behavior During Therapy: WFL for tasks  assessed/performed Overall Cognitive Status: Impaired/Different from baseline Area of Impairment: Safety/judgement                         Safety/Judgement: Decreased awareness of safety;Decreased awareness of deficits            General Comments      Exercises     Assessment/Plan    PT Assessment Patient needs continued PT services  PT Problem List Decreased strength;Decreased activity tolerance;Decreased balance;Decreased mobility;Decreased coordination;Decreased knowledge of use of DME;Decreased safety awareness;Decreased knowledge of precautions       PT Treatment Interventions DME instruction;Gait training;Stair training;Functional mobility training;Therapeutic activities;Balance training;Therapeutic exercise;Neuromuscular re-education;Patient/family education    PT Goals (Current goals can be found in the Care Plan section)  Acute Rehab PT Goals Patient Stated Goal: none stated PT Goal Formulation: With patient Time For Goal Achievement: 09/26/19 Potential to Achieve Goals: Good    Frequency Min 2X/week   Barriers to discharge Decreased caregiver support      Co-evaluation               AM-PAC PT "6 Clicks" Mobility  Outcome Measure Help needed turning from your back to your side while in a flat bed without using bedrails?: A Little Help needed moving from lying on your back to sitting on the side of a flat bed without using bedrails?: A Little Help needed moving to and from a bed to a chair (including a wheelchair)?: A Little Help needed standing up from a chair using your arms (e.g., wheelchair or bedside chair)?: A Little Help needed to walk in hospital room?: A Little Help needed climbing 3-5 steps with a railing? : A Lot 6 Click Score: 17    End of Session Equipment Utilized During Treatment: Gait belt Activity Tolerance: Patient tolerated treatment well Patient left: in chair;with call bell/phone within reach;with chair alarm  set Nurse Communication: Mobility status PT Visit Diagnosis: Other abnormalities of gait and mobility (R26.89);Muscle weakness (generalized) (M62.81)    Time: [id number] PT Time Calculation (min) (ACUTE ONLY): 22 min   Charges:   PT Evaluation $PT Eval Moderate Complexity: 1 Mod          Eduard Clos, PT, DPT  Acute Rehabilitation Services Pager [phone] Office [place] 09/12/2019, 3:06 PM

## 2019-09-13 DIAGNOSIS — J439 Emphysema, unspecified: Secondary | ICD-10-CM | POA: Diagnosis present

## 2019-09-13 DIAGNOSIS — I471 Supraventricular tachycardia: Secondary | ICD-10-CM | POA: Diagnosis present

## 2019-09-13 DIAGNOSIS — I4891 Unspecified atrial fibrillation: Secondary | ICD-10-CM | POA: Diagnosis not present

## 2019-09-13 DIAGNOSIS — E782 Mixed hyperlipidemia: Secondary | ICD-10-CM | POA: Diagnosis not present

## 2019-09-13 DIAGNOSIS — I69354 Hemiplegia and hemiparesis following cerebral infarction affecting left non-dominant side: Secondary | ICD-10-CM | POA: Diagnosis not present

## 2019-09-13 DIAGNOSIS — E1122 Type 2 diabetes mellitus with diabetic chronic kidney disease: Secondary | ICD-10-CM | POA: Diagnosis present

## 2019-09-13 DIAGNOSIS — N179 Acute kidney failure, unspecified: Secondary | ICD-10-CM | POA: Diagnosis present

## 2019-09-13 DIAGNOSIS — Z716 Tobacco abuse counseling: Secondary | ICD-10-CM | POA: Diagnosis not present

## 2019-09-13 DIAGNOSIS — Z7982 Long term (current) use of aspirin: Secondary | ICD-10-CM | POA: Diagnosis not present

## 2019-09-13 DIAGNOSIS — E871 Hypo-osmolality and hyponatremia: Secondary | ICD-10-CM | POA: Diagnosis present

## 2019-09-13 DIAGNOSIS — D631 Anemia in chronic kidney disease: Secondary | ICD-10-CM | POA: Diagnosis present

## 2019-09-13 DIAGNOSIS — I10 Essential (primary) hypertension: Secondary | ICD-10-CM | POA: Diagnosis not present

## 2019-09-13 DIAGNOSIS — I6522 Occlusion and stenosis of left carotid artery: Secondary | ICD-10-CM | POA: Diagnosis not present

## 2019-09-13 DIAGNOSIS — R011 Cardiac murmur, unspecified: Secondary | ICD-10-CM | POA: Diagnosis not present

## 2019-09-13 DIAGNOSIS — Z79899 Other long term (current) drug therapy: Secondary | ICD-10-CM | POA: Diagnosis not present

## 2019-09-13 DIAGNOSIS — F17210 Nicotine dependence, cigarettes, uncomplicated: Secondary | ICD-10-CM | POA: Diagnosis present

## 2019-09-13 DIAGNOSIS — Z20828 Contact with and (suspected) exposure to other viral communicable diseases: Secondary | ICD-10-CM | POA: Diagnosis present

## 2019-09-13 DIAGNOSIS — M6282 Rhabdomyolysis: Secondary | ICD-10-CM | POA: Diagnosis not present

## 2019-09-13 DIAGNOSIS — J9601 Acute respiratory failure with hypoxia: Secondary | ICD-10-CM | POA: Diagnosis not present

## 2019-09-13 DIAGNOSIS — I129 Hypertensive chronic kidney disease with stage 1 through stage 4 chronic kidney disease, or unspecified chronic kidney disease: Secondary | ICD-10-CM | POA: Diagnosis present

## 2019-09-13 DIAGNOSIS — T796XXA Traumatic ischemia of muscle, initial encounter: Secondary | ICD-10-CM | POA: Diagnosis present

## 2019-09-13 DIAGNOSIS — N182 Chronic kidney disease, stage 2 (mild): Secondary | ICD-10-CM | POA: Diagnosis present

## 2019-09-13 DIAGNOSIS — I358 Other nonrheumatic aortic valve disorders: Secondary | ICD-10-CM | POA: Diagnosis present

## 2019-09-13 DIAGNOSIS — E860 Dehydration: Secondary | ICD-10-CM | POA: Diagnosis present

## 2019-09-13 DIAGNOSIS — Z23 Encounter for immunization: Secondary | ICD-10-CM | POA: Diagnosis present

## 2019-09-13 DIAGNOSIS — Y92009 Unspecified place in unspecified non-institutional (private) residence as the place of occurrence of the external cause: Secondary | ICD-10-CM | POA: Diagnosis not present

## 2019-09-13 DIAGNOSIS — I480 Paroxysmal atrial fibrillation: Secondary | ICD-10-CM | POA: Diagnosis not present

## 2019-09-13 DIAGNOSIS — G9341 Metabolic encephalopathy: Secondary | ICD-10-CM | POA: Diagnosis present

## 2019-09-13 DIAGNOSIS — E872 Acidosis: Secondary | ICD-10-CM | POA: Diagnosis present

## 2019-09-13 DIAGNOSIS — W010XXA Fall on same level from slipping, tripping and stumbling without subsequent striking against object, initial encounter: Secondary | ICD-10-CM | POA: Diagnosis present

## 2019-09-13 DIAGNOSIS — I519 Heart disease, unspecified: Secondary | ICD-10-CM | POA: Diagnosis not present

## 2019-09-13 DIAGNOSIS — D638 Anemia in other chronic diseases classified elsewhere: Secondary | ICD-10-CM | POA: Diagnosis present

## 2019-09-13 DIAGNOSIS — F0150 Vascular dementia without behavioral disturbance: Secondary | ICD-10-CM | POA: Diagnosis present

## 2019-09-13 DIAGNOSIS — J90 Pleural effusion, not elsewhere classified: Secondary | ICD-10-CM | POA: Diagnosis present

## 2019-09-13 LAB — CBC
HCT: 29.5 % — ABNORMAL LOW (ref 39.0–52.0)
Hemoglobin: 10.1 g/dL — ABNORMAL LOW (ref 13.0–17.0)
MCH: 32.1 pg (ref 26.0–34.0)
MCHC: 34.2 g/dL (ref 30.0–36.0)
MCV: 93.7 fL (ref 80.0–100.0)
Platelets: 457 10*3/uL — ABNORMAL HIGH (ref 150–400)
RBC: 3.15 MIL/uL — ABNORMAL LOW (ref 4.22–5.81)
RDW: 12.5 % (ref 11.5–15.5)
WBC: 9.1 10*3/uL (ref 4.0–10.5)
nRBC: 0 % (ref 0.0–0.2)

## 2019-09-13 LAB — COMPREHENSIVE METABOLIC PANEL
ALT: 48 U/L — ABNORMAL HIGH (ref 0–44)
AST: 96 U/L — ABNORMAL HIGH (ref 15–41)
Albumin: 2.4 g/dL — ABNORMAL LOW (ref 3.5–5.0)
Alkaline Phosphatase: 58 U/L (ref 38–126)
Anion gap: 10 (ref 5–15)
BUN: 24 mg/dL — ABNORMAL HIGH (ref 8–23)
CO2: 17 mmol/L — ABNORMAL LOW (ref 22–32)
Calcium: 9.5 mg/dL (ref 8.9–10.3)
Chloride: 103 mmol/L (ref 98–111)
Creatinine, Ser: 1.37 mg/dL — ABNORMAL HIGH (ref 0.61–1.24)
GFR calc Af Amer: 60 mL/min — ABNORMAL LOW (ref >60)
GFR calc non Af Amer: 52 mL/min — ABNORMAL LOW (ref >60)
Glucose, Bld: 87 mg/dL (ref 70–99)
Potassium: 3.7 mmol/L (ref 3.5–5.1)
Sodium: 130 mmol/L — ABNORMAL LOW (ref 135–145)
Total Bilirubin: 0.4 mg/dL (ref 0.3–1.2)
Total Protein: 5.7 g/dL — ABNORMAL LOW (ref 6.5–8.1)

## 2019-09-13 LAB — CK: Total CK: 1002 U/L — ABNORMAL HIGH (ref 49–397)

## 2019-09-13 MED ORDER — ALBUTEROL SULFATE (2.5 MG/3ML) 0.083% IN NEBU
2.5000 mg | INHALATION_SOLUTION | RESPIRATORY_TRACT | Status: DC | PRN
  Administered 2019-09-14: 16:00:00 2.5 mg via RESPIRATORY_TRACT
  Filled 2019-09-13: qty 3

## 2019-09-13 MED ORDER — ENSURE ENLIVE PO LIQD
237.0000 mL | Freq: Two times a day (BID) | ORAL | Status: DC
  Administered 2019-09-13 – 2019-09-14 (×4): 237 mL via ORAL

## 2019-09-13 MED ORDER — SODIUM CHLORIDE 0.9 % IV SOLN: INTRAVENOUS | Status: DC

## 2019-09-13 NOTE — NC FL2 (Signed)
[place] LEVEL OF CARE SCREENING TOOL     IDENTIFICATION  Patient Name: Anthony Andrews Birthdate: Mar 21, 1946 Sex: male Admission Date (Current Location): 09/11/2019  [facility] and [place] Number:  Herbalist and Address:  The [place], [place] [address] [place], [place] [address]      Provider Number: [id number]  Attending Physician Name and Address:  Darliss Cheney, MD  Relative Name and Phone Number:  Iona Beard [phone]    Current Level of Care: Hospital Recommended Level of Care: [place] Prior Approval Number:    Date Approved/Denied:   PASRR Number: PASRR under review  Discharge Plan: SNF    Current Diagnoses: Patient Active Problem List   Diagnosis Date Noted  . AKI (acute kidney injury) ([place]) 09/12/2019  . Acute metabolic encephalopathy [id number]  . Chronic hyponatremia 09/12/2019  . Metabolic acidosis [id number]  . Rhabdomyolysis 09/11/2019    Orientation RESPIRATION BLADDER Height & Weight     Self, Time  Normal Continent Weight: 120 lb (54.4 kg) Height:  5\' 3"  (160 cm)  BEHAVIORAL SYMPTOMS/MOOD NEUROLOGICAL BOWEL NUTRITION STATUS      Continent Diet(Cardiac)  AMBULATORY STATUS COMMUNICATION OF NEEDS Skin   Limited Assist Verbally Skin abrasions                       Personal Care Assistance Level of Assistance  Bathing, Feeding, Dressing Bathing Assistance: Limited assistance Feeding assistance: Independent Dressing Assistance: Limited assistance     Functional Limitations Info  Sight, Hearing, Speech Sight Info: Adequate Hearing Info: Adequate Speech Info: Adequate    SPECIAL CARE FACTORS FREQUENCY  PT (By licensed PT), OT (By licensed OT)     PT Frequency: 5X per week OT Frequency: 5x per week            Contractures Contractures Info: Not present    Additional Factors Info  Code Status, Allergies, Isolation Precautions Code Status Info: Full Allergies Info:  NKA     Isolation Precautions Info: High fall risk     Current Medications (09/13/2019):  This is the current hospital active medication list Current Facility-Administered Medications  Medication Dose Route Frequency Provider Last Rate Last Admin  . 0.9 %  sodium chloride infusion   Intravenous Continuous Darliss Cheney, MD 125 mL/hr at 09/13/19 1252 New Bag at 09/13/19 1252  . acetaminophen (TYLENOL) tablet 650 mg  650 mg Oral Q6H PRN Shela Leff, MD       Or  . acetaminophen (TYLENOL) suppository 650 mg  650 mg Rectal Q6H PRN Shela Leff, MD      . albuterol (PROVENTIL) (2.5 MG/3ML) 0.083% nebulizer solution 2.5 mg  2.5 mg Inhalation Q4H PRN Darliss Cheney, MD      . enoxaparin (LOVENOX) injection 40 mg  40 mg Subcutaneous Daily Shela Leff, MD   40 mg at 09/13/19 0917  . feeding supplement (ENSURE ENLIVE) (ENSURE ENLIVE) liquid 237 mL  237 mL Oral BID BM Darliss Cheney, MD   237 mL at 09/13/19 0917  . metoprolol tartrate (LOPRESSOR) injection 5 mg  5 mg Intravenous Q5 min PRN Blount, Scarlette Shorts T, NP   5 mg at 09/13/19 0017  . nicotine (NICODERM CQ - dosed in mg/24 hr) patch 7 mg  7 mg Transdermal Daily Shela Leff, MD   7 mg at 09/12/19 0128  . PARoxetine (PAXIL) tablet 20 mg  20 mg Oral QPM Vann, Jessica U, DO   20 mg at  09/12/19 1824     Discharge Medications: Please see discharge summary for a list of discharge medications.  Relevant Imaging Results:  Relevant Lab Results:   Additional Information ss# [id number] [place], [place]

## 2019-09-13 NOTE — Progress Notes (Signed)
Progress Note    Anthony Andrews  [id number] DOB: [birth year]  DOA: 09/11/2019 PCP: Patient, No Pcp Per    Brief Narrative:     Medical records reviewed and are as summarized below:  Anthony Andrews is an 73 y.o. 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  Patient states he tripped over a door frame and fell on the ground.  He hit his head but did not lose consciousness.  He was down on the ground for approximately 18 hours.  He was found by his son who called EMS.    Assessment/Plan:   Principal Problem:   Rhabdomyolysis Active Problems:   AKI (acute kidney injury) ([place])   Acute metabolic encephalopathy   Chronic hyponatremia   Metabolic acidosis   Rhabdomyolysis Patient had a mechanical fall at home and was down on the ground for 18 hours.   -CK elevated at 4536 at the time of admission, currently down TO 1000.  Not on any IV fluids.  Will resume back with normal saline at 125 cc/h.  Recheck CK tomorrow..   AKI Likely secondary to rhabdomyolysis and dehydration. BUN 24, creatinine 1.4.  Creatinine was 0.9 previously.  Slightly improved today but not back to baseline yet.  Resume fluids.  Recheck labs in the morning.  Fair urine output.  Mechanical fall -CT head, CT C-spine, pelvic x-ray, and left shoulder x-ray negative for acute finding.  Not complaining of any pain and appears comfortable on exam. -Seen by PT.  They recommend SNF.  He lives with his wife.  Not sure if she is going to be able to provide 24-hour care.  After discussing to patient, he is agreeable with going to rehab.  I also talked to his son who is agreeable with making arrangements for SNF.  He prefers that he goes to [facility] as his wife is also currently rehabbing at that place.  Will consult case manager.  Acute metabolic encephalopathy -Talked to  patient's son [name], according to him patient is mostly alert and oriented at baseline with some memory issues since he had a stroke in the past.  Currently patient is alert but oriented x2.  I confirmed with his son that patient's wife is at [facility] rehabilitation since Thanksgiving due to her hip surgery and that he is living by himself at this point in time.  Ammonia, TSH, B12 levels are within normal limits.  No indications of UTI.   Chronic hyponatremia At his baseline at 130.  No symptoms.  Mild transaminitis -Suspect related to rhabdomyolysis.   -IV fluid hydration for rhabdomyolysis.  Numbers improving.  Tobacco use Smokes 3 cigarettes/day. -Counseling -NicoDerm patch   Family Communication/Anticipated D/C date and plan/Code Status   DVT prophylaxis: Lovenox  Code Status: Full Code.  Family Communication: Discussed plan of care with patient son Anthony Andrews. Disposition Plan: needs continue IVF and SNF.  Consulted case Freight forwarder.   Medical Consultants:    None.     Subjective:   Seen and examined.  Alert and oriented x2.  No complaints.  He thinks he is at home.  Objective:    Vitals:   09/13/19 0226 09/13/19 0607 09/13/19 0752 09/13/19 0854  BP: 102/74  117/72   Pulse: (!) 101 94 94   Resp:  17 16   Temp: 99.5 F (37.5 C)  98.6 F (37  C)   TempSrc: Oral  Oral   SpO2: 97%  100% 96%  Weight:      Height:        Intake/Output Summary (Last 24 hours) at 09/13/2019 1045 Last data filed at 09/13/2019 0900 Gross per 24 hour  Intake 313.27 ml  Output 900 ml  Net -586.73 ml   Filed Weights   09/11/19 1818  Weight: 54.4 kg    Exam: General exam: Appears calm and comfortable  Respiratory system: Clear to auscultation. Respiratory effort normal. Cardiovascular system: S1 & S2 heard, RRR. No JVD, murmurs, rubs, gallops or clicks. No pedal edema. Gastrointestinal system: Abdomen is nondistended, soft and nontender. No organomegaly or masses felt.  Normal bowel sounds heard. Central nervous system: Alert and oriented x2. No focal neurological deficits. Extremities: Symmetric 5 x 5 power. Skin: No rashes, lesions or ulcers.  Psychiatry: Judgement and insight appear poor. Mood & affect flat.    Data Reviewed:   I have personally reviewed following labs and imaging studies:  Labs: Labs show the following:   Basic Metabolic Panel: Recent Labs  Lab 09/11/19 1838 09/12/19 0652 09/13/19 0018  NA 129* 131* 130*  K 4.5 3.6 3.7  CL 96* 102 103  CO2 19* 21* 17*  GLUCOSE 105* 95 87  BUN 24* 23 24*  CREATININE 1.45* 1.47* 1.37*  CALCIUM 10.4* 9.0 9.5   GFR Estimated Creatinine Clearance: 38.1 mL/min (A) (by C-G formula based on SCr of 1.37 mg/dL (H)). Liver Function Tests: Recent Labs  Lab 09/11/19 1838 09/12/19 0652 09/13/19 0018  AST 194* 125* 96*  ALT 58* 47* 48*  ALKPHOS 72 54 58  BILITOT 0.5 0.3 0.4  PROT 7.0 5.1* 5.7*  ALBUMIN 2.8* 2.1* 2.4*   No results for input(s): LIPASE, AMYLASE in the last 168 hours. Recent Labs  Lab 09/12/19 0652  AMMONIA 10   Coagulation profile No results for input(s): INR, PROTIME in the last 168 hours.  CBC: Recent Labs  Lab 09/11/19 1838 09/12/19 0652 09/13/19 0018  WBC 12.9* 8.9 9.1  NEUTROABS 10.9*  --   --   HGB 11.7* 9.0* 10.1*  HCT 33.5* 27.0* 29.5*  MCV 93.8 95.4 93.7  PLT 532* 465* 457*   Cardiac Enzymes: Recent Labs  Lab 09/11/19 1838 09/12/19 0652 09/13/19 0018  CKTOTAL 4,536* 2,034* 1,002*   BNP (last 3 results) No results for input(s): PROBNP in the last 8760 hours. CBG: No results for input(s): GLUCAP in the last 168 hours. D-Dimer: No results for input(s): DDIMER in the last 72 hours. Hgb A1c: No results for input(s): HGBA1C in the last 72 hours. Lipid Profile: No results for input(s): CHOL, HDL, LDLCALC, TRIG, CHOLHDL, LDLDIRECT in the last 72 hours. Thyroid function studies: Recent Labs    09/12/19 0652  TSH 3.292   Anemia work  up: Recent Labs    09/12/19 0652  VITAMINB12 356   Sepsis Labs: Recent Labs  Lab 09/11/19 1838 09/12/19 0652 09/13/19 0018  WBC 12.9* 8.9 9.1    Microbiology Recent Results (from the past 240 hour(s))  SARS CORONAVIRUS 2 (TAT 6-24 HRS) Nasopharyngeal Nasopharyngeal Swab     Status: None   Collection Time: 09/11/19 11:18 PM   Specimen: Nasopharyngeal Swab  Result Value Ref Range Status   SARS Coronavirus 2 NEGATIVE NEGATIVE Final    Comment: (NOTE) SARS-CoV-2 target nucleic acids are NOT DETECTED. The SARS-CoV-2 RNA is generally detectable in upper and lower respiratory specimens during the acute phase of infection. Negative results  do not preclude SARS-CoV-2 infection, do not rule out co-infections with other pathogens, and should not be used as the sole basis for treatment or other patient management decisions. Negative results must be combined with clinical observations, patient history, and epidemiological information. The expected result is Negative. Fact Sheet for Patients: SugarRoll.be Fact Sheet for Healthcare Providers: https://www.woods-mathews.com/ This test is not yet approved or cleared by the [place] FDA and  has been authorized for detection and/or diagnosis of SARS-CoV-2 by FDA under an Emergency Use Authorization (EUA). This EUA will remain  in effect (meaning this test can be used) for the duration of the COVID-19 declaration under Section 56 4(b)(1) of the Act, 21 U.S.C. section 360bbb-3(b)(1), unless the authorization is terminated or revoked sooner. Performed at [facility], [place] [address], [place],  [address]     Procedures and diagnostic studies:  DG Chest 2 View  Result Date: 09/11/2019 CLINICAL DATA:  Fall with chest injury and pain. EXAM: CHEST - 2 VIEW COMPARISON:  06/15/2011 FINDINGS: The cardiomediastinal silhouette is unremarkable. Mild chronic peribronchial thickening again  noted. There is no evidence of focal airspace disease, pulmonary edema, suspicious pulmonary nodule/mass, pleural effusion, or pneumothorax. No acute bony abnormalities are identified. Surgical changes in the LEFT scapula noted. IMPRESSION: No active cardiopulmonary disease. Electronically Signed   By: Margarette Canada M.D.   On: 09/11/2019 19:45   DG Pelvis 1-2 Views  Result Date: 09/11/2019 CLINICAL DATA:  Fall, pain EXAM: PELVIS - 1-2 VIEW COMPARISON:  None. FINDINGS: Hip joints and SI joints are symmetric and unremarkable. No acute bony abnormality. Specifically, no fracture, subluxation, or dislocation. Postoperative changes in the lower lumbar spine. IMPRESSION: No acute bony abnormality. Electronically Signed   By: Rolm Baptise M.D.   On: 09/11/2019 19:27   CT Head Wo Contrast  Result Date: 09/11/2019 CLINICAL DATA:  Right-sided head pain after falling and striking head. EXAM: CT HEAD WITHOUT CONTRAST TECHNIQUE: Contiguous axial images were obtained from the base of the skull through the vertex without intravenous contrast. COMPARISON:  CT head 06/15/2019. FINDINGS: Brain: There is no evidence of acute intracranial hemorrhage, mass lesion, brain edema or extra-axial fluid collection. There is stable atrophy with prominence of the ventricles and subarachnoid spaces. There are chronic small vessel ischemic changes in the periventricular white matter and basal ganglia bilaterally. There is no CT evidence of acute cortical infarction. Vascular: Intracranial vascular calcifications. No hyperdense vessel identified. Skull: Negative for fracture or focal lesion. Sinuses/Orbits: The visualized paranasal sinuses are clear. There is a chronic mastoid effusion on the right with partial opacification of right middle ear. The mastoid air cells and middle ear on the left are clear. Other: None. IMPRESSION: 1. No acute intracranial or calvarial findings. 2. Stable atrophy and chronic small vessel ischemic changes. 3.  Chronic right mastoid effusion and partial right middle ear opacification. Electronically Signed   By: Richardean Sale M.D.   On: 09/11/2019 20:43   CT Cervical Spine Wo Contrast  Result Date: 09/11/2019 CLINICAL DATA:  Mechanical fall in the intra way, impact to head and concrete floor. EXAM: CT CERVICAL SPINE WITHOUT CONTRAST TECHNIQUE: Multidetector CT imaging of the cervical spine was performed without intravenous contrast. Multiplanar CT image reconstructions were also generated. COMPARISON:  06/15/2019 FINDINGS: Alignment: Normal. Skull base and vertebrae: No acute fracture. No primary bone lesion or focal pathologic process. Soft tissues and spinal canal: No prevertebral fluid or swelling. No visible canal hematoma. Atherosclerotic calcifications in the bilateral carotid arteries.  Disc levels: Multilevel spinal degenerative changes without change from the previous exam. Upper chest: Signs of emphysema at the lung apices as before. Some apical scarring as well. Other: None IMPRESSION: 1. Spinal degenerative changes without acute fracture of the cervical spine. 2. Pulmonary emphysema. 3. Incidental note again made of atherosclerotic calcifications, dense calcifications of the carotid bulbs bilaterally. Electronically Signed   By: Zetta Bills M.D.   On: 09/11/2019 20:45   DG Shoulder Left  Result Date: 09/11/2019 CLINICAL DATA:  Pain, fall. EXAM: LEFT SHOULDER - 2+ VIEW COMPARISON:  Chest x-ray 06/15/2019 FINDINGS: Screw Mains in place within the scapula. Glenohumeral joint is located with signs of degenerative change. No signs of acute fracture or dislocation. IMPRESSION: 1. Postoperative changes about the left shoulder, no signs of acute fracture. Electronically Signed   By: Zetta Bills M.D.   On: 09/11/2019 19:25    Medications:   . enoxaparin (LOVENOX) injection  40 mg Subcutaneous Daily  . feeding supplement (ENSURE ENLIVE)  237 mL Oral BID BM  . nicotine  7 mg Transdermal Daily  .  PARoxetine  20 mg Oral QPM   Continuous Infusions: . sodium chloride       LOS: 0 days   Darliss Cheney , MD [facility]   How to contact the [facility] Attending or Consulting provider [place] or covering provider during after hours [place], for this patient?  1. Check the care team in [facility] and look for a) attending/consulting TRH provider listed and b) the [facility] team listed 2. Log into [email or web address] and use [place]'s universal password to access. If you do not have the password, please contact the hospital operator. 3. Locate the [facility] provider you are looking for under [facility] and page to a number that you can be directly reached. 4. If you still have difficulty reaching the provider, please page the [facility] (Director on Call) for the Hospitalists listed on amion for assistance.  09/13/2019, 10:45 AM

## 2019-09-13 NOTE — TOC Initial Note (Signed)
Transition of Care [facility]) - Initial/Assessment Note    Patient Details  Name: Anthony Andrews MRN: [id number] Date of Birth: [birth year]  Transition of [facility]) CM/SW Contact:    Bary Castilla, LCSW Phone Number:[phone] 09/13/2019, 1:38 PM  Clinical Narrative:                  CSW met with patient's son Anthony Andrews) outside of patient's room to discuss the recommendation of a SNF due to patient's flucuating orientation.. Paitent's son was aware of recommendation and was in agreement. Anthony Andrews stated that his father is aware of the recommendation as well.Anthony Andrews prefers that patient go to [facility] due to his wife being at that facility. CSW and Anthony Andrews discussed other facilities that he was in agreement to faxing referral out to as well. CSW provided son with medicare.gov rating list. CSW informed Anthony Andrews that [facility] team will follow up with him once bed offers are received.  TOC team will continue to follow up with discharge planning needs.  Expected Discharge Plan: Skilled Nursing Facility Barriers to Discharge: Ship broker, Continued Medical Work up, SNF Pending bed offer   Patient Goals and CMS Choice   CMS Medicare.gov Compare Post Acute Care list provided to:: Patient Represenative (must comment)(son Anthony Andrews) Choice offered to / list presented to : Adult Children  Expected Discharge Plan and Services Expected Discharge Plan: [place]                                              Prior Living Arrangements/Services   Lives with:: Self, Spouse(Spouse is at [facility]) Patient language and need for interpreter reviewed:: Yes Do you feel safe going back to the place where you live?: Yes               Activities of Daily Living Home Assistive Devices/Equipment: Gilford Rile (specify type) ADL Screening (condition at time of admission) Patient's cognitive ability adequate to safely complete daily activities?: Yes Is the patient deaf or have  difficulty hearing?: No Does the patient have difficulty seeing, even when wearing glasses/contacts?: No Does the patient have difficulty concentrating, remembering, or making decisions?: Yes Patient able to express need for assistance with ADLs?: Yes Does the patient have difficulty dressing or bathing?: Yes Independently performs ADLs?: Yes (appropriate for developmental age) Does the patient have difficulty walking or climbing stairs?: Yes Weakness of Legs: Both Weakness of Arms/Hands: None  Permission Sought/Granted   Permission granted to share information with : Yes, Verbal Permission Granted  Share Information with NAME: Anthony Andrews  Permission granted to share info w AGENCY: SNFs  Permission granted to share info w Relationship: Son  Permission granted to share info w Contact Information: [phone]  Emotional Assessment   Attitude/Demeanor/Rapport: Unable to Assess Affect (typically observed): Unable to Assess Orientation: : Oriented to Self, Oriented to Place      Admission diagnosis:  Rhabdomyolysis [M62.82] Fall, initial encounter [W19.XXXA] Traumatic rhabdomyolysis, initial encounter ([place]) [T79.6XXA] Altered mental status, unspecified altered mental status type [R41.82] Patient Active Problem List   Diagnosis Date Noted  . AKI (acute kidney injury) ([place]) 09/12/2019  . Acute metabolic encephalopathy 73/41/9379  . Chronic hyponatremia 09/12/2019  . Metabolic acidosis 02/40/9735  . Rhabdomyolysis 09/11/2019   PCP:  Patient, No Pcp Per Pharmacy:   [facility], [place]  [address] [place] [address] Phone: [phone] Fax: [phone]     Social Determinants of Health (SDOH) Interventions    Readmission Risk Interventions No flowsheet data found.

## 2019-09-13 NOTE — Plan of Care (Signed)
  Problem: Education: Goal: Knowledge of General Education information will improve Description: Including pain rating scale, medication(s)/side effects and non-pharmacologic comfort measures Outcome: Progressing   Problem: Health Behavior/Discharge Planning: Goal: Ability to manage health-related needs will improve Outcome: Progressing   Problem: Clinical Measurements: Goal: Respiratory complications will improve Outcome: Progressing Goal: Cardiovascular complication will be avoided Outcome: Progressing   Problem: Activity: Goal: Risk for activity intolerance will decrease Outcome: Progressing   Problem: Elimination: Goal: Will not experience complications related to bowel motility Outcome: Progressing   Problem: Pain Managment: Goal: General experience of comfort will improve Outcome: Progressing   Problem: Safety: Goal: Ability to remain free from injury will improve Outcome: Progressing   Problem: Skin Integrity: Goal: Risk for impaired skin integrity will decrease Outcome: Progressing

## 2019-09-14 ENCOUNTER — Encounter (HOSPITAL_COMMUNITY): Payer: Self-pay | Admitting: Internal Medicine

## 2019-09-14 LAB — COMPREHENSIVE METABOLIC PANEL
ALT: 34 U/L (ref 0–44)
AST: 47 U/L — ABNORMAL HIGH (ref 15–41)
Albumin: 1.9 g/dL — ABNORMAL LOW (ref 3.5–5.0)
Alkaline Phosphatase: 47 U/L (ref 38–126)
Anion gap: 8 (ref 5–15)
BUN: 22 mg/dL (ref 8–23)
CO2: 18 mmol/L — ABNORMAL LOW (ref 22–32)
Calcium: 9 mg/dL (ref 8.9–10.3)
Chloride: 104 mmol/L (ref 98–111)
Creatinine, Ser: 1.2 mg/dL (ref 0.61–1.24)
GFR calc Af Amer: >60 mL/min (ref >60)
GFR calc non Af Amer: >60 mL/min (ref >60)
Glucose, Bld: 97 mg/dL (ref 70–99)
Potassium: 3.9 mmol/L (ref 3.5–5.1)
Sodium: 130 mmol/L — ABNORMAL LOW (ref 135–145)
Total Bilirubin: 0.6 mg/dL (ref 0.3–1.2)
Total Protein: 4.9 g/dL — ABNORMAL LOW (ref 6.5–8.1)

## 2019-09-14 LAB — CBC WITH DIFFERENTIAL/PLATELET
Abs Immature Granulocytes: 0.07 10*3/uL (ref 0.00–0.07)
Basophils Absolute: 0.1 10*3/uL (ref 0.0–0.1)
Basophils Relative: 1 %
Eosinophils Absolute: 0.1 10*3/uL (ref 0.0–0.5)
Eosinophils Relative: 1 %
HCT: 27.2 % — ABNORMAL LOW (ref 39.0–52.0)
Hemoglobin: 9.2 g/dL — ABNORMAL LOW (ref 13.0–17.0)
Immature Granulocytes: 1 %
Lymphocytes Relative: 12 %
Lymphs Abs: 1.2 10*3/uL (ref 0.7–4.0)
MCH: 32.3 pg (ref 26.0–34.0)
MCHC: 33.8 g/dL (ref 30.0–36.0)
MCV: 95.4 fL (ref 80.0–100.0)
Monocytes Absolute: 0.9 10*3/uL (ref 0.1–1.0)
Monocytes Relative: 10 %
Neutro Abs: 7.1 10*3/uL (ref 1.7–7.7)
Neutrophils Relative %: 75 %
Platelets: 435 10*3/uL — ABNORMAL HIGH (ref 150–400)
RBC: 2.85 MIL/uL — ABNORMAL LOW (ref 4.22–5.81)
RDW: 12.5 % (ref 11.5–15.5)
WBC: 9.5 10*3/uL (ref 4.0–10.5)
nRBC: 0 % (ref 0.0–0.2)

## 2019-09-14 LAB — CK: Total CK: 213 U/L (ref 49–397)

## 2019-09-14 LAB — SARS CORONAVIRUS 2 (TAT 6-24 HRS): SARS Coronavirus 2: NEGATIVE

## 2019-09-14 MED ORDER — INFLUENZA VAC A&B SA ADJ QUAD 0.5 ML IM PRSY
0.5000 mL | PREFILLED_SYRINGE | INTRAMUSCULAR | Status: AC
  Administered 2019-09-16: 10:00:00 0.5 mL via INTRAMUSCULAR
  Filled 2019-09-14: qty 0.5

## 2019-09-14 MED ORDER — ADULT MULTIVITAMIN W/MINERALS CH
1.0000 | ORAL_TABLET | Freq: Every day | ORAL | Status: DC
  Administered 2019-09-14 – 2019-09-21 (×8): 1 via ORAL
  Filled 2019-09-14 (×8): qty 1

## 2019-09-14 NOTE — Plan of Care (Signed)

## 2019-09-14 NOTE — Care Management (Signed)
Patient will require updated PT/OT notes for insurance authorization. CM team will continue to follow.  Midge Minium MSN, RN, NCM-BC, ACM-RN [phone]

## 2019-09-14 NOTE — Progress Notes (Signed)
Initial Nutrition Assessment  RD working remotely.  DOCUMENTATION CODES:   Not applicable  INTERVENTION:   -Discontinue Ensure Enlive po BID, each supplement provides 350 kcal and 20 grams of protein -MVI with minerals daily  NUTRITION DIAGNOSIS:   Increased nutrient needs related to acute illness as evidenced by estimated needs.  GOAL:   Patient will meet greater than or equal to 90% of their needs  MONITOR:   PO intake, Supplement acceptance, Labs, Weight trends, Skin, I & O's  REASON FOR ASSESSMENT:   Malnutrition Screening Tool    ASSESSMENT:   Anthony Andrews is a 73 y.o. 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  Patient states he tripped over a door frame and fell on the ground.  He hit his head but did not lose consciousness.  He was down on the ground for approximately 18 hours.  He was found by his son who called EMS.  Denies any neck pain, back pain, or pain anywhere else.  He did not have any dizziness/lightheadedness, chest pain, shortness of breath, or heart palpitations prior to his fall.  Denies recent illness, fevers, or chills.  Denies nausea, vomiting, diarrhea, or abdominal pain.  States he has a chronic cough as he is a smoker.  He is not any blood thinners.  Pt admitted with rhabdomyolysis.  Reviewed I/O's: -1.2 L x 24 hours and -755 ml since admission  UOP: 2. L x 24 hours  Attempted to speak with pt via phone, however, no answer. RD unable to obtain further nutrition-related history at this time. Per chart review, pt's mentation has improved since yesterday.   Pt with good appetite since admission and is consuming 100% of meals. Per MAR, he is also consuming Ensure supplements as ordered.   No weight history available in char,t so unsure if pt has lost weight.   Per CSW notes, plan for SNF placement at discharge  (possible tomorrow). Pt prefers placement at [facility], as his wife resides there.   Labs reviewed: Na: 130.   Diet Order:   Diet Order            Diet Heart Room service appropriate? Yes; Fluid consistency: Thin  Diet effective now              EDUCATION NEEDS:   No education needs have been identified at this time  Skin:  Skin Assessment: Reviewed RN Assessment  Last BM:  09/10/19  Height:   Ht Readings from Last 1 Encounters:  09/11/19 5\' 3"  (1.6 m)    Weight:   Wt Readings from Last 1 Encounters:  09/11/19 54.4 kg    Ideal Body Weight:  56.4 kg  BMI:  Body mass index is 21.26 kg/m.  Estimated Nutritional Needs:   Kcal:  1600-1800  Protein:  70-85 grams  Fluid:  > 1.6 L    Danna Sewell A. Jimmye Norman, RD, LDN, [facility] Registered Dietitian II Certified Diabetes Care and Education Specialist Pager: [phone] After hours Pager: [phone]

## 2019-09-14 NOTE — Progress Notes (Signed)
[facility] PROGRESS NOTE  Anthony Andrews [id number] DOB: April 29, 1946 DOA: 09/11/2019 PCP: Patient, No Pcp Per  Assessment/Plan:  Rhabdomyolysis. Patient had a mechanical fall at home and was down on the ground for 18 hours. CK elevated at 4536 and down to 213 this am.  IV fluids stopped.  -recheck CK in am  AKI Likely secondary to rhabdomyolysis and dehydration. Creatinine trending down this am. Creatinine was 0.9 previously. Fair urine output - Recheck labs in the morning. -encourage po intake. .  Mechanical fall CT head, CT C-spine, pelvic x-ray, and left shoulder x-ray negative for acute finding. Evaluated by PT.  They recommend SNF.  He lives with his wife. He is agreeable with going to rehab. FL2 sent per chart -OT consult -covid 19 test updaed -await bed  Acute metabolic encephalopathy. Appears stable at baseline. Ammonia, TSH, B12 levels are within normal limits.  No indications of UTI. Mild chronic hyponatremia otherwise no metabolic derangements. CT head without acute intracranial findings  Chronic hyponatremia. At his baseline at 130.  No symptoms.  Mild transaminitis Suspect related to rhabdomyolysis.trending down. Total bili within limits of normal -IV fluid hydration for rhabdomyolysis. -recheck in am.  Tobacco use Smokes 3 cigarettes/day. -Counseling -NicoDerm patch  Code Status: full Family Communication: patient Disposition Plan: snf   Consultants:    Procedures:    Antibiotics:    HPI/Subjective: Lying in bed watching tv. Denies pain/discomfort  Objective: Vitals:   09/14/19 0429 09/14/19 0836  BP: 127/78 125/82  Pulse: 99 98  Resp: 13 18  Temp: 98 F (36.7 C) 98.1 F (36.7 C)  SpO2: 90% (!) 87%    Intake/Output Summary (Last 24 hours) at 09/14/2019 1340 Last data filed at 09/14/2019 0900 Gross per 24 hour  Intake 764.58 ml  Output 1700 ml  Net -935.42 ml   Filed Weights   09/11/19 1818  Weight: 54.4 kg     Exam:   General:  Awake alert oriented to self and place  Cardiovascular: rrr no mgr no LE edema  Respiratory: normal effort BS clear but distant no wheeze  Abdomen: non-distended non-tender  Musculoskeletal: joints without swelling/erythema   Data Reviewed: Basic Metabolic Panel: Recent Labs  Lab 09/11/19 1838 09/12/19 0652 09/13/19 0018 09/14/19 0537  NA 129* 131* 130* 130*  K 4.5 3.6 3.7 3.9  CL 96* 102 103 104  CO2 19* 21* 17* 18*  GLUCOSE 105* 95 87 97  BUN 24* 23 24* 22  CREATININE 1.45* 1.47* 1.37* 1.20  CALCIUM 10.4* 9.0 9.5 9.0   Liver Function Tests: Recent Labs  Lab 09/11/19 1838 09/12/19 0652 09/13/19 0018 09/14/19 0537  AST 194* 125* 96* 47*  ALT 58* 47* 48* 34  ALKPHOS 72 54 58 47  BILITOT 0.5 0.3 0.4 0.6  PROT 7.0 5.1* 5.7* 4.9*  ALBUMIN 2.8* 2.1* 2.4* 1.9*   No results for input(s): LIPASE, AMYLASE in the last 168 hours. Recent Labs  Lab 09/12/19 0652  AMMONIA 10   CBC: Recent Labs  Lab 09/11/19 1838 09/12/19 0652 09/13/19 0018 09/14/19 0537  WBC 12.9* 8.9 9.1 9.5  NEUTROABS 10.9*  --   --  7.1  HGB 11.7* 9.0* 10.1* 9.2*  HCT 33.5* 27.0* 29.5* 27.2*  MCV 93.8 95.4 93.7 95.4  PLT 532* 465* 457* 435*   Cardiac Enzymes: Recent Labs  Lab 09/11/19 1838 09/12/19 0652 09/13/19 0018 09/14/19 0537  CKTOTAL 4,536* 2,034* 1,002* 213   BNP (last 3 results) No results for input(s): BNP in the last  8760 hours.  ProBNP (last 3 results) No results for input(s): PROBNP in the last 8760 hours.  CBG: No results for input(s): GLUCAP in the last 168 hours.  Recent Results (from the past 240 hour(s))  SARS CORONAVIRUS 2 (TAT 6-24 HRS) Nasopharyngeal Nasopharyngeal Swab     Status: None   Collection Time: 09/11/19 11:18 PM   Specimen: Nasopharyngeal Swab  Result Value Ref Range Status   SARS Coronavirus 2 NEGATIVE NEGATIVE Final    Comment: (NOTE) SARS-CoV-2 target nucleic acids are NOT DETECTED. The SARS-CoV-2 RNA is generally  detectable in upper and lower respiratory specimens during the acute phase of infection. Negative results do not preclude SARS-CoV-2 infection, do not rule out co-infections with other pathogens, and should not be used as the sole basis for treatment or other patient management decisions. Negative results must be combined with clinical observations, patient history, and epidemiological information. The expected result is Negative. Fact Sheet for Patients: SugarRoll.be Fact Sheet for Healthcare Providers: https://www.woods-mathews.com/ This test is not yet approved or cleared by the [place] FDA and  has been authorized for detection and/or diagnosis of SARS-CoV-2 by FDA under an Emergency Use Authorization (EUA). This EUA will remain  in effect (meaning this test can be used) for the duration of the COVID-19 declaration under Section 56 4(b)(1) of the Act, 21 U.S.C. section 360bbb-3(b)(1), unless the authorization is terminated or revoked sooner. Performed at [facility], [place] [address] [place], [place] [address]      Studies: No results found.  Scheduled Meds: . enoxaparin (LOVENOX) injection  40 mg Subcutaneous Daily  . feeding supplement (ENSURE ENLIVE)  237 mL Oral BID BM  . [START ON 09/15/2019] influenza vaccine adjuvanted  0.5 mL Intramuscular Tomorrow-1000  . nicotine  7 mg Transdermal Daily  . PARoxetine  20 mg Oral QPM   Continuous Infusions:  Principal Problem:   Rhabdomyolysis Active Problems:   AKI (acute kidney injury) ([place])   Acute metabolic encephalopathy   Metabolic acidosis   Chronic hyponatremia    Time spent: 62 minutes    [place] NP  [facility]  If 7PM-7AM, please contact night-coverage at [email or web address], password [facility] 09/14/2019, 1:40 PM  LOS: 1 day

## 2019-09-14 NOTE — Progress Notes (Signed)
Responded to Spiritual Consult for AD for Anthony Andrews.  Met Anthony Andrews this morning, delivered and explained AD to him.  He indicated he understood the form and how he was to fill it out. Left it with him on his tray with invitation to have his nurse call Chaplain when he is ready to have it notarized.

## 2019-09-14 NOTE — NC FL2 (Signed)
[place] LEVEL OF CARE SCREENING TOOL     IDENTIFICATION  Patient Name: Anthony Andrews Birthdate: [birth year] Sex: male Admission Date (Current Location): 09/11/2019  [facility] and [place] Number:  Herbalist and Address:  [facility], [place] [address], [place], [place] [address]      Provider Number: [id number]  Attending Physician Name and Address:  Darliss Cheney, MD  Relative Name and Phone Number:  Iona Beard [phone]    Current Level of Care: Hospital Recommended Level of Care: [place] Prior Approval Number:    Date Approved/Denied:   PASRR Number: PASRR under review  Discharge Plan: SNF    Current Diagnoses: Patient Active Problem List   Diagnosis Date Noted  . AKI (acute kidney injury) ([place]) 09/12/2019  . Acute metabolic encephalopathy [id number]  . Chronic hyponatremia 09/12/2019  . Metabolic acidosis [id number]  . Rhabdomyolysis 09/11/2019    Orientation RESPIRATION BLADDER Height & Weight     Self, Time  Normal Continent Weight: 54.4 kg Height:  5\' 3"  (160 cm)  BEHAVIORAL SYMPTOMS/MOOD NEUROLOGICAL BOWEL NUTRITION STATUS      Continent Diet(Cardiac)  AMBULATORY STATUS COMMUNICATION OF NEEDS Skin   Limited Assist Verbally Skin abrasions                       Personal Care Assistance Level of Assistance  Bathing, Feeding, Dressing Bathing Assistance: Limited assistance Feeding assistance: Independent Dressing Assistance: Limited assistance     Functional Limitations Info  Sight, Hearing, Speech Sight Info: Adequate Hearing Info: Adequate Speech Info: Adequate    SPECIAL CARE FACTORS FREQUENCY  PT (By licensed PT), OT (By licensed OT)     PT Frequency: 5X per week OT Frequency: 5x per week            Contractures Contractures Info: Not present    Additional Factors Info  Code Status, Allergies, Isolation Precautions Code Status Info: Full Allergies Info: NKA      Isolation Precautions Info: High fall risk     Current Medications (09/14/2019):  This is the current hospital active medication list Current Facility-Administered Medications  Medication Dose Route Frequency Provider Last Rate Last Admin  . 0.9 %  sodium chloride infusion   Intravenous Continuous Darliss Cheney, MD 125 mL/hr at 09/14/19 0524 New Bag at 09/14/19 0524  . acetaminophen (TYLENOL) tablet 650 mg  650 mg Oral Q6H PRN Shela Leff, MD       Or  . acetaminophen (TYLENOL) suppository 650 mg  650 mg Rectal Q6H PRN Shela Leff, MD      . albuterol (PROVENTIL) (2.5 MG/3ML) 0.083% nebulizer solution 2.5 mg  2.5 mg Inhalation Q4H PRN Darliss Cheney, MD      . enoxaparin (LOVENOX) injection 40 mg  40 mg Subcutaneous Daily Shela Leff, MD   40 mg at 09/14/19 0937  . feeding supplement (ENSURE ENLIVE) (ENSURE ENLIVE) liquid 237 mL  237 mL Oral BID BM Darliss Cheney, MD   237 mL at 09/14/19 0937  . [START ON 09/15/2019] influenza vaccine adjuvanted (FLUAD) injection 0.5 mL  0.5 mL Intramuscular Tomorrow-1000 Pahwani, Ravi, MD      . metoprolol tartrate (LOPRESSOR) injection 5 mg  5 mg Intravenous Q5 min PRN Blount, Scarlette Shorts T, NP   5 mg at 09/13/19 0017  . nicotine (NICODERM CQ - dosed in mg/24 hr) patch 7 mg  7 mg Transdermal Daily Shela Leff, MD   7 mg at 09/14/19  0937  . PARoxetine (PAXIL) tablet 20 mg  20 mg Oral QPM Vann, Jessica U, DO   20 mg at 09/13/19 1637     Discharge Medications: Please see discharge summary for a list of discharge medications.  Relevant Imaging Results:  Relevant Lab Results:   Additional Information ss# [id number]  [place] MSN, RN, NCM-BC, ACM-RN [phone]  Please be advised that the above-named patient will require a short-term nursing home stay-anticipated 30 days or less for rehabilitation and strengthening. The plan is for return home.

## 2019-09-14 NOTE — Progress Notes (Signed)
Telemetry called, HR elevated and in afib, pt also called out stating could not breathe, sats in the 70's on room air, PRN breathing treatment given & placed on Nonrebreather because sats were only coming up to the 80s on nasal cannula. PRN metoprolol also given for HR sustaining greater than 120. After PRN medication and oxygen pt states he feels better. Will continue to monitor. Let pt's RN , Louie Casa know what was going on.   Vital Signs MEWS/VS Documentation      09/14/2019 1608 09/14/2019 1613 09/14/2019 1616 09/14/2019 1739   MEWS Score:  3  1  1   --   MEWS Score Color:  Yellow  Green  Green  --   Resp:  (!) 24  --  (!) 22  --   Pulse:  (!) 120  --  94  --   BP:  (!) 154/101  --  134/86  --   O2 Device:  Programmer, applications  Room Air   O2 Flow Rate (L/min):  10 L/min  --  15 L/min  --   Level of Consciousness:  Alert  --  --  --           [place] 09/14/2019,6:14 PM

## 2019-09-14 NOTE — Progress Notes (Signed)
Dr. Baltazar Najjar notified about increased pulse. Pt asymptomatic. Will continue to monitor.

## 2019-09-15 ENCOUNTER — Inpatient Hospital Stay (HOSPITAL_COMMUNITY): Payer: Medicare PPO

## 2019-09-15 DIAGNOSIS — I4891 Unspecified atrial fibrillation: Secondary | ICD-10-CM | POA: Diagnosis not present

## 2019-09-15 DIAGNOSIS — J9601 Acute respiratory failure with hypoxia: Secondary | ICD-10-CM | POA: Diagnosis not present

## 2019-09-15 LAB — COMPREHENSIVE METABOLIC PANEL
ALT: 29 U/L (ref 0–44)
AST: 32 U/L (ref 15–41)
Albumin: 1.9 g/dL — ABNORMAL LOW (ref 3.5–5.0)
Alkaline Phosphatase: 52 U/L (ref 38–126)
Anion gap: 8 (ref 5–15)
BUN: 23 mg/dL (ref 8–23)
CO2: 19 mmol/L — ABNORMAL LOW (ref 22–32)
Calcium: 9.5 mg/dL (ref 8.9–10.3)
Chloride: 106 mmol/L (ref 98–111)
Creatinine, Ser: 1.29 mg/dL — ABNORMAL HIGH (ref 0.61–1.24)
GFR calc Af Amer: >60 mL/min (ref >60)
GFR calc non Af Amer: 55 mL/min — ABNORMAL LOW (ref >60)
Glucose, Bld: 91 mg/dL (ref 70–99)
Potassium: 4 mmol/L (ref 3.5–5.1)
Sodium: 133 mmol/L — ABNORMAL LOW (ref 135–145)
Total Bilirubin: 0.7 mg/dL (ref 0.3–1.2)
Total Protein: 4.9 g/dL — ABNORMAL LOW (ref 6.5–8.1)

## 2019-09-15 LAB — PROCALCITONIN: Procalcitonin: <0.1 ng/mL

## 2019-09-15 LAB — CK: Total CK: 106 U/L (ref 49–397)

## 2019-09-15 MED ORDER — ALBUTEROL SULFATE (2.5 MG/3ML) 0.083% IN NEBU
2.5000 mg | INHALATION_SOLUTION | RESPIRATORY_TRACT | Status: AC
  Administered 2019-09-15 (×3): 2.5 mg via RESPIRATORY_TRACT
  Filled 2019-09-15 (×3): qty 3

## 2019-09-15 MED ORDER — ORAL CARE MOUTH RINSE
15.0000 mL | Freq: Two times a day (BID) | OROMUCOSAL | Status: DC
  Administered 2019-09-15 – 2019-09-21 (×11): 15 mL via OROMUCOSAL

## 2019-09-15 MED ORDER — METOPROLOL TARTRATE 25 MG PO TABS
12.5000 mg | ORAL_TABLET | Freq: Two times a day (BID) | ORAL | Status: DC
  Administered 2019-09-15 – 2019-09-18 (×8): 12.5 mg via ORAL
  Filled 2019-09-15 (×8): qty 1

## 2019-09-15 NOTE — Progress Notes (Addendum)
[facility] PROGRESS NOTE  Anthony Andrews [id number] DOB: 1946-06-29 DOA: 09/11/2019 PCP: Patient, No Pcp Per  Assessment/Plan:  #1.  Rhabdomyolysis.  Resolved.  Patient experienced a mechanical fall at home and was down on the ground for 18 hours.  He received vigorous IV fluids and his CK went from 4536 to 106 this morning.  Urine output remained good.  Currently taking p.o. fluids and nourishment without problems.  Evaluated by physical therapy who recommended SNF.  Awaiting bed availability. Repeat covid 19 test negative 12/21.  #2.  Acute kidney injury.  Creatinine today 1.29 which is up from yesterday but down from admission at 1.47.  Chart review indicates creatinine was within the limits of normal in September of this year. -Monitor urine output -Hold nephrotoxins -Recheck in the morning   #3.  Acute respiratory failure with hypoxia/atrial fibrillation.  Yesterday evening telemetry revealed heart rate elevated and patient's rhythm was atrial fibrillation.  During this time patient complained of shortness of breath.  Oxygen saturation levels noted to be 70% on room air.  He was provided with nonrebreather and nebulizer as well as as needed metoprolol.  Heart rate this morning sinus rhythm in the 90s excision saturation greater than 90% on nonrebreather.  Patient denies shortness of breath but demonstrates mild increased work of breathing with conversation.  Breath sounds are coarse throughout faint end expiratory wheezing no crackles.  Intermittent wet sounding nonproductive cough.  History of tobacco use -Obtain a chest x-ray -Scheduled nebulizers -Incentive spirometry -Low-dose metoprolol twice daily with parameters  #4.  Acute metabolic encephalopathy.  Appears to be stable at baseline.  Ammonia TSH B12 levels are within the limits of normal.  Mild chronic hyponatremia otherwise no metabolic derangements.  CT of the head without acute findings  5.  Chronic hyponatremia.   Sodium level 133 this morning  #6.  Mild transaminitis.  Resolved this a.m.  Code Status: full Family Communication: patient Disposition Plan: facility when ready   Consultants:    Procedures:    Antibiotics:    HPI/Subjective: Sitting up in bed eating breakfast.  Denies pain or discomfort.  Denies shortness of breath  Objective: Vitals:   09/15/19 0556 09/15/19 0804  BP: 134/68   Pulse: 95   Resp: 14   Temp: 98.1 F (36.7 C)   SpO2: 98% 95%    Intake/Output Summary (Last 24 hours) at 09/15/2019 0854 Last data filed at 09/15/2019 0500 Gross per 24 hour  Intake 720 ml  Output 1125 ml  Net -405 ml   Filed Weights   09/11/19 1818  Weight: 54.4 kg    Exam:   General: Awake alert thin and frail-appearing  Cardiovascular: Regular rate and rhythm no murmur gallop or rub no lower extremity edema  Respiratory: Mild increased work of breathing with conversation.  Breath sounds with fair air movement.  Diffuse rhonchi mild/faint expiratory wheeze throughout.  Intermittent wet sounding nonproductive cough  Abdomen: Nondistended nontender soft positive bowel sounds throughout no guarding or rebounding  Musculoskeletal: joints without swelling/erythema.  Data Reviewed: Basic Metabolic Panel: Recent Labs  Lab 09/11/19 1838 09/12/19 0652 09/13/19 0018 09/14/19 0537 09/15/19 0411  NA 129* 131* 130* 130* 133*  K 4.5 3.6 3.7 3.9 4.0  CL 96* 102 103 104 106  CO2 19* 21* 17* 18* 19*  GLUCOSE 105* 95 87 97 91  BUN 24* 23 24* 22 23  CREATININE 1.45* 1.47* 1.37* 1.20 1.29*  CALCIUM 10.4* 9.0 9.5 9.0 9.5   Liver Function Tests:  Recent Labs  Lab 09/11/19 1838 09/12/19 0652 09/13/19 0018 09/14/19 0537 09/15/19 0411  AST 194* 125* 96* 47* 32  ALT 58* 47* 48* 34 29  ALKPHOS 72 54 58 47 52  BILITOT 0.5 0.3 0.4 0.6 0.7  PROT 7.0 5.1* 5.7* 4.9* 4.9*  ALBUMIN 2.8* 2.1* 2.4* 1.9* 1.9*   No results for input(s): LIPASE, AMYLASE in the last 168 hours. Recent  Labs  Lab 09/12/19 0652  AMMONIA 10   CBC: Recent Labs  Lab 09/11/19 1838 09/12/19 0652 09/13/19 0018 09/14/19 0537  WBC 12.9* 8.9 9.1 9.5  NEUTROABS 10.9*  --   --  7.1  HGB 11.7* 9.0* 10.1* 9.2*  HCT 33.5* 27.0* 29.5* 27.2*  MCV 93.8 95.4 93.7 95.4  PLT 532* 465* 457* 435*   Cardiac Enzymes: Recent Labs  Lab 09/11/19 1838 09/12/19 0652 09/13/19 0018 09/14/19 0537 09/15/19 0411  CKTOTAL 4,536* 2,034* 1,002* 213 106   BNP (last 3 results) No results for input(s): BNP in the last 8760 hours.  ProBNP (last 3 results) No results for input(s): PROBNP in the last 8760 hours.  CBG: No results for input(s): GLUCAP in the last 168 hours.  Recent Results (from the past 240 hour(s))  SARS CORONAVIRUS 2 (TAT 6-24 HRS) Nasopharyngeal Nasopharyngeal Swab     Status: None   Collection Time: 09/11/19 11:18 PM   Specimen: Nasopharyngeal Swab  Result Value Ref Range Status   SARS Coronavirus 2 NEGATIVE NEGATIVE Final    Comment: (NOTE) SARS-CoV-2 target nucleic acids are NOT DETECTED. The SARS-CoV-2 RNA is generally detectable in upper and lower respiratory specimens during the acute phase of infection. Negative results do not preclude SARS-CoV-2 infection, do not rule out co-infections with other pathogens, and should not be used as the sole basis for treatment or other patient management decisions. Negative results must be combined with clinical observations, patient history, and epidemiological information. The expected result is Negative. Fact Sheet for Patients: SugarRoll.be Fact Sheet for Healthcare Providers: https://www.woods-mathews.com/ This test is not yet approved or cleared by the [place] FDA and  has been authorized for detection and/or diagnosis of SARS-CoV-2 by FDA under an Emergency Use Authorization (EUA). This EUA will remain  in effect (meaning this test can be used) for the duration of the COVID-19  declaration under Section 56 4(b)(1) of the Act, 21 U.S.C. section 360bbb-3(b)(1), unless the authorization is terminated or revoked sooner. Performed at [facility], [place] [address], [place], [place] [address]   SARS CORONAVIRUS 2 (TAT 6-24 HRS) Nasopharyngeal Nasopharyngeal Swab     Status: None   Collection Time: 09/14/19 11:15 AM   Specimen: Nasopharyngeal Swab  Result Value Ref Range Status   SARS Coronavirus 2 NEGATIVE NEGATIVE Final    Comment: (NOTE) SARS-CoV-2 target nucleic acids are NOT DETECTED. The SARS-CoV-2 RNA is generally detectable in upper and lower respiratory specimens during the acute phase of infection. Negative results do not preclude SARS-CoV-2 infection, do not rule out co-infections with other pathogens, and should not be used as the sole basis for treatment or other patient management decisions. Negative results must be combined with clinical observations, patient history, and epidemiological information. The expected result is Negative. Fact Sheet for Patients: SugarRoll.be Fact Sheet for Healthcare Providers: https://www.woods-mathews.com/ This test is not yet approved or cleared by the [place] FDA and  has been authorized for detection and/or diagnosis of SARS-CoV-2 by FDA under an Emergency Use Authorization (EUA). This EUA will remain  in effect (meaning  this test can be used) for the duration of the COVID-19 declaration under Section 56 4(b)(1) of the Act, 21 U.S.C. section 360bbb-3(b)(1), unless the authorization is terminated or revoked sooner. Performed at [facility], [place] [address], [place], [place] [address]      Studies: DG CHEST PORT 1 VIEW  Result Date: 09/15/2019 CLINICAL DATA:  Short of breath, wheezing and coughing today. EXAM: PORTABLE CHEST 1 VIEW COMPARISON:  09/11/2019 FINDINGS: There is irregular interstitial thickening bilaterally, with relative sparing of the right  upper lobe. There is also hazy airspace opacity associated with the interstitial thickening. Interstitial thickening has increased when compared to prior exams. No focal lung consolidation. Possible small effusions. No pneumothorax. Cardiac silhouette is normal in size. No mediastinal or hilar masses. Skeletal structures are grossly intact. IMPRESSION: 1. Increase in irregular bilateral interstitial thickening suggests interstitial edema superimposed on chronic interstitial lung disease. Alternatively, findings may be due to superimposed multifocal infection. Electronically Signed   By: Lajean Manes M.D.   On: 09/15/2019 08:35    Scheduled Meds: . albuterol  2.5 mg Inhalation Q4H  . enoxaparin (LOVENOX) injection  40 mg Subcutaneous Daily  . influenza vaccine adjuvanted  0.5 mL Intramuscular Tomorrow-1000  . metoprolol tartrate  12.5 mg Oral BID  . multivitamin with minerals  1 tablet Oral Daily  . nicotine  7 mg Transdermal Daily  . PARoxetine  20 mg Oral QPM   Continuous Infusions:  Principal Problem:   Rhabdomyolysis Active Problems:   AKI (acute kidney injury) ([place])   Acute metabolic encephalopathy   Metabolic acidosis   Chronic hyponatremia    Time spent: 54 minutes    [place] NP  [facility] If 7PM-7AM, please contact night-coverage at [email or web address], password [facility] 09/15/2019, 8:54 AM  LOS: 2 days

## 2019-09-15 NOTE — Plan of Care (Signed)
  Problem: Education: Goal: Knowledge of General Education information will improve Description Including pain rating scale, medication(s)/side effects and non-pharmacologic comfort measures Outcome: Progressing   Problem: Activity: Goal: Risk for activity intolerance will decrease Outcome: Progressing   Problem: Safety: Goal: Ability to remain free from injury will improve Outcome: Progressing   

## 2019-09-15 NOTE — Evaluation (Signed)
Clinical/Bedside Swallow Evaluation Patient Details  Name: Anthony Andrews MRN: [id number] Date of Birth: [birth year]  Today's Date: 09/15/2019 Time: SLP Start Time (ACUTE ONLY): 1500 SLP Stop Time (ACUTE ONLY): 1520 SLP Time Calculation (min) (ACUTE ONLY): 20 min  Past Medical History:  Past Medical History:  Diagnosis Date  . Acute metabolic encephalopathy   . AKI (acute kidney injury) ([place])   . Chronic hyponatremia   . Rhabdomyolysis   . Stroke ([place])   . Tobacco use    Past Surgical History:  Past Surgical History:  Procedure Laterality Date  . BACK SURGERY    . SHOULDER SURGERY     HPI:  73yo male admitted 09/11/2019 due to a fall where he hit his head but did not lose consciousness. He was on the floor for approx 18 hours. PMH: R CVA with residual LLE weakness   Assessment / Plan / Recommendation Clinical Impression  Pt presents with generalized weakness, and significantly ill-fitting upper dentures. Pt exhibits a weak congested cough prior to, during, and after po trials. Pt reports having had an xray swallow test "about 25 years ago", and has had continuous difficulty swallowing since then. Given baseline cough regardless of po presentation in addition to his reported history of dysphagia, recommend instrumental assessment to objectively evaluate swallow function and safety. Will schedule MBS for tomorrow 09/16/2019.    SLP Visit Diagnosis: Dysphagia, unspecified (R13.10)    Aspiration Risk  Moderate aspiration risk    Diet Recommendation Regular;Thin liquid   Medication Administration: Whole meds with liquid Supervision: Patient able to self feed Compensations: Slow rate;Small sips/bites Postural Changes: Seated upright at 90 degrees;Remain upright for at least 30 minutes after po intake    Other  Recommendations Oral Care Recommendations: Oral care BID   Follow up Recommendations Other (comment)(TBD)      Frequency and Duration   pending MBS          Prognosis   fair-good     Swallow Study   General Date of Onset: 09/11/19 HPI: 73yo male admitted 09/11/2019 due to a fall where he hit his head but did not lose consciousness. He was on the floor for approx 18 hours. PMH: R CVA with residual LLE weakness Type of Study: Bedside Swallow Evaluation Previous Swallow Assessment: none Diet Prior to this Study: Regular;Thin liquids Temperature Spikes Noted: No Respiratory Status: Nasal cannula History of Recent Intubation: No Behavior/Cognition: Alert;Pleasant mood;Cooperative Oral Cavity Assessment: Within Functional Limits Oral Care Completed by SLP: No Oral Cavity - Dentition: Dentures, top;Dentures, bottom(upper dentures VERY ill fitting) Vision: Functional for self-feeding Self-Feeding Abilities: Able to feed self;Needs set up;Needs assist Patient Positioning: Upright in bed Baseline Vocal Quality: Normal Volitional Cough: Weak;Congested Volitional Swallow: Able to elicit    Oral/Motor/Sensory Function Overall Oral Motor/Sensory Function: Generalized oral weakness   Ice Chips Ice chips: Not tested   Thin Liquid Thin Liquid: Impaired Pharyngeal  Phase Impairments: Cough - Immediate    Nectar Thick Nectar Thick Liquid: Not tested   Honey Thick Honey Thick Liquid: Not tested   Puree Puree: Impaired Presentation: Spoon Pharyngeal Phase Impairments: Cough - Immediate   Solid     Solid: Impaired Presentation: Self Fed Pharyngeal Phase Impairments: Cough - Immediate     Enriqueta Shutter, [place], [place] Pathologist Office: [phone] Pager: [phone]  Shonna Chock 09/15/2019,3:34 PM

## 2019-09-15 NOTE — Progress Notes (Addendum)
Physical Therapy Treatment Patient Details Name: Anthony Andrews MRN: [id number] DOB: 06/17/1946 Today's Date: 09/15/2019    History of Present Illness Pt is a 73 y/o male admitted after falling at home. All imaging was negative for any acute findings; however, pt found to have Rhabdomyolysis after reportedly lying on the ground for 18 hours. PMH including but not limited to prior CVA with residual L sided weakness.    PT Comments    Pt admitted for above. Pt required min assist for bed mobility for trunk righting and min guard for safety with transfers. He demonstrated improvement in functional mobility by increasing his gait distance to 25 ft in the room with min assist for stability and cueing for posture and safety. Pt's gait pattern influenced by L sided weakness as he leads with R foot and has L Trendelenburg as well as flexed trunk. Pt on 6L of O2 throughout session. Pulse ox would not take on finger during mobility, but upon reaching chair, looked up SpO2 monitor attached to L foot, which showed 98%. Pt denied SOB or lightheadedness throughout session. Overall he tolerated treatment well. Pt will continue to benefit from skilled PT intervention in order to address deficits.   Follow Up Recommendations  SNF     Equipment Recommendations  None recommended by PT    Recommendations for Other Services       Precautions / Restrictions Precautions Precautions: Fall Restrictions Weight Bearing Restrictions: No    Mobility  Bed Mobility Overal bed mobility: Needs Assistance Bed Mobility: Supine to Sit     Supine to sit: Min assist     General bed mobility comments: increased time and effort, min A for trunk elevation  Transfers Overall transfer level: Needs assistance Equipment used: Rolling walker (2 wheeled) Transfers: Sit to/from Stand Sit to Stand: Min guard         General transfer comment: cueing for safe hand placement, min guard for  safety  Ambulation/Gait Ambulation/Gait assistance: Min assist Gait Distance (Feet): 25 Feet Assistive device: Rolling walker (2 wheeled) Gait Pattern/deviations: Step-to pattern;Decreased step length - right;Decreased step length - left;Decreased stride length;Shuffle;Trunk flexed;Trendelenburg Gait velocity: decreased   General Gait Details: min A for support due to mild instability and cues for walker placement and safety; pt leads with R foot due to left sided weakness and demonstrates L Trenedelenburg gait pattern   Stairs             Wheelchair Mobility    Modified Rankin (Stroke Patients Only)       Balance Overall balance assessment: Needs assistance Sitting-balance support: Feet supported;No upper extremity supported Sitting balance-Leahy Scale: Fair Sitting balance - Comments: EOB balance for donning of gait belt   Standing balance support: Bilateral upper extremity supported;Single extremity supported Standing balance-Leahy Scale: Poor Standing balance comment: requires B UE support for RW                            Cognition Arousal/Alertness: Awake/alert Behavior During Therapy: WFL for tasks assessed/performed Overall Cognitive Status: Impaired/Different from baseline Area of Impairment: Safety/judgement                         Safety/Judgement: Decreased awareness of safety;Decreased awareness of deficits            Exercises      General Comments        Pertinent Vitals/Pain Pain Assessment: No/denies pain  Home Living                      Prior Function            PT Goals (current goals can now be found in the care plan section) Acute Rehab PT Goals Patient Stated Goal: none stated PT Goal Formulation: With patient Time For Goal Achievement: 09/26/19 Potential to Achieve Goals: Good Progress towards PT goals: Progressing toward goals    Frequency    Min 2X/week      PT Plan Current  plan remains appropriate    Co-evaluation              AM-PAC PT "6 Clicks" Mobility   Outcome Measure  Help needed turning from your back to your side while in a flat bed without using bedrails?: A Little Help needed moving from lying on your back to sitting on the side of a flat bed without using bedrails?: A Little Help needed moving to and from a bed to a chair (including a wheelchair)?: A Little Help needed standing up from a chair using your arms (e.g., wheelchair or bedside chair)?: A Little Help needed to walk in hospital room?: A Little Help needed climbing 3-5 steps with a railing? : A Lot 6 Click Score: 17    End of Session Equipment Utilized During Treatment: Gait belt, oxygen (6L) Activity Tolerance: Patient tolerated treatment well Patient left: in chair;with call bell/phone within reach;with chair alarm set Nurse Communication: Mobility status PT Visit Diagnosis: Other abnormalities of gait and mobility (R26.89);Muscle weakness (generalized) (M62.81)     Time: [id number] PT Time Calculation (min) (ACUTE ONLY): 15 min  Charges:  $Gait Training: 8-22 mins                     Christel Mormon, SPT    [place] Linday Rhodes 09/15/2019, 11:46 AM

## 2019-09-15 NOTE — Plan of Care (Signed)

## 2019-09-15 NOTE — Progress Notes (Addendum)
   09/15/19 1521  SNF Authorization Status  SNF Authorization Type Transition of Care CM/SW Authorization Request  SNF Auth Started 09/15/19  SNF Auth Start Time 1521  SNF Authorization Status Started   Insurance authorization initiated with [facility] for [facility]; requested documents faxed as requested with auth pending. CM spoke to [place] [employer]); the facility can accept the patient on 12/23 pending insurance auth and COVID results.   Midge Minium MSN, RN, NCM-BC, ACM-RN [phone]

## 2019-09-15 NOTE — Progress Notes (Signed)
Came to room for breathing treatment, found pt w/ NRB pulled down to neck area, pt was attempting to eat breakfast.  PT denies SOB, no distress noted, sat 94%.  Placed pt on 6 lpm [place] for ease of eating breakfast, sat 95%.  Pt denies SOB.

## 2019-09-16 ENCOUNTER — Inpatient Hospital Stay (HOSPITAL_COMMUNITY): Payer: Medicare PPO

## 2019-09-16 ENCOUNTER — Encounter (HOSPITAL_COMMUNITY): Payer: Self-pay | Admitting: Internal Medicine

## 2019-09-16 DIAGNOSIS — I480 Paroxysmal atrial fibrillation: Secondary | ICD-10-CM

## 2019-09-16 DIAGNOSIS — M6282 Rhabdomyolysis: Secondary | ICD-10-CM

## 2019-09-16 DIAGNOSIS — J9601 Acute respiratory failure with hypoxia: Secondary | ICD-10-CM

## 2019-09-16 DIAGNOSIS — I10 Essential (primary) hypertension: Secondary | ICD-10-CM

## 2019-09-16 DIAGNOSIS — N179 Acute kidney failure, unspecified: Secondary | ICD-10-CM

## 2019-09-16 DIAGNOSIS — T796XXA Traumatic ischemia of muscle, initial encounter: Principal | ICD-10-CM

## 2019-09-16 DIAGNOSIS — E871 Hypo-osmolality and hyponatremia: Secondary | ICD-10-CM

## 2019-09-16 DIAGNOSIS — E782 Mixed hyperlipidemia: Secondary | ICD-10-CM

## 2019-09-16 DIAGNOSIS — Z8249 Family history of ischemic heart disease and other diseases of the circulatory system: Secondary | ICD-10-CM

## 2019-09-16 DIAGNOSIS — G9341 Metabolic encephalopathy: Secondary | ICD-10-CM

## 2019-09-16 LAB — CBC
HCT: 25.5 % — ABNORMAL LOW (ref 39.0–52.0)
Hemoglobin: 8.5 g/dL — ABNORMAL LOW (ref 13.0–17.0)
MCH: 31.7 pg (ref 26.0–34.0)
MCHC: 33.3 g/dL (ref 30.0–36.0)
MCV: 95.1 fL (ref 80.0–100.0)
Platelets: 447 10*3/uL — ABNORMAL HIGH (ref 150–400)
RBC: 2.68 MIL/uL — ABNORMAL LOW (ref 4.22–5.81)
RDW: 12.5 % (ref 11.5–15.5)
WBC: 8.8 10*3/uL (ref 4.0–10.5)
nRBC: 0 % (ref 0.0–0.2)

## 2019-09-16 LAB — BASIC METABOLIC PANEL
Anion gap: 7 (ref 5–15)
BUN: 27 mg/dL — ABNORMAL HIGH (ref 8–23)
CO2: 22 mmol/L (ref 22–32)
Calcium: 9.9 mg/dL (ref 8.9–10.3)
Chloride: 103 mmol/L (ref 98–111)
Creatinine, Ser: 1.27 mg/dL — ABNORMAL HIGH (ref 0.61–1.24)
GFR calc Af Amer: >60 mL/min (ref >60)
GFR calc non Af Amer: 56 mL/min — ABNORMAL LOW (ref >60)
Glucose, Bld: 97 mg/dL (ref 70–99)
Potassium: 4.5 mmol/L (ref 3.5–5.1)
Sodium: 132 mmol/L — ABNORMAL LOW (ref 135–145)

## 2019-09-16 LAB — SARS CORONAVIRUS 2 (TAT 6-24 HRS): SARS Coronavirus 2: NEGATIVE

## 2019-09-16 MED ORDER — ROSUVASTATIN CALCIUM 5 MG PO TABS
10.0000 mg | ORAL_TABLET | Freq: Every day | ORAL | Status: DC
  Administered 2019-09-16 – 2019-09-20 (×5): 10 mg via ORAL
  Filled 2019-09-16 (×5): qty 2

## 2019-09-16 MED ORDER — ASPIRIN EC 81 MG PO TBEC: 81.0000 mg | DELAYED_RELEASE_TABLET | Freq: Every morning | ORAL | Status: DC

## 2019-09-16 MED ORDER — APIXABAN 5 MG PO TABS
5.0000 mg | ORAL_TABLET | Freq: Two times a day (BID) | ORAL | Status: DC
  Administered 2019-09-16 – 2019-09-21 (×10): 5 mg via ORAL
  Filled 2019-09-16 (×10): qty 1

## 2019-09-16 MED ORDER — BIOTENE DRY MOUTH MT LIQD: 15.0000 mL | OROMUCOSAL | Status: DC | PRN

## 2019-09-16 MED ORDER — GUAIFENESIN ER 600 MG PO TB12
600.0000 mg | ORAL_TABLET | Freq: Two times a day (BID) | ORAL | Status: DC
  Administered 2019-09-16 – 2019-09-21 (×11): 600 mg via ORAL
  Filled 2019-09-16 (×11): qty 1

## 2019-09-16 MED ORDER — IOHEXOL 350 MG/ML SOLN
100.0000 mL | Freq: Once | INTRAVENOUS | Status: AC | PRN
  Administered 2019-09-16: 100 mL via INTRAVENOUS

## 2019-09-16 NOTE — Care Management Important Message (Signed)
Important Message  Patient Details  Name: Anthony Andrews MRN: [id number] Date of Birth: 11-Nov-1945   Medicare Important Message Given:  Yes     Orbie Pyo 09/16/2019, 2:46 PM

## 2019-09-16 NOTE — Progress Notes (Addendum)
   09/16/19 1233  SNF Authorization Status  SNF Authorization Complete 09/16/19  SNF Auth Complete Time 1233  SNF Authorization Status Complete   Insurance authorization received from [facility] for [facility]; Bernadene Bell [phone]  CM spoke to [facility] Admissions); the facility can accept the patient once he's medically stable for discharge. CM team will continue to follow.   Midge Minium MSN, RN, NCM-BC, ACM-RN [phone]

## 2019-09-16 NOTE — Progress Notes (Signed)
OT EVALUATION Late entry from 09/15/19   09/15/19 1500  OT Visit Information  Last OT Received On 09/15/19  Assistance Needed +1  History of Present Illness Pt is a 73 y/o male admitted after falling at home. All imaging was negative for any acute findings; however, pt found to have Rhabdomyolysis after reportedly lying on the ground for 18 hours. PMH including but not limited to prior CVA with residual L sided weakness.  Precautions  Precautions Fall  Restrictions  Weight Bearing Restrictions No  Home Living  Family/patient expects to be discharged to: Private residence  Living Arrangements Alone  Available Help at Discharge Family;Available PRN/intermittently  Type of Home House  Home Access Stairs to enter  Entrance Stairs-Number of Steps 5  Entrance Stairs-Rails Right  Home Layout One level  Bathroom Shower/Tub Tub/shower unit  Camera operator - 2 wheels  Prior Function  Level of Independence Independent with assistive device(s)  Comments ambulates with a RW; son drives  Communication  Communication No difficulties  Pain Assessment  Pain Assessment No/denies pain  Cognition  Arousal/Alertness Awake/alert  Behavior During Therapy WFL for tasks assessed/performed  Overall Cognitive Status Impaired/Different from baseline  Area of Impairment Safety/judgement  Safety/Judgement Decreased awareness of safety;Decreased awareness of deficits  Upper Extremity Assessment  Upper Extremity Assessment Generalized weakness  Lower Extremity Assessment  Lower Extremity Assessment Generalized weakness  Cervical / Trunk Assessment  Cervical / Trunk Assessment Kyphotic  ADL  Overall ADL's  Needs assistance/impaired  Eating/Feeding Modified independent  Grooming Modified independent;Sitting  Upper Body Bathing Minimal assistance  Lower Body Bathing Moderate assistance  Upper Body Dressing  Minimal assistance  Lower Body Dressing Moderate  assistance  Toilet Transfer Minimal assistance;RW  Functional mobility during ADLs Minimal assistance;Rolling walker  General ADL Comments pt transfered to chair for drinking his ensure. Family present and concern with decrease PO intake and difficulty after swallowing  Vision- History  Baseline Vision/History Wears glasses  Wears Glasses At all times  Bed Mobility  Overal bed mobility Needs Assistance  Bed Mobility Supine to Sit  Supine to sit Min assist  General bed mobility comments encouragement to use bed rail. pt needs (A) to elevate trunk from bed surface.   Transfers  Overall transfer level Needs assistance  Equipment used Rolling walker (2 wheeled)  Transfers Sit to/from Stand  Sit to Stand Min guard  General transfer comment pt needed (A) with pivioting due to posterior LOB.  Balance  Overall balance assessment Needs assistance  Sitting-balance support Bilateral upper extremity supported;Feet supported  Sitting balance-Leahy Scale Fair  Standing balance support Bilateral upper extremity supported;During functional activity  Standing balance-Leahy Scale Poor  Standing balance comment requires RW for support  OT - End of Session  Equipment Utilized During Treatment Gait belt;Rolling walker  Activity Tolerance Patient tolerated treatment well  Patient left in chair;with call bell/phone within reach;with chair alarm set;with family/visitor present  Nurse Communication Mobility status;Precautions  OT Assessment  OT Recommendation/Assessment Patient needs continued OT Services  OT Visit Diagnosis Unsteadiness on feet (R26.81)  OT Problem List Decreased strength;Decreased activity tolerance;Impaired balance (sitting and/or standing);Decreased safety awareness;Decreased knowledge of use of DME or AE;Decreased knowledge of precautions  OT Plan  OT Frequency (ACUTE ONLY) Min 2X/week  OT Treatment/Interventions (ACUTE ONLY) Self-care/ADL training;Therapeutic exercise;Neuromuscular  education;DME and/or AE instruction;Therapeutic activities;Cognitive remediation/compensation;Patient/family education;Balance training  AM-PAC OT "6 Clicks" Daily Activity Outcome Measure (Version 2)  Help from another person eating meals? 4  Help  from another person taking care of personal grooming? 4  Help from another person toileting, which includes using toliet, bedpan, or urinal? 3  Help from another person bathing (including washing, rinsing, drying)? 3  Help from another person to put on and taking off regular upper body clothing? 4  Help from another person to put on and taking off regular lower body clothing? 3  6 Click Score 21  OT Recommendation  Follow Up Recommendations SNF (requesting in [place])  OT Equipment 3 in 1 bedside commode  Individuals Consulted  Consulted and Agree with Results and Recommendations Patient;Family member/caregiver  Family Member Consulted son george present  Acute Rehab OT Goals  Patient Stated Goal none stated  OT Goal Formulation With patient  Time For Goal Achievement 09/30/19  Potential to Achieve Goals Good  OT Time Calculation  OT Start Time (ACUTE ONLY) 1342  OT Stop Time (ACUTE ONLY) 1359  OT Time Calculation (min) 17 min  OT General Charges  $OT Visit 1 Visit  OT Evaluation  $OT Eval Moderate Complexity 1 Mod  Written Expression  Dominant Hand Right    Brynn, OTR/L  Acute Rehabilitation Services Pager: [phone] Office: [phone] .

## 2019-09-16 NOTE — Progress Notes (Signed)
ANTICOAGULATION CONSULT NOTE - Initial Consult  Pharmacy Consult for apixaban Indication: atrial fibrillation  No Known Allergies  Patient Measurements: Height: 5\' 3"  (160 cm) Weight: 120 lb (54.4 kg) IBW/kg (Calculated) : 56.9   Vital Signs: Temp: 97.6 F (36.4 C) (12/23 1532) Temp Source: Oral (12/23 1532) BP: 126/72 (12/23 1532) Pulse Rate: 90 (12/23 1646)  Labs: Recent Labs    09/14/19 0537 09/15/19 0411 09/16/19 0417  HGB 9.2*  --  8.5*  HCT 27.2*  --  25.5*  PLT 435*  --  447*  CREATININE 1.20 1.29* 1.27*  CKTOTAL 213 106  --     Estimated Creatinine Clearance: 41 mL/min (A) (by C-G formula based on SCr of 1.27 mg/dL (H)).   Medical History: Past Medical History:  Diagnosis Date  . Acute metabolic encephalopathy   . AKI (acute kidney injury) ([place])   . Chronic hyponatremia   . Essential hypertension   . Rhabdomyolysis   . Stroke ([place])   . Tobacco use      Assessment: 18 yoM with hx remote CVA admitted s/p mechanical fall with rhabdo. Pt now with paroxysmal atrial fib - pharmacy asked to dose apixaban. No anticoagulants noted PTA, pt has been receiving enoxaparin for VTE ppx only this admit. H/H low but stable, Wt 54kg, Cr stable ~1.3.  Goal of Therapy:  Monitor platelets by anticoagulation protocol: Yes   Plan:  Apixaban 5mg  BID Pharmacy will sign off, reconsult as needed   Arrie Senate, PharmD, BCPS Clinical Pharmacist [phone] Please check AMION for all [facility] Pharmacy numbers 09/16/2019

## 2019-09-16 NOTE — Progress Notes (Signed)
Modified Barium Swallow Progress Note  Patient Details  Name: Masood Stclair MRN: [id number] Date of Birth: 03/27/1946  Today's Date: 09/16/2019  Modified Barium Swallow completed.  Full report located under Chart Review in the Imaging Section.  Brief recommendations include the following:  Clinical Impression  Pt presents with mild oropharyngeal dysphagia c/b reduced lingual strength and coordination, premature spillage, delayed swallow initiation, incomplete laryngeal closure, and reduced laryngeal elevation and excursion.  These deficits resulted in audible aspiration of thin liquid during the swallow.  Cough reflex was effecitve in clearing aspiration.  Single cup sips, prevented aspiration and  intermittently resulted in trace amount of transient penetration only.  There was aspiration with straw, and with serial cup sips. There was no penetration of serial cup sips of nectar thick liquid.  With pill simulation there was a gross amount of aspiration of thin liquid washed used with tablet.  Pt was unable to orally transit tablet with thin liquid or puree, and pill was expectorated on both trials.  Pills to be given with puree, whole if tolerated, and crushed if necessary. There was no penetration or aspiration noted with puree or solid textures.  Pt is agreeable to continuing current diet with use of strict aspiration precautions as listed below.  Recommend regular texture diet with thin liquid by SINGLE CUP SIPS only.  If pt is unable to execute compensatory strategies, or exhibits continued difficult with thin liquid, please downgrade to nectar thick liquids.   Swallow Evaluation Recommendations       SLP Diet Recommendations: Regular solids;Thin liquid   Liquid Administration via: Cup;No straw   Medication Administration: Whole meds with puree(crush if needed)       Compensations:   Slow rate;  Small sips/bites;  SINGLE SIPS ONLY   Postural Changes: Seated upright at 90  degrees   Oral Care Recommendations: Oral care BID        Celedonio Savage, [place], [place] Office: [phone] 09/16/2019,11:51 AM

## 2019-09-16 NOTE — Consult Note (Addendum)
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 Erlinda Hong.  History of Present Illness:   He was originally not from this area but has been here for about 6 months. He usually lives with his wife. However, she has been in a facility recently due to hip problems. He had a stroke in [place] about 2-3 years ago, unclear what workup he had at that time. He denies any history of any bleeding problems or prior GI ulcer issues. The only records available are from an ED visit in our system 05/2019 for episode of syncope. Workup revealed Hgb of 10.6 and hyponatremia of 129. CT head at that time had shown remote lacunar strokes, microvascular ischemic changes, atherosclerosis, and emphysema.   He presented to [facility] on 09/11/19 with mechanical fall. He tripped over a door frame and fell on the ground. He was down on the ground for approximately 18 hours. He was found by his son who called EMS. Upon arrival to the ED he was confused. Workup revealed elevated CK consistent with rhabdomyolysis, AKI secondary to rhabdo/dehydration, NAGMA, mild transaminitis, and continued anemia/hyponatremia. Last labs showed Na 132, Cr 1.27, albumin 1.9, Hgb 8.5, UA >300 protein.  He has improved to the point where he is nearing discharge and will be sent to SNF for rehab. He will be going to [facility] where his wife is at. We are asked to see him for possible atrial tachycardia vs atrial fibrillation.  EKG 09/13/19 did capture this. Review of telemetry shows NSR with intermittent paroxysms of atrial fibrillation (reviewed with Dr. Gwenlyn Found). He does not typically fall. This was the first time. He denies any acute complaints otherwise and states he feels good now after recovering. No CP, SOB or awareness of atrial fibrillation. He no longer drives because his wife says he was "getting crazy" when he drove.   Past Medical History:  Diagnosis Date   Acute metabolic encephalopathy    AKI (acute kidney injury) ([place])    Chronic hyponatremia    Essential hypertension    Rhabdomyolysis    Stroke ([place])    Tobacco use     Past Surgical History:  Procedure Laterality Date   BACK SURGERY     SHOULDER SURGERY       Inpatient Medications: Scheduled Meds:  [START ON 09/17/2019] aspirin EC  81 mg Oral q morning - 10a   enoxaparin (LOVENOX) injection  40 mg Subcutaneous Daily   guaiFENesin  600 mg Oral BID   mouth rinse  15 mL Mouth Rinse BID   metoprolol tartrate  12.5 mg Oral BID   multivitamin with minerals  1 tablet Oral Daily   nicotine  7 mg Transdermal Daily   PARoxetine  20 mg Oral QPM   rosuvastatin  10 mg Oral QHS   Continuous Infusions:   PRN Meds: acetaminophen **OR** acetaminophen, antiseptic oral rinse  Home Meds: Prior to Admission medications   Medication Sig Start Date End Date Taking? Authorizing Provider  albuterol (VENTOLIN  HFA) 108 (90 Base) MCG/ACT inhaler Inhale 2 puffs into the lungs every 4 (four) hours as needed for wheezing or shortness of breath.  05/27/19  Yes [provider]  amLODipine (NORVASC) 5 MG tablet Take 5 mg by mouth daily. 05/25/19  Yes [provider]  aspirin EC 81 MG tablet Take 81 mg by mouth every morning.   Yes [provider]  cholecalciferol (VITAMIN D3) 25 MCG (1000 UT) tablet Take 1,000 Units by mouth daily.   Yes [provider]  ibuprofen (ADVIL) 200 MG tablet Take 400 mg by mouth every 6 (six) hours as  needed for headache (pain).   Yes [provider]  losartan (COZAAR) 50 MG tablet Take 50 mg by mouth 2 (two) times daily. 07/27/19  Yes [provider]  PARoxetine (PAXIL) 20 MG tablet Take 20 mg by mouth daily.    Yes [provider]  rosuvastatin (CRESTOR) 10 MG tablet Take 10 mg by mouth at bedtime. 06/08/19  Yes [provider]    Allergies:   No Known Allergies  Social History:   Social History   Socioeconomic History   Marital status: Married    Spouse name: Not on file   Number of children: Not on file   Years of education: Not on file   Highest education level: Not on file  Occupational History   Not on file  Tobacco Use   Smoking status: Current Every Day Smoker    Types: Cigarettes   Smokeless tobacco: Former Systems developer   Tobacco comment: 3 cigs/per day  Substance and Sexual Activity   Alcohol use: Not Currently   Drug use: Never   Sexual activity: Not on file  Other Topics Concern   Not on file  Social History Narrative   Not on file   Social Determinants of Health   Financial Resource Strain:    Difficulty of Paying Living Expenses: Not on file  Food Insecurity:    Worried About Charity fundraiser in the Last Year: Not on file   [employer] of Food in the Last Year: Not on file  Transportation Needs:    Lack of Transportation (Medical): Not on file   Lack of Transportation (Non-Medical): Not on file  Physical Activity:    Days of Exercise per Week: Not on file   Minutes of Exercise per Session: Not on file  Stress:    Feeling of Stress : Not on file  Social Connections:    Frequency of Communication with Friends and Family: Not on file   Frequency of Social Gatherings with Friends and Family: Not on file   Attends Religious Services: Not on file   Active Member of Clubs or Organizations: Not on file   Attends Archivist Meetings: Not on file   Marital Status: Not on file  Intimate Partner Violence:    Fear of  Current or Ex-Partner: Not on file   Emotionally Abused: Not on file   Physically Abused: Not on file   Sexually Abused: Not on file     Family History:    Family History  Problem Relation Age of Onset   Heart attack Mother    Heart attack Father       ROS:  Please see the history of present illness.  + chronic cough. All other ROS reviewed and negative.     Physical Exam/Data:   Vitals:   09/16/19 0958 09/16/19 1000 09/16/19 1532 09/16/19 1646  BP: 117/87 117/87  126/72   Pulse: 95 95 85 90  Resp: 16 16 15    Temp: 97.9 F (36.6 C) 97.9 F (36.6 C) 97.6 F (36.4 C)   TempSrc: Oral Oral Oral   SpO2: 100% 100% 100% 95%  Weight:      Height:        Intake/Output Summary (Last 24 hours) at 09/16/2019 1751 Last data filed at 09/16/2019 1534 Gross per 24 hour  Intake --  Output 240 ml  Net -240 ml   Last 3 Weights 09/11/2019  Weight (lbs) 120 lb  Weight (kg) 54.432 kg     Body mass index is 21.26 kg/m.  General: Well developed thin WM in no acute distress. Head: Normocephalic, atraumatic, sclera non-icteric, no xanthomas, nares are without discharge.  Neck: Bilateral loud R>L carotid bruits. JVD not elevated. Lungs: Clear bilaterally to auscultation without wheezes, rales, or rhonchi. Breathing is unlabored. Heart: RRR with S1 S2. No murmurs, rubs, or gallops appreciated. Abdomen: Soft, non-tender, non-distended with normoactive bowel sounds. No hepatomegaly. No rebound/guarding. No obvious abdominal masses. Msk:  Strength and tone appear normal for age. Extremities: No clubbing or cyanosis. No edema.  Distal pedal pulses are 2+ and equal bilaterally. Neuro: Alert and oriented X 3. No facial asymmetry. No focal deficit. Moves all extremities spontaneously. Psych:  Responds to questions appropriately with a normal affect.   EKG:  The EKG was personally reviewed and demonstrates:   1) 09/12/19: borderline sinus tach 99bpm, prolonged QTC 554ms otherwise no acute  changes 2) 09/13/19: tachycardia - there was possible suggestion of P waves in some of the leads but per closer review with Dr. Gwenlyn Found, felt to reflect atrial fib RVR 125bpm with nonspecific STT changes otherwise 3) 09/15/19: sinus tach 108bpm - QTc 44ms, no acute changes except towards end of EKG has brief atrial run  Telemetry:  Telemetry was personally reviewed and demonstrates:  NSR with intermittent paroxysms of atrial fib  Relevant CV Studies: None available  Laboratory Data:  High Sensitivity Troponin:  No results for input(s): TROPONINIHS in the last 720 hours.   Cardiac EnzymesNo results for input(s): TROPONINI in the last 168 hours. No results for input(s): TROPIPOC in the last 168 hours.  Chemistry Recent Labs  Lab 09/14/19 0537 09/15/19 0411 09/16/19 0417  NA 130* 133* 132*  K 3.9 4.0 4.5  CL 104 106 103  CO2 18* 19* 22  GLUCOSE 97 91 97  BUN 22 23 27*  CREATININE 1.20 1.29* 1.27*  CALCIUM 9.0 9.5 9.9  GFRNONAA >60 55* 56*  GFRAA >60 >60 >60  ANIONGAP 8 8 7     Recent Labs  Lab 09/13/19 0018 09/14/19 0537 09/15/19 0411  PROT 5.7* 4.9* 4.9*  ALBUMIN 2.4* 1.9* 1.9*  AST 96* 47* 32  ALT 48* 34 29  ALKPHOS 58 47 52  BILITOT 0.4 0.6 0.7   Hematology Recent Labs  Lab 09/13/19 0018 09/14/19 0537 09/16/19 0417  WBC 9.1 9.5 8.8  RBC 3.15* 2.85* 2.68*  HGB 10.1* 9.2* 8.5*  HCT 29.5* 27.2* 25.5*  MCV 93.7 95.4 95.1  MCH 32.1 32.3 31.7  MCHC 34.2 33.8 33.3  RDW 12.5 12.5 12.5  PLT 457* 435* 447*   BNPNo results for input(s): BNP, PROBNP in the last 168 hours.  DDimer No results for input(s): DDIMER in the last 168 hours.   Radiology/Studies:  DG CHEST PORT 1 VIEW  Result Date: 09/15/2019 CLINICAL DATA:  Short of breath, wheezing and coughing today. EXAM: PORTABLE CHEST 1 VIEW  COMPARISON:  09/11/2019 FINDINGS: There is irregular interstitial thickening bilaterally, with relative sparing of the right upper lobe. There is also hazy airspace opacity  associated with the interstitial thickening. Interstitial thickening has increased when compared to prior exams. No focal lung consolidation. Possible small effusions. No pneumothorax. Cardiac silhouette is normal in size. No mediastinal or hilar masses. Skeletal structures are grossly intact. IMPRESSION: 1. Increase in irregular bilateral interstitial thickening suggests interstitial edema superimposed on chronic interstitial lung disease. Alternatively, findings may be due to superimposed multifocal infection. Electronically Signed   By: Lajean Manes M.D.   On: 09/15/2019 08:35   DG Swallowing Func-Speech Pathology  Result Date: 09/16/2019 Objective Swallowing Evaluation: Type of Study: MBS-Modified Barium Swallow Study  Patient Details Name: Anthony Andrews MRN: [id number] Date of Birth: 1946/08/14 Today's Date: 09/16/2019 Time: SLP Start Time (ACUTE ONLY): 1058 -SLP Stop Time (ACUTE ONLY): 1112 SLP Time Calculation (min) (ACUTE ONLY): 14 min Past Medical History: Past Medical History: Diagnosis Date  Acute metabolic encephalopathy   AKI (acute kidney injury) ([place])   Chronic hyponatremia   Rhabdomyolysis   Stroke ([place])   Tobacco use  Past Surgical History: Past Surgical History: Procedure Laterality Date  BACK SURGERY    SHOULDER SURGERY   HPI: 73yo male admitted 09/11/2019 due to a fall where he hit his head but did not lose consciousness. He was on the floor for approx 18 hours. PMH: R CVA with residual LLE weakness  Subjective: Pt awake, alert, pleasant, participative Assessment / Plan / Recommendation CHL IP CLINICAL IMPRESSIONS 09/16/2019 Clinical Impression Pt presents with mild oropharyngeal dysphagia c/b reduced lingual strength and coordination, premature spillage, delayed swallow initiation, incomplete laryngeal closure, and reduced laryngeal elevation and excursion.  These deficits resulted in audible aspiration of thin liquid during the swallow.  Cough reflex was effecitve in clearing aspiration.   Single cup sips, prevented aspiration and  intermittently resulted in trace amount of transient penetration only.  There was aspiration with straw, and with serial cup sips. There was no penetration of serial cup sips of nectar thick liquid.  With pill simulation there was a gross amount of aspiration of thin liquid washed used with tablet.  Pt was unable to orally transit tablet with thin liquid or puree, and pill was expectorated on both trials.  Pills to be given with puree, whole if tolerated, and crushed if necessary. There was no penetration or aspiration noted with puree or solid textures.  Pt is agreeable to continuing current diet with use of strict aspiration precautions as listed below. Recommend regular texture diet with thin liquid by SINGLE CUP SIPS only.  If pt is unable to execute compensatory strategies, or exhibits continued difficult with thin liquid, please downgrade to nectar thick liquids. SLP Visit Diagnosis Dysphagia, oropharyngeal phase (R13.12) Attention and concentration deficit following -- Frontal lobe and executive function deficit following -- Impact on safety and function Mild aspiration risk   CHL IP TREATMENT RECOMMENDATION 09/16/2019 Treatment Recommendations Therapy as outlined in treatment plan below   Prognosis 09/16/2019 Prognosis for Safe Diet Advancement Fair Barriers to Reach Goals Time post onset Barriers/Prognosis Comment -- CHL IP DIET RECOMMENDATION 09/16/2019 SLP Diet Recommendations Regular solids;Thin liquid Liquid Administration via Cup;No straw Medication Administration Whole meds with puree Compensations Slow rate;Small sips/bites Postural Changes Seated upright at 90 degrees   CHL IP OTHER RECOMMENDATIONS 09/16/2019 Recommended Consults -- Oral Care Recommendations Oral care BID Other Recommendations --   CHL IP FOLLOW UP RECOMMENDATIONS 09/16/2019 Follow up Recommendations (No  Data)   No flowsheet data found.     CHL IP ORAL PHASE 09/16/2019 Oral Phase Impaired  Oral - Pudding Teaspoon -- Oral - Pudding Cup -- Oral - Honey Teaspoon -- Oral - Honey Cup -- Oral - Nectar Teaspoon -- Oral - Nectar Cup Lingual pumping;Decreased bolus cohesion;Premature spillage Oral - Nectar Straw -- Oral - Thin Teaspoon -- Oral - Thin Cup Lingual pumping;Decreased bolus cohesion;Premature spillage Oral - Thin Straw Decreased bolus cohesion;Premature spillage Oral - Puree Premature spillage Oral - Mech Soft -- Oral - Regular Piecemeal swallowing;Premature spillage Oral - Multi-Consistency -- Oral - Pill Weak lingual manipulation;Lingual pumping;Reduced posterior propulsion;Holding of bolus;Lingual/palatal residue;Delayed oral transit;Decreased bolus cohesion;Premature spillage Oral Phase - Comment --  CHL IP PHARYNGEAL PHASE 09/16/2019 Pharyngeal Phase Impaired Pharyngeal- Pudding Teaspoon -- Pharyngeal -- Pharyngeal- Pudding Cup -- Pharyngeal -- Pharyngeal- Honey Teaspoon -- Pharyngeal -- Pharyngeal- Honey Cup -- Pharyngeal -- Pharyngeal- Nectar Teaspoon -- Pharyngeal -- Pharyngeal- Nectar Cup Delayed swallow initiation-vallecula;Delayed swallow initiation-pyriform sinuses Pharyngeal Material does not enter airway Pharyngeal- Nectar Straw -- Pharyngeal -- Pharyngeal- Thin Teaspoon -- Pharyngeal -- Pharyngeal- Thin Cup Delayed swallow initiation-pyriform sinuses;Delayed swallow initiation-vallecula;Reduced airway/laryngeal closure;Reduced laryngeal elevation;Penetration/Aspiration during swallow;Trace aspiration Pharyngeal Material does not enter airway;Material enters airway, remains ABOVE vocal cords then ejected out Pharyngeal- Thin Straw Delayed swallow initiation-vallecula;Reduced laryngeal elevation;Reduced airway/laryngeal closure;Penetration/Aspiration before swallow;Penetration/Aspiration during swallow;Moderate aspiration Pharyngeal Material enters airway, passes BELOW cords then ejected out Pharyngeal- Puree Delayed swallow initiation-vallecula Pharyngeal Material does not enter  airway Pharyngeal- Mechanical Soft -- Pharyngeal -- Pharyngeal- Regular Delayed swallow initiation-vallecula Pharyngeal Material does not enter airway Pharyngeal- Multi-consistency -- Pharyngeal -- Pharyngeal- Pill Delayed swallow initiation-pyriform sinuses;Reduced epiglottic inversion;Reduced anterior laryngeal mobility;Reduced laryngeal elevation;Reduced airway/laryngeal closure;Penetration/Aspiration before swallow;Penetration/Aspiration during swallow;Penetration/Apiration after swallow;Significant aspiration (Amount) Pharyngeal Material enters airway, passes BELOW cords and not ejected out despite cough attempt by patient;Material enters airway, passes BELOW cords then ejected out Pharyngeal Comment --  CHL IP CERVICAL ESOPHAGEAL PHASE 09/16/2019 Cervical Esophageal Phase WFL Pudding Teaspoon -- Pudding Cup -- Honey Teaspoon -- Honey Cup -- Nectar Teaspoon -- Nectar Cup -- Nectar Straw -- Thin Teaspoon -- Thin Cup -- Thin Straw -- Puree -- Mechanical Soft -- Regular -- Multi-consistency -- Pill -- Cervical Esophageal Comment -- Celedonio Savage, MA, CCC-SLP Acute Rehabilitation Services Office: [phone] 09/16/2019, 11:53 AM               Assessment and Plan:   1. Mechanical fall with rhabdomyolysis - improving with supportive management. Additional medical findings will need follow up by primary care including anemia, renal insufficiency, and hyponatremia. No bleeding reported. He had baseline anemia back in 05/2019 as well. Will place order for hemoccult. Earlier today he was requiring additional supplemental O2 so CTA was planned. However, most recent pulse ox has been normal. Will defer plan to primary team.  2. Tachycardia representative of paroxysmal atrial fibrillation - reviewed strips with MD. Suspect this contributed to his history of stroke. We will stop aspirin and start Eliquis per pharmacy. Place care management consult for Eliquis. RIsks/benefits discussed with patient who is agreeable.  Will need close monitoring of renal function and blood count. Continue metoprolol. Check echocardiogram while he is here. If recurrent episodes occur, can consider titrating metoprolol further.  3. AKI with proteinuria with hypoalbuminemia - further per primary team. Question whether proteinuria was from rhabdomyolysis. Would benefit from recheck at some point to exclude nephrotic-type process. Needs to avoid ibuprofen going forward (was on home med list).  4. H/o  CVA - on aspirin and statin prior to admission. See above regarding blood thinner therapy.  5. Prolonged QTc on admission - likely related to acute metabolic derangements on admission, Resolved on subsequent tracings.  6. Carotid bruits - fairly loud on exam. Will obtain carotid duplex.  For questions or updates, please contact [place] Please consult [email or web address] for contact info under     Signed, Charlie Pitter, PA-C  09/16/2019 5:51 PM  Agree with findings by Burna Mortimer  We are asked to see Mr. Eberle because of PAF seen on telemetry.  He is a 73 year old gentleman who recently relocated to [place].  His son lives in the area.  He has a history of remote stroke 3 years ago and while he was in [place] leaving him with left-sided motor deficit.  He does have a family history of heart disease as well as a history of tobacco abuse, treated hypertension hyperlipidemia.  He was admitted with a mechanical fall.  His wife is in the hospital so he was at home alone, tripped over a doorway and was found down for 18 hours until his son discovered him.  He was in rhabdomyolysis with moderate renal insufficiency that has since returned close to baseline.  On exam he has loud bilateral carotid bruits.  His heart reveals regular rate and rhythm.  Lungs are clear.  Is no peripheral edema.  On review of his EKGs and telemetry he does have episodes of PAF. The CHA2DSVASC2 score is  4 .  I believe we should start him on oral  anticoagulation (Eliquis 5 mg p.o. twice daily).  He will need a 2D echo, carotid Doppler studies and careful follow-up of his hemoglobin.  Lorretta Harp, M.D., [place], [facility], [place] [facility] [address]. [place], [place]  [address]  [phone] 09/16/2019 6:09 PM

## 2019-09-16 NOTE — Plan of Care (Signed)

## 2019-09-16 NOTE — Progress Notes (Signed)
Called vascular regarding echocardiogram and carotid doppler studies, left message to voicemail.

## 2019-09-16 NOTE — Progress Notes (Addendum)
Nutrition Follow-up  DOCUMENTATION CODES:   Not applicable  INTERVENTION:   -Continue MVI with minerals daily -Magic cup BID with meals, each supplement provides 290 kcal and 9 grams of protein  NUTRITION DIAGNOSIS:   Increased nutrient needs related to acute illness as evidenced by estimated needs.  Ongoing  GOAL:   Patient will meet greater than or equal to 90% of their needs  Progressing   MONITOR:   PO intake, Supplement acceptance, Labs, Weight trends, Skin, I & O's  REASON FOR ASSESSMENT:   Malnutrition Screening Tool    ASSESSMENT:   Anthony Andrews is a 73 y.o. 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  Patient states he tripped over a door frame and fell on the ground.  He hit his head but did not lose consciousness.  He was down on the ground for approximately 18 hours.  He was found by his son who called EMS.  Denies any neck pain, back pain, or pain anywhere else.  He did not have any dizziness/lightheadedness, chest pain, shortness of breath, or heart palpitations prior to his fall.  Denies recent illness, fevers, or chills.  Denies nausea, vomiting, diarrhea, or abdominal pain.  States he has a chronic cough as he is a smoker.  He is not any blood thinners.  12/23- s/p MBSS- recommend regular diet with thin liquids  Reviewed I/O's: +240 ml x 24 hours and -920 ml since admission  UOP: 240 ml x 24 hours  Pt remains with good appetite. Noted meal completion 50-100%. Per MD notes, pt has the tendency to choke, so SLP consulted to evaluate.   Per RNCM notes, plan to discharge to SNF once medically stable. Insurance authorization has been obtained.   Albumin has a half-life of 21 days and is strongly affected by stress response and inflammatory process, therefore, do not expect to see an improvement in this lab value during acute  hospitalization. When a patient presents with low albumin, it is likely skewed due to the acute inflammatory response.  Unless it is suspected that patient had poor PO intake or malnutrition prior to admission, then RD should not be consulted solely for low albumin. Note that low albumin is no longer used to diagnose malnutrition; [facility] uses the new malnutrition guidelines published by the American Society for Parenteral and Enteral Nutrition (A.S.P.E.N.) and the Academy of Nutrition and Dietetics (AND).    Labs reviewed: Na: 132.   Diet Order:   Diet Order            Diet Heart Room service appropriate? Yes; Fluid consistency: Thin  Diet effective now              EDUCATION NEEDS:   No education needs have been identified at this time  Skin:  Skin Assessment: Reviewed RN Assessment  Last BM:  09/15/19  Height:   Ht Readings from Last 1 Encounters:  09/11/19 5\' 3"  (1.6 m)    Weight:   Wt Readings from Last 1 Encounters:  09/11/19 54.4 kg    Ideal Body Weight:  56.4 kg  BMI:  Body mass index is 21.26 kg/m.  Estimated Nutritional Needs:   Kcal:  1600-1800  Protein:  70-85 grams  Fluid:  > 1.6 L    Chenita Ruda A. Jimmye Norman, RD, LDN, [place] Registered Dietitian II Certified Diabetes Care and Education Specialist Pager: [phone] After  hours Pager: [phone]

## 2019-09-16 NOTE — Progress Notes (Signed)
PROGRESS NOTE  Anthony Andrews [id number] DOB: 1945/10/04 DOA: 09/11/2019 PCP: Patient, No Pcp Per  Brief summary:  Anthony Andrews is a 73 y.o. 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 Patient states he tripped over a door frame and fell on the ground.  He hit his head but did not lose consciousness.  He was down on the ground for approximately 18 hours.  He was found by his son who called EMS.   He is found to have AKI, rhabdomyolysis, acute metabolic encephalopathy, and hypoxia New diagnosis of atrial tachycardia versus A. Fib, cardiology consult requested  will need to go to skilled nursing facility, repeat Covid screening ordered  HPI/Recap of past 24 hours:  Remains on oxygen supplement this morning, he is not a good historian, has poor memory, oriented x3 today   Assessment/Plan: Principal Problem:   Rhabdomyolysis Active Problems:   AKI (acute kidney injury) ([place])   Acute metabolic encephalopathy   Chronic hyponatremia   Metabolic acidosis   Acute respiratory failure with hypoxia ([place])   Atrial fibrillation ([place])   Unwitnessed fall at home -Patient states he tripped over a door frame and fell on the ground.  -Head, CT cervical spine no acute findings, UDS negative, alcohol level less than 10 -Does appear to have atrial tachycardia versus A. fib on telemetry, will consult cardiology  Atrial tachycardia versus A. Fib -Currently on Lopressor -New diagnosis on telemetry, will consult cardiology  Acute hypoxic respiratory failure -not on home o2 ,  - Chest x-ray shows increase in irregular bilateral interstitial thickening, interstitial edema or possible infection, doubt infection ,procalcitonin less than 0.1 , no leukocytosis, no fever -history of smoking emphysema -report choking episode, he did well with speech therapy and barium swallow study -currently on 5-6 liters with intermittent  tachycardia, will get cta chest  Rhabdomyolysis/abdominal FLT Lab work normalized  Acute metabolic encephalopathy -Per H&P "patient knows his name.  He is aware that he is at a hospital.  Does not know which city he is in.  Not oriented to time.  He thinks it is 2010.  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 -On aspirin and Crestor at home, held since admission -Resume aspirin and Crestor today  HTN:  Home meds Norvasc and losartan held since admission He is currently on Lopressor  DVT Prophylaxis: Lovenox  Code Status: Full  Family Communication: patient , son Iona Beard over the phone  Disposition Plan: snf discharge on hold, will get cta chest  Due to hypoxic, will get cardiology consult due to possible afib   Consultants:  Cardiology  Procedures:  None  Antibiotics:  None   Objective: BP 117/87 (BP Location: Right Arm)   Pulse 95   Temp 97.9 F (36.6 C) (Oral)   Resp 16   Ht 5\' 3"  (1.6 m)   Wt 54.4 kg   SpO2 100%   BMI 21.26 kg/m  No intake or output data in the 24 hours ending 09/16/19 1337 Filed Weights   09/11/19 1818  Weight: 54.4 kg    Exam: Patient is examined daily including today on 09/16/2019, exams remain the same as of yesterday except that has changed  General: Frail, chronic ill-appearing, thin, NAD  Cardiovascular: RRR  Respiratory: Diminished at bases, no rhonchi ,no wheezing  Abdomen: Soft/ND/NT, positive BS  Musculoskeletal: No Edema  Neuro: alert, oriented x3, impaired memory  Data Reviewed: Basic Metabolic Panel: Recent Labs  Lab 09/12/19 0652 09/13/19 0018 09/14/19 0537 09/15/19 0411 09/16/19 0417  NA 131* 130* 130* 133* 132*  K 3.6 3.7 3.9 4.0 4.5  CL  102 103 104 106 103  CO2 21* 17* 18* 19* 22  GLUCOSE 95 87 97 91 97  BUN 23 24* 22 23 27*  CREATININE 1.47* 1.37* 1.20 1.29* 1.27*  CALCIUM 9.0 9.5 9.0 9.5 9.9   Liver Function Tests: Recent Labs  Lab 09/11/19 1838 09/12/19 0652 09/13/19 0018 09/14/19 0537 09/15/19 0411  AST 194* 125* 96* 47* 32  ALT 58* 47* 48* 34 29  ALKPHOS 72 54 58 47 52  BILITOT 0.5 0.3 0.4 0.6 0.7  PROT 7.0 5.1* 5.7* 4.9* 4.9*  ALBUMIN 2.8* 2.1* 2.4* 1.9* 1.9*   No results for input(s): LIPASE, AMYLASE in the last 168 hours. Recent Labs  Lab 09/12/19 0652  AMMONIA 10   CBC: Recent Labs  Lab 09/11/19 1838 09/12/19 0652 09/13/19 0018 09/14/19 0537 09/16/19 0417  WBC 12.9* 8.9 9.1 9.5 8.8  NEUTROABS 10.9*  --   --  7.1  --   HGB 11.7* 9.0* 10.1* 9.2* 8.5*  HCT 33.5* 27.0* 29.5* 27.2* 25.5*  MCV 93.8 95.4 93.7 95.4 95.1  PLT 532* 465* 457* 435* 447*   Cardiac Enzymes:   Recent Labs  Lab 09/11/19 1838 09/12/19 0652 09/13/19 0018 09/14/19 0537 09/15/19 0411  CKTOTAL 4,536* 2,034* 1,002* 213 106   BNP (last 3 results) No results for input(s): BNP in the last 8760 hours.  ProBNP (last 3 results) No results for input(s): PROBNP in the last 8760 hours.  CBG: No results for input(s): GLUCAP in the last 168 hours.  Recent Results (from the past 240 hour(s))  SARS CORONAVIRUS 2 (TAT 6-24 HRS) Nasopharyngeal Nasopharyngeal Swab     Status: None   Collection Time: 09/11/19 11:18 PM   Specimen: Nasopharyngeal Swab  Result Value Ref Range Status   SARS Coronavirus 2 NEGATIVE NEGATIVE Final    Comment: (NOTE) SARS-CoV-2 target nucleic acids are NOT DETECTED. The SARS-CoV-2 RNA is generally detectable in upper and lower respiratory specimens during the acute phase of infection. Negative results do not preclude SARS-CoV-2 infection, do not rule out co-infections with other pathogens, and should not be used as the sole basis for treatment or other patient management decisions. Negative  results must be combined with clinical observations, patient history, and epidemiological information. The expected result is Negative. Fact Sheet for Patients: SugarRoll.be Fact Sheet for Healthcare Providers: https://www.woods-mathews.com/ This test is not yet approved or cleared by the [place] FDA and  has been authorized for detection and/or diagnosis of SARS-CoV-2 by FDA under an Emergency Use Authorization (EUA). This EUA will remain  in effect (meaning this test can be used) for the duration of the COVID-19 declaration under Section 56 4(b)(1) of the Act, 21 U.S.C. section 360bbb-3(b)(1), unless the authorization is terminated or revoked sooner. Performed at [facility], [place] [address], [place], [place] [address]   SARS CORONAVIRUS 2 (TAT 6-24 HRS) Nasopharyngeal Nasopharyngeal Swab     Status: None   Collection Time: 09/14/19 11:15 AM   Specimen: Nasopharyngeal Swab  Result Value Ref Range Status   SARS Coronavirus 2 NEGATIVE NEGATIVE Final  Comment: (NOTE) SARS-CoV-2 target nucleic acids are NOT DETECTED. The SARS-CoV-2 RNA is generally detectable in upper and lower respiratory specimens during the acute phase of infection. Negative results do not preclude SARS-CoV-2 infection, do not rule out co-infections with other pathogens, and should not be used as the sole basis for treatment or other patient management decisions. Negative results must be combined with clinical observations, patient history, and epidemiological information. The expected result is Negative. Fact Sheet for Patients: SugarRoll.be Fact Sheet for Healthcare Providers: https://www.woods-mathews.com/ This test is not yet approved or cleared by the [place] FDA and  has been authorized for detection and/or diagnosis of SARS-CoV-2 by FDA under an Emergency Use Authorization (EUA). This EUA will remain  in  effect (meaning this test can be used) for the duration of the COVID-19 declaration under Section 56 4(b)(1) of the Act, 21 U.S.C. section 360bbb-3(b)(1), unless the authorization is terminated or revoked sooner. Performed at [facility], [place] [address], [place], [place] [address]      Studies: DG Swallowing Func-Speech Pathology  Result Date: 09/16/2019 Objective Swallowing Evaluation: Type of Study: MBS-Modified Barium Swallow Study  Patient Details Name: Nahim Swiney MRN: [id number] Date of Birth: Sep 19, 1946 Today's Date: 09/16/2019 Time: SLP Start Time (ACUTE ONLY): 1058 -SLP Stop Time (ACUTE ONLY): 1112 SLP Time Calculation (min) (ACUTE ONLY): 14 min Past Medical History: Past Medical History: Diagnosis Date . Acute metabolic encephalopathy  . AKI (acute kidney injury) ([place])  . Chronic hyponatremia  . Rhabdomyolysis  . Stroke ([place])  . Tobacco use  Past Surgical History: Past Surgical History: Procedure Laterality Date . BACK SURGERY   . SHOULDER SURGERY   HPI: 73yo male admitted 09/11/2019 due to a fall where he hit his head but did not lose consciousness. He was on the floor for approx 18 hours. PMH: R CVA with residual LLE weakness  Subjective: Pt awake, alert, pleasant, participative Assessment / Plan / Recommendation CHL IP CLINICAL IMPRESSIONS 09/16/2019 Clinical Impression Pt presents with mild oropharyngeal dysphagia c/b reduced lingual strength and coordination, premature spillage, delayed swallow initiation, incomplete laryngeal closure, and reduced laryngeal elevation and excursion.  These deficits resulted in audible aspiration of thin liquid during the swallow.  Cough reflex was effecitve in clearing aspiration.  Single cup sips, prevented aspiration and  intermittently resulted in trace amount of transient penetration only.  There was aspiration with straw, and with serial cup sips. There was no penetration of serial cup sips of nectar thick liquid.  With pill simulation there was  a gross amount of aspiration of thin liquid washed used with tablet.  Pt was unable to orally transit tablet with thin liquid or puree, and pill was expectorated on both trials.  Pills to be given with puree, whole if tolerated, and crushed if necessary. There was no penetration or aspiration noted with puree or solid textures.  Pt is agreeable to continuing current diet with use of strict aspiration precautions as listed below. Recommend regular texture diet with thin liquid by SINGLE CUP SIPS only.  If pt is unable to execute compensatory strategies, or exhibits continued difficult with thin liquid, please downgrade to nectar thick liquids. SLP Visit Diagnosis Dysphagia, oropharyngeal phase (R13.12) Attention and concentration deficit following -- Frontal lobe and executive function deficit following -- Impact on safety and function Mild aspiration risk   CHL IP TREATMENT RECOMMENDATION 09/16/2019 Treatment Recommendations Therapy as outlined in treatment plan below   Prognosis 09/16/2019 Prognosis for Safe Diet Advancement Fair Barriers to  Reach Goals Time post onset Barriers/Prognosis Comment -- CHL IP DIET RECOMMENDATION 09/16/2019 SLP Diet Recommendations Regular solids;Thin liquid Liquid Administration via Cup;No straw Medication Administration Whole meds with puree Compensations Slow rate;Small sips/bites Postural Changes Seated upright at 90 degrees   CHL IP OTHER RECOMMENDATIONS 09/16/2019 Recommended Consults -- Oral Care Recommendations Oral care BID Other Recommendations --   CHL IP FOLLOW UP RECOMMENDATIONS 09/16/2019 Follow up Recommendations (No Data)   No flowsheet data found.     CHL IP ORAL PHASE 09/16/2019 Oral Phase Impaired Oral - Pudding Teaspoon -- Oral - Pudding Cup -- Oral - Honey Teaspoon -- Oral - Honey Cup -- Oral - Nectar Teaspoon -- Oral - Nectar Cup Lingual pumping;Decreased bolus cohesion;Premature spillage Oral - Nectar Straw -- Oral - Thin Teaspoon -- Oral - Thin Cup Lingual  pumping;Decreased bolus cohesion;Premature spillage Oral - Thin Straw Decreased bolus cohesion;Premature spillage Oral - Puree Premature spillage Oral - Mech Soft -- Oral - Regular Piecemeal swallowing;Premature spillage Oral - Multi-Consistency -- Oral - Pill Weak lingual manipulation;Lingual pumping;Reduced posterior propulsion;Holding of bolus;Lingual/palatal residue;Delayed oral transit;Decreased bolus cohesion;Premature spillage Oral Phase - Comment --  CHL IP PHARYNGEAL PHASE 09/16/2019 Pharyngeal Phase Impaired Pharyngeal- Pudding Teaspoon -- Pharyngeal -- Pharyngeal- Pudding Cup -- Pharyngeal -- Pharyngeal- Honey Teaspoon -- Pharyngeal -- Pharyngeal- Honey Cup -- Pharyngeal -- Pharyngeal- Nectar Teaspoon -- Pharyngeal -- Pharyngeal- Nectar Cup Delayed swallow initiation-vallecula;Delayed swallow initiation-pyriform sinuses Pharyngeal Material does not enter airway Pharyngeal- Nectar Straw -- Pharyngeal -- Pharyngeal- Thin Teaspoon -- Pharyngeal -- Pharyngeal- Thin Cup Delayed swallow initiation-pyriform sinuses;Delayed swallow initiation-vallecula;Reduced airway/laryngeal closure;Reduced laryngeal elevation;Penetration/Aspiration during swallow;Trace aspiration Pharyngeal Material does not enter airway;Material enters airway, remains ABOVE vocal cords then ejected out Pharyngeal- Thin Straw Delayed swallow initiation-vallecula;Reduced laryngeal elevation;Reduced airway/laryngeal closure;Penetration/Aspiration before swallow;Penetration/Aspiration during swallow;Moderate aspiration Pharyngeal Material enters airway, passes BELOW cords then ejected out Pharyngeal- Puree Delayed swallow initiation-vallecula Pharyngeal Material does not enter airway Pharyngeal- Mechanical Soft -- Pharyngeal -- Pharyngeal- Regular Delayed swallow initiation-vallecula Pharyngeal Material does not enter airway Pharyngeal- Multi-consistency -- Pharyngeal -- Pharyngeal- Pill Delayed swallow initiation-pyriform sinuses;Reduced  epiglottic inversion;Reduced anterior laryngeal mobility;Reduced laryngeal elevation;Reduced airway/laryngeal closure;Penetration/Aspiration before swallow;Penetration/Aspiration during swallow;Penetration/Apiration after swallow;Significant aspiration (Amount) Pharyngeal Material enters airway, passes BELOW cords and not ejected out despite cough attempt by patient;Material enters airway, passes BELOW cords then ejected out Pharyngeal Comment --  CHL IP CERVICAL ESOPHAGEAL PHASE 09/16/2019 Cervical Esophageal Phase WFL Pudding Teaspoon -- Pudding Cup -- Honey Teaspoon -- Honey Cup -- Nectar Teaspoon -- Nectar Cup -- Nectar Straw -- Thin Teaspoon -- Thin Cup -- Thin Straw -- Puree -- Mechanical Soft -- Regular -- Multi-consistency -- Pill -- Cervical Esophageal Comment -- Celedonio Savage, MA, CCC-SLP Acute Rehabilitation Services Office: [phone] 09/16/2019, 11:53 AM               Scheduled Meds: . enoxaparin (LOVENOX) injection  40 mg Subcutaneous Daily  . guaiFENesin  600 mg Oral BID  . mouth rinse  15 mL Mouth Rinse BID  . metoprolol tartrate  12.5 mg Oral BID  . multivitamin with minerals  1 tablet Oral Daily  . nicotine  7 mg Transdermal Daily  . PARoxetine  20 mg Oral QPM    Continuous Infusions:   Time spent: 48mins I have personally reviewed and interpreted on  09/16/2019 daily labs, tele strips, imagings as discussed above under date review session and assessment and plans.  I reviewed all nursing notes, pharmacy notes, consultant notes,  vitals, pertinent old records  I  have discussed plan of care as described above with RN , patient and family on 09/16/2019   Florencia Reasons MD, PhD, FACP  [facility] Pager [phone]. If 7PM-7AM, please contact night-coverage at [email or web address], password [facility] 09/16/2019, 1:37 PM  LOS: 3 days

## 2019-09-17 ENCOUNTER — Inpatient Hospital Stay (HOSPITAL_COMMUNITY): Payer: Medicare PPO

## 2019-09-17 DIAGNOSIS — R011 Cardiac murmur, unspecified: Secondary | ICD-10-CM

## 2019-09-17 DIAGNOSIS — I4891 Unspecified atrial fibrillation: Secondary | ICD-10-CM

## 2019-09-17 LAB — CBC WITH DIFFERENTIAL/PLATELET
Abs Immature Granulocytes: 0.04 10*3/uL (ref 0.00–0.07)
Basophils Absolute: 0.1 10*3/uL (ref 0.0–0.1)
Basophils Relative: 1 %
Eosinophils Absolute: 0.3 10*3/uL (ref 0.0–0.5)
Eosinophils Relative: 4 %
HCT: 24.9 % — ABNORMAL LOW (ref 39.0–52.0)
Hemoglobin: 8.6 g/dL — ABNORMAL LOW (ref 13.0–17.0)
Immature Granulocytes: 1 %
Lymphocytes Relative: 14 %
Lymphs Abs: 1.1 10*3/uL (ref 0.7–4.0)
MCH: 32 pg (ref 26.0–34.0)
MCHC: 34.5 g/dL (ref 30.0–36.0)
MCV: 92.6 fL (ref 80.0–100.0)
Monocytes Absolute: 0.8 10*3/uL (ref 0.1–1.0)
Monocytes Relative: 9 %
Neutro Abs: 5.9 10*3/uL (ref 1.7–7.7)
Neutrophils Relative %: 71 %
Platelets: 451 10*3/uL — ABNORMAL HIGH (ref 150–400)
RBC: 2.69 MIL/uL — ABNORMAL LOW (ref 4.22–5.81)
RDW: 12.5 % (ref 11.5–15.5)
WBC: 8.2 10*3/uL (ref 4.0–10.5)
nRBC: 0 % (ref 0.0–0.2)

## 2019-09-17 LAB — BASIC METABOLIC PANEL
Anion gap: 7 (ref 5–15)
BUN: 27 mg/dL — ABNORMAL HIGH (ref 8–23)
CO2: 20 mmol/L — ABNORMAL LOW (ref 22–32)
Calcium: 9.7 mg/dL (ref 8.9–10.3)
Chloride: 102 mmol/L (ref 98–111)
Creatinine, Ser: 1.2 mg/dL (ref 0.61–1.24)
GFR calc Af Amer: >60 mL/min (ref >60)
GFR calc non Af Amer: >60 mL/min (ref >60)
Glucose, Bld: 89 mg/dL (ref 70–99)
Potassium: 4 mmol/L (ref 3.5–5.1)
Sodium: 129 mmol/L — ABNORMAL LOW (ref 135–145)

## 2019-09-17 LAB — ECHOCARDIOGRAM COMPLETE
Height: 63 in
Weight: 1920 oz

## 2019-09-17 MED ORDER — SENNOSIDES-DOCUSATE SODIUM 8.6-50 MG PO TABS
1.0000 | ORAL_TABLET | Freq: Two times a day (BID) | ORAL | Status: DC
  Administered 2019-09-17 – 2019-09-21 (×9): 1 via ORAL
  Filled 2019-09-17 (×9): qty 1

## 2019-09-17 NOTE — Progress Notes (Signed)
  Echocardiogram 2D Echocardiogram has been performed.  Anthony Andrews 09/17/2019, 9:05 AM

## 2019-09-17 NOTE — Plan of Care (Signed)
  Problem: Education: Goal: Knowledge of General Education information will improve Description Including pain rating scale, medication(s)/side effects and non-pharmacologic comfort measures Outcome: Progressing   Problem: Activity: Goal: Risk for activity intolerance will decrease Outcome: Progressing   Problem: Safety: Goal: Ability to remain free from injury will improve Outcome: Progressing   

## 2019-09-17 NOTE — Plan of Care (Signed)

## 2019-09-17 NOTE — Progress Notes (Signed)
Eliquis started.  2D echo and carotid Dopplers ordered but not yet resulted.  We will see when the results of his noninvasive tests have been performed.  Lorretta Harp, M.D., [place], [facility], [place] [address] [place], [place]  [address]  [phone] 09/17/2019 9:37 AM

## 2019-09-17 NOTE — Progress Notes (Signed)
   Echo showed LVEF of 45-50% with severe hypokinesis of basal-mid inferior wall and grade 2 diastolic dysfunction.  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 Final reading pending.  Continue Eliquis.  Discussed with Dr. Gwenlyn Found. Given no high-grade stenosis of carotids, we can follow-up as patient. We will round on patient again tomorrow.   Darreld Mclean, PA-C 09/17/2019 4:26 PM

## 2019-09-17 NOTE — Progress Notes (Signed)
Physical Therapy Treatment Patient Details Name: Anthony Andrews MRN: [id number] DOB: [birth year] Today's Date: 09/17/2019    History of Present Illness Pt is a 73 y/o male admitted after falling at home. All imaging was negative for any acute findings; however, pt found to have Rhabdomyolysis after reportedly lying on the ground for 18 hours. PMH including but not limited to prior CVA with residual L sided weakness.    PT Comments    Pt was seen for gait and transfer training with monitoring of O2 sats.  Pt is feeling fairly weak, yet was able to walk with min assist on RW to get to the chair.  O2 sats are controlled on room air, and was able to demonstrate ability to sit with descent slowed with arms of chair.  Follow acutely for progression to longer and more independent walking to reduce the need to stay in SNF for rehab.   Follow Up Recommendations  SNF     Equipment Recommendations  None recommended by PT    Recommendations for Other Services       Precautions / Restrictions Precautions Precautions: Fall Precaution Comments: monitor O2 sats Restrictions Weight Bearing Restrictions: No    Mobility  Bed Mobility Overal bed mobility: Needs Assistance Bed Mobility: Supine to Sit;Sit to Supine     Supine to sit: Min assist     General bed mobility comments: used rail and supported trunk  Transfers Overall transfer level: Needs assistance Equipment used: Rolling walker (2 wheeled) Transfers: Sit to/from Stand Sit to Stand: Min guard         General transfer comment: min guard to steady  Ambulation/Gait Ambulation/Gait assistance: Min assist Gait Distance (Feet): 14 Feet(5+9) Assistive device: Rolling walker (2 wheeled) Gait Pattern/deviations: Step-to pattern;Step-through pattern;Decreased stride length;Wide base of support;Trunk flexed Gait velocity: decreased Gait velocity interpretation: <1.31 ft/sec, indicative of household Nurse, children's Rankin (Stroke Patients Only)       Balance Overall balance assessment: Needs assistance Sitting-balance support: Feet supported Sitting balance-Leahy Scale: Good     Standing balance support: Bilateral upper extremity supported;During functional activity Standing balance-Leahy Scale: Poor                              Cognition Arousal/Alertness: Awake/alert Behavior During Therapy: WFL for tasks assessed/performed Overall Cognitive Status: Within Functional Limits for tasks assessed                                 General Comments: tolerating gait only to get to the chair and then another short walk      Exercises      General Comments General comments (skin integrity, edema, etc.): RW with min assist and cues for posture and safety      Pertinent Vitals/Pain Pain Assessment: Faces Faces Pain Scale: No hurt    Home Living                      Prior Function            PT Goals (current goals can now be found in the care plan section) Acute Rehab PT Goals Patient Stated Goal: to get home Progress towards PT goals: Progressing toward goals    Frequency  Min 2X/week      PT Plan Current plan remains appropriate    Co-evaluation              AM-PAC PT "6 Clicks" Mobility   Outcome Measure  Help needed turning from your back to your side while in a flat bed without using bedrails?: A Little Help needed moving from lying on your back to sitting on the side of a flat bed without using bedrails?: A Little Help needed moving to and from a bed to a chair (including a wheelchair)?: A Little Help needed standing up from a chair using your arms (e.g., wheelchair or bedside chair)?: A Little Help needed to walk in hospital room?: A Little Help needed climbing 3-5 steps with a railing? : A Lot 6 Click Score: 17    End of Session Equipment Utilized During Treatment: Gait  belt Activity Tolerance: Patient tolerated treatment well Patient left: in chair;with call bell/phone within reach;with chair alarm set Nurse Communication: Mobility status PT Visit Diagnosis: Other abnormalities of gait and mobility (R26.89);Muscle weakness (generalized) (M62.81)     Time: 1405-1430 PT Time Calculation (min) (ACUTE ONLY): 25 min  Charges:  $Gait Training: 8-22 mins $Therapeutic Activity: 8-22 mins                    Ramond Dial 09/17/2019, 9:26 PM   Mee Hives, PT MS Acute Rehab Dept. Number: [place] and [place]

## 2019-09-17 NOTE — Progress Notes (Signed)
Carotid duplex  has been completed. Refer to [facility] under chart review to view preliminary results.   09/17/2019  3:11 PM Anthony Andrews, Anthony Andrews

## 2019-09-17 NOTE — Progress Notes (Addendum)
Ambulated patient on room air. O2 sats stayed between 91-95%, pulse rate 85. Patient was feeling weak and was not able to ambulate far. Pt ambulated in room towards door. Md Dr. Florencia Reasons Notified. Patient is now sitting in chair in room. No sob or resp. Distress is noted.

## 2019-09-17 NOTE — Progress Notes (Addendum)
PROGRESS NOTE  Anthony Andrews [id number] DOB: 10-Feb-1946 DOA: 09/11/2019 PCP: Patient, No Pcp Per  Brief summary:  Anthony Andrews is a 73 y.o. 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 Patient states he tripped over a door frame and fell on the ground.  He hit his head but did not lose consciousness.  He was down on the ground for approximately 18 hours.  He was found by his son who called EMS.   He is found to have AKI, rhabdomyolysis, acute metabolic encephalopathy, and hypoxia New diagnosis of atrial tachycardia versus A. Fib, cardiology consult requested  will need to go to skilled nursing facility, repeat Covid screening ordered  HPI/Recap of past 24 hours:  Oxygen supplement waned down from 6 L to 2 L this morning  He reports chronic cough for a year, denies chest pain He reports continue to smoke cigarettes   he is not a good historian, has poor memory, oriented x3    Assessment/Plan: Principal Problem:   Rhabdomyolysis Active Problems:   AKI (acute kidney injury) ([place])   Acute metabolic encephalopathy   Chronic hyponatremia   Metabolic acidosis   Acute respiratory failure with hypoxia ([place])   Atrial fibrillation ([place])   Unwitnessed fall at home -Patient states he tripped over a door frame and fell on the ground.  -Head, CT cervical spine no acute findings, UDS negative, alcohol level less than 10 -Does appear to have atrial tachycardia versus A. fib on telemetry,  Cardiology consulted  Atrial tachycardia / A. Fib, new diagnosis -Currently on Lopressor - cardiology consulted, will follow recommendation  Acute hypoxic respiratory failure -not on home o2 ,  - history of smoking emphysema -report choking episode, he did well with speech therapy and barium swallow study - cta chest no PE, but showed bilateral pleural effusion and groundglass opacity edema versus infection, doubt infection  ,procalcitonin less than 0.1 , no leukocytosis, no fever -Echo pending Wean oxygen, will check ambulating o2 on room air  Acute on chronic hyponatremia Sodium 129 on presentation Likely multifactorial he has poor oral intake, will also have underlying heart failure echocardiogram is pending  Rhabdomyolysis/abdominal FLT Lab work normalized  Acute metabolic encephalopathy, with possible underlying vascular dementia, son report noticed patient had memory issues for a few month -Per H&P "patient knows his name.  He is aware that he is at a hospital.  Does not know which city he is in.  Not oriented to time.  He thinks it is 2010.  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 Fib, will benefit from anticoagulation for stroke reduction, will follow cardiology recommendation  HTN:  Home meds Norvasc and losartan held since admission He is currently on Lopressor, blood pressure stable  Cigarette smoking - smoking cessation education provided ,currently on nicotine patch  DVT Prophylaxis: was on Lovenox, now  on Eliquis  Code Status: Full  Family Communication: patient , son Iona Beard over the phone  Disposition Plan: snf discharge on hold, continue wean oxygen, cardiology consult  Possible discharge to skilled nursing facility tomorrow December 25 if cleared by  cardiology   Consultants:  Cardiology  Procedures:  None  Antibiotics:  None   Objective: BP 133/78 (BP Location: Left Arm)   Pulse 83   Temp 98.1 F (36.7 C) (Oral)   Resp 17   Ht 5\' 3"  (1.6 m)   Wt 54.4 kg   SpO2 92%   BMI 21.26 kg/m   Intake/Output Summary (Last 24 hours) at 09/17/2019 [id number] Last data filed at 09/17/2019 0700 Gross per 24 hour  Intake 480 ml  Output 1060 ml  Net -580 ml   Filed Weights   09/11/19 1818  Weight: 54.4 kg    Exam: Patient is examined daily including today on 09/17/2019, exams remain the same as of yesterday except that has changed    General: Frail, chronic ill-appearing, thin, NAD  Cardiovascular: RRR  Respiratory: Diminished at bases, no rhonchi ,no wheezing  Abdomen: Soft/ND/NT, positive BS  Musculoskeletal: No Edema  Neuro: alert, oriented x3, impaired memory  Data Reviewed: Basic Metabolic Panel: Recent Labs  Lab 09/13/19 0018 09/14/19 0537 09/15/19 0411 09/16/19 0417 09/17/19 0241  NA 130* 130* 133* 132* 129*  K 3.7 3.9 4.0 4.5 4.0  CL 103 104 106 103 102  CO2 17* 18* 19* 22 20*  GLUCOSE 87 97 91 97 89  BUN 24* 22 23 27* 27*  CREATININE 1.37* 1.20 1.29* 1.27* 1.20  CALCIUM 9.5 9.0 9.5 9.9 9.7   Liver Function Tests: Recent Labs  Lab 09/11/19 1838 09/12/19 0652 09/13/19 0018 09/14/19 0537 09/15/19 0411  AST 194* 125* 96* 47* 32  ALT 58* 47* 48* 34 29  ALKPHOS 72 54 58 47 52  BILITOT 0.5 0.3 0.4 0.6 0.7  PROT 7.0 5.1* 5.7* 4.9* 4.9*  ALBUMIN 2.8* 2.1* 2.4* 1.9* 1.9*   No results for input(s): LIPASE, AMYLASE in the last 168 hours. Recent Labs  Lab 09/12/19 0652  AMMONIA 10   CBC: Recent Labs  Lab 09/11/19 1838 09/12/19 0652 09/13/19 0018 09/14/19 0537 09/16/19 0417 09/17/19 0241  WBC 12.9* 8.9 9.1 9.5 8.8 8.2  NEUTROABS 10.9*  --   --  7.1  --  5.9  HGB 11.7* 9.0* 10.1* 9.2* 8.5* 8.6*  HCT 33.5* 27.0* 29.5* 27.2* 25.5* 24.9*  MCV 93.8 95.4 93.7 95.4 95.1 92.6  PLT 532* 465*  457* 435* 447* 451*   Cardiac Enzymes:   Recent Labs  Lab 09/11/19 1838 09/12/19 0652 09/13/19 0018 09/14/19 0537 09/15/19 0411  CKTOTAL 4,536* 2,034* 1,002* 213 106   BNP (last 3 results) No results for input(s): BNP in the last 8760 hours.  ProBNP (last 3 results) No results for input(s): PROBNP in the last 8760 hours.  CBG: No results for input(s): GLUCAP in the last 168 hours.  Recent Results (from the past 240 hour(s))  SARS CORONAVIRUS 2 (TAT 6-24 HRS) Nasopharyngeal Nasopharyngeal Swab     Status: None   Collection Time: 09/11/19 11:18 PM   Specimen: Nasopharyngeal Swab  Result Value Ref Range Status   SARS Coronavirus 2 NEGATIVE NEGATIVE Final    Comment: (NOTE) SARS-CoV-2 target nucleic acids are NOT DETECTED. The SARS-CoV-2 RNA is generally detectable in upper and lower respiratory specimens during the acute phase of infection. Negative results do not preclude SARS-CoV-2 infection, do not rule out  co-infections with other pathogens, and should not be used as the sole basis for treatment or other patient management decisions. Negative results must be combined with clinical observations, patient history, and epidemiological information. The expected result is Negative. Fact Sheet for Patients: SugarRoll.be Fact Sheet for Healthcare Providers: https://www.woods-mathews.com/ This test is not yet approved or cleared by the [place] FDA and  has been authorized for detection and/or diagnosis of SARS-CoV-2 by FDA under an Emergency Use Authorization (EUA). This EUA will remain  in effect (meaning this test can be used) for the duration of the COVID-19 declaration under Section 56 4(b)(1) of the Act, 21 U.S.C. section 360bbb-3(b)(1), unless the authorization is terminated or revoked sooner. Performed at [facility], [place] [address], [place], [place] [address]   SARS CORONAVIRUS 2 (TAT 6-24 HRS) Nasopharyngeal  Nasopharyngeal Swab     Status: None   Collection Time: 09/14/19 11:15 AM   Specimen: Nasopharyngeal Swab  Result Value Ref Range Status   SARS Coronavirus 2 NEGATIVE NEGATIVE Final    Comment: (NOTE) SARS-CoV-2 target nucleic acids are NOT DETECTED. The SARS-CoV-2 RNA is generally detectable in upper and lower respiratory specimens during the acute phase of infection. Negative results do not preclude SARS-CoV-2 infection, do not rule out co-infections with other pathogens, and should not be used as the sole basis for treatment or other patient management decisions. Negative results must be combined with clinical observations, patient history, and epidemiological information. The expected result is Negative. Fact Sheet for Patients: SugarRoll.be Fact Sheet for Healthcare Providers: https://www.woods-mathews.com/ This test is not yet approved or cleared by the [place] FDA and  has been authorized for detection and/or diagnosis of SARS-CoV-2 by FDA under an Emergency Use Authorization (EUA). This EUA will remain  in effect (meaning this test can be used) for the duration of the COVID-19 declaration under Section 56 4(b)(1) of the Act, 21 U.S.C. section 360bbb-3(b)(1), unless the authorization is terminated or revoked sooner. Performed at [facility], [place] [address] [place], [place] [address]   SARS CORONAVIRUS 2 (TAT 6-24 HRS) Nasopharyngeal Nasopharyngeal Swab     Status: None   Collection Time: 09/16/19  4:22 PM   Specimen: Nasopharyngeal Swab  Result Value Ref Range Status   SARS Coronavirus 2 NEGATIVE NEGATIVE Final    Comment: (NOTE) SARS-CoV-2 target nucleic acids are NOT DETECTED. The SARS-CoV-2 RNA is generally detectable in upper and lower respiratory specimens during the acute phase of infection. Negative results do not preclude SARS-CoV-2 infection, do not rule out co-infections with other pathogens, and should not  be used as the sole basis for treatment or other patient management decisions. Negative results must be combined with clinical observations, patient history, and epidemiological information. The expected result is Negative. Fact Sheet for Patients: SugarRoll.be Fact Sheet for Healthcare Providers: https://www.woods-mathews.com/ This test is not yet approved or cleared by the [place] FDA and  has been authorized for detection and/or diagnosis of SARS-CoV-2 by FDA under an Emergency Use Authorization (EUA). This EUA will remain  in effect (meaning this test can be used) for the duration of the COVID-19 declaration under Section 56 4(b)(1) of the Act, 21 U.S.C. section 360bbb-3(b)(1), unless the authorization is terminated or revoked sooner. Performed at [facility], [place] [address] [place], [place] [address]      Studies: CT ANGIO CHEST PE W OR WO CONTRAST  Result Date: 09/16/2019 CLINICAL DATA:  Hypoxemia EXAM: CT ANGIOGRAPHY CHEST WITH CONTRAST TECHNIQUE: Multidetector  CT imaging of the chest was performed using the standard protocol during bolus administration of intravenous contrast. Multiplanar CT image reconstructions and MIPs were obtained to evaluate the vascular anatomy. CONTRAST:  164mL OMNIPAQUE IOHEXOL 350 MG/ML SOLN COMPARISON:  None. FINDINGS: Cardiovascular: There is a optimal opacification of the pulmonary arteries. There is no central,segmental, or subsegmental filling defects within the pulmonary arteries. There is mild cardiomegaly. No evidence of right ventricular heart strain. Coronary artery and aortic valve calcifications are seen. Scattered aortic atherosclerosis is noted. There is a tortuous intrathoracic descending aorta is seen. There is normal three-vessel brachiocephalic anatomy without proximal stenosis. Mediastinum/Nodes: There is scattered subcarinal and right hilar lymph nodes present the largest in the right  hilum measures 1.2 cm and within the prevascular space 1.2 cm. Thyroid gland, trachea, and esophagus demonstrate no significant findings. Lungs/Pleura: There is extensive centrilobular emphysematous changes seen within both lung apices. There is ground-glass opacities seen throughout the mid to lower lungs with patchy airspace opacity seen dependently in the posterior lower lungs with small air bronchograms in the posterior right lung. There are small bilateral pleural effusions present. Upper Abdomen: No acute abnormalities present in the visualized portions of the upper abdomen. A partially visualized low-density lesion seen in the pole the left kidney. Musculoskeletal: No chest wall abnormality. No acute or significant osseous findings. Review of the MIP images confirms the above findings. IMPRESSION: 1. No central, segmental, subsegmental pulmonary embolism. 2. Ground-glass opacity with patchy posterior airspace consolidation which could be due to asymmetric edema, or multifocal infectious etiology. 3. Small bilateral pleural effusions. Aortic Atherosclerosis (ICD10-I70.0) and Emphysema (ICD10-J43.9). Electronically Signed   By: Prudencio Pair M.D.   On: 09/16/2019 19:29   DG Swallowing Func-Speech Pathology  Result Date: 09/16/2019 Objective Swallowing Evaluation: Type of Study: MBS-Modified Barium Swallow Study  Patient Details Name: Anthony Andrews MRN: [id number] Date of Birth: 03-06-1946 Today's Date: 09/16/2019 Time: SLP Start Time (ACUTE ONLY): 1058 -SLP Stop Time (ACUTE ONLY): 1112 SLP Time Calculation (min) (ACUTE ONLY): 14 min Past Medical History: Past Medical History: Diagnosis Date . Acute metabolic encephalopathy  . AKI (acute kidney injury) ([place])  . Chronic hyponatremia  . Rhabdomyolysis  . Stroke ([place])  . Tobacco use  Past Surgical History: Past Surgical History: Procedure Laterality Date . BACK SURGERY   . SHOULDER SURGERY   HPI: 73yo male admitted 09/11/2019 due to a fall where he hit his head  but did not lose consciousness. He was on the floor for approx 18 hours. PMH: R CVA with residual LLE weakness  Subjective: Pt awake, alert, pleasant, participative Assessment / Plan / Recommendation CHL IP CLINICAL IMPRESSIONS 09/16/2019 Clinical Impression Pt presents with mild oropharyngeal dysphagia c/b reduced lingual strength and coordination, premature spillage, delayed swallow initiation, incomplete laryngeal closure, and reduced laryngeal elevation and excursion.  These deficits resulted in audible aspiration of thin liquid during the swallow.  Cough reflex was effecitve in clearing aspiration.  Single cup sips, prevented aspiration and  intermittently resulted in trace amount of transient penetration only.  There was aspiration with straw, and with serial cup sips. There was no penetration of serial cup sips of nectar thick liquid.  With pill simulation there was a gross amount of aspiration of thin liquid washed used with tablet.  Pt was unable to orally transit tablet with thin liquid or puree, and pill was expectorated on both trials.  Pills to be given with puree, whole if tolerated, and crushed if necessary. There was no penetration or  aspiration noted with puree or solid textures.  Pt is agreeable to continuing current diet with use of strict aspiration precautions as listed below. Recommend regular texture diet with thin liquid by SINGLE CUP SIPS only.  If pt is unable to execute compensatory strategies, or exhibits continued difficult with thin liquid, please downgrade to nectar thick liquids. SLP Visit Diagnosis Dysphagia, oropharyngeal phase (R13.12) Attention and concentration deficit following -- Frontal lobe and executive function deficit following -- Impact on safety and function Mild aspiration risk   CHL IP TREATMENT RECOMMENDATION 09/16/2019 Treatment Recommendations Therapy as outlined in treatment plan below   Prognosis 09/16/2019 Prognosis for Safe Diet Advancement Fair Barriers to  Reach Goals Time post onset Barriers/Prognosis Comment -- CHL IP DIET RECOMMENDATION 09/16/2019 SLP Diet Recommendations Regular solids;Thin liquid Liquid Administration via Cup;No straw Medication Administration Whole meds with puree Compensations Slow rate;Small sips/bites Postural Changes Seated upright at 90 degrees   CHL IP OTHER RECOMMENDATIONS 09/16/2019 Recommended Consults -- Oral Care Recommendations Oral care BID Other Recommendations --   CHL IP FOLLOW UP RECOMMENDATIONS 09/16/2019 Follow up Recommendations (No Data)   No flowsheet data found.     CHL IP ORAL PHASE 09/16/2019 Oral Phase Impaired Oral - Pudding Teaspoon -- Oral - Pudding Cup -- Oral - Honey Teaspoon -- Oral - Honey Cup -- Oral - Nectar Teaspoon -- Oral - Nectar Cup Lingual pumping;Decreased bolus cohesion;Premature spillage Oral - Nectar Straw -- Oral - Thin Teaspoon -- Oral - Thin Cup Lingual pumping;Decreased bolus cohesion;Premature spillage Oral - Thin Straw Decreased bolus cohesion;Premature spillage Oral - Puree Premature spillage Oral - Mech Soft -- Oral - Regular Piecemeal swallowing;Premature spillage Oral - Multi-Consistency -- Oral - Pill Weak lingual manipulation;Lingual pumping;Reduced posterior propulsion;Holding of bolus;Lingual/palatal residue;Delayed oral transit;Decreased bolus cohesion;Premature spillage Oral Phase - Comment --  CHL IP PHARYNGEAL PHASE 09/16/2019 Pharyngeal Phase Impaired Pharyngeal- Pudding Teaspoon -- Pharyngeal -- Pharyngeal- Pudding Cup -- Pharyngeal -- Pharyngeal- Honey Teaspoon -- Pharyngeal -- Pharyngeal- Honey Cup -- Pharyngeal -- Pharyngeal- Nectar Teaspoon -- Pharyngeal -- Pharyngeal- Nectar Cup Delayed swallow initiation-vallecula;Delayed swallow initiation-pyriform sinuses Pharyngeal Material does not enter airway Pharyngeal- Nectar Straw -- Pharyngeal -- Pharyngeal- Thin Teaspoon -- Pharyngeal -- Pharyngeal- Thin Cup Delayed swallow initiation-pyriform sinuses;Delayed swallow  initiation-vallecula;Reduced airway/laryngeal closure;Reduced laryngeal elevation;Penetration/Aspiration during swallow;Trace aspiration Pharyngeal Material does not enter airway;Material enters airway, remains ABOVE vocal cords then ejected out Pharyngeal- Thin Straw Delayed swallow initiation-vallecula;Reduced laryngeal elevation;Reduced airway/laryngeal closure;Penetration/Aspiration before swallow;Penetration/Aspiration during swallow;Moderate aspiration Pharyngeal Material enters airway, passes BELOW cords then ejected out Pharyngeal- Puree Delayed swallow initiation-vallecula Pharyngeal Material does not enter airway Pharyngeal- Mechanical Soft -- Pharyngeal -- Pharyngeal- Regular Delayed swallow initiation-vallecula Pharyngeal Material does not enter airway Pharyngeal- Multi-consistency -- Pharyngeal -- Pharyngeal- Pill Delayed swallow initiation-pyriform sinuses;Reduced epiglottic inversion;Reduced anterior laryngeal mobility;Reduced laryngeal elevation;Reduced airway/laryngeal closure;Penetration/Aspiration before swallow;Penetration/Aspiration during swallow;Penetration/Apiration after swallow;Significant aspiration (Amount) Pharyngeal Material enters airway, passes BELOW cords and not ejected out despite cough attempt by patient;Material enters airway, passes BELOW cords then ejected out Pharyngeal Comment --  CHL IP CERVICAL ESOPHAGEAL PHASE 09/16/2019 Cervical Esophageal Phase WFL Pudding Teaspoon -- Pudding Cup -- Honey Teaspoon -- Honey Cup -- Nectar Teaspoon -- Nectar Cup -- Nectar Straw -- Thin Teaspoon -- Thin Cup -- Thin Straw -- Puree -- Mechanical Soft -- Regular -- Multi-consistency -- Pill -- Cervical Esophageal Comment -- Celedonio Savage, MA, [place] Office: [phone] 09/16/2019, 11:53 AM               Scheduled  Meds: . apixaban  5 mg Oral BID  . guaiFENesin  600 mg Oral BID  . mouth rinse  15 mL Mouth Rinse BID  . metoprolol tartrate  12.5 mg Oral BID   . multivitamin with minerals  1 tablet Oral Daily  . nicotine  7 mg Transdermal Daily  . PARoxetine  20 mg Oral QPM  . rosuvastatin  10 mg Oral QHS  . senna-docusate  1 tablet Oral BID    Continuous Infusions:   Time spent: 35mins I have personally reviewed and interpreted on  09/17/2019 daily labs, tele strips, imagings as discussed above under date review session and assessment and plans.  I reviewed all nursing notes, pharmacy notes, consultant notes,  vitals, pertinent old records  I have discussed plan of care as described above with RN , patient and family on 09/17/2019   Florencia Reasons MD, PhD, FACP  [facility]  [email or web address], password [facility] 09/17/2019, 9:06 AM  LOS: 4 days

## 2019-09-18 ENCOUNTER — Other Ambulatory Visit: Payer: Self-pay | Admitting: Nurse Practitioner

## 2019-09-18 ENCOUNTER — Inpatient Hospital Stay (HOSPITAL_COMMUNITY): Payer: Medicare PPO

## 2019-09-18 DIAGNOSIS — I441 Atrioventricular block, second degree: Secondary | ICD-10-CM

## 2019-09-18 DIAGNOSIS — I6522 Occlusion and stenosis of left carotid artery: Secondary | ICD-10-CM

## 2019-09-18 DIAGNOSIS — I519 Heart disease, unspecified: Secondary | ICD-10-CM

## 2019-09-18 LAB — BASIC METABOLIC PANEL
Anion gap: 8 (ref 5–15)
BUN: 29 mg/dL — ABNORMAL HIGH (ref 8–23)
CO2: 21 mmol/L — ABNORMAL LOW (ref 22–32)
Calcium: 10.1 mg/dL (ref 8.9–10.3)
Chloride: 103 mmol/L (ref 98–111)
Creatinine, Ser: 1.34 mg/dL — ABNORMAL HIGH (ref 0.61–1.24)
GFR calc Af Amer: >60 mL/min (ref >60)
GFR calc non Af Amer: 53 mL/min — ABNORMAL LOW (ref >60)
Glucose, Bld: 89 mg/dL (ref 70–99)
Potassium: 4.3 mmol/L (ref 3.5–5.1)
Sodium: 132 mmol/L — ABNORMAL LOW (ref 135–145)

## 2019-09-18 LAB — OCCULT BLOOD X 1 CARD TO LAB, STOOL: Fecal Occult Bld: NEGATIVE

## 2019-09-18 MED ORDER — BISACODYL 10 MG RE SUPP
10.0000 mg | Freq: Once | RECTAL | Status: AC
  Administered 2019-09-18: 10 mg via RECTAL
  Filled 2019-09-18: qty 1

## 2019-09-18 MED ORDER — TAMSULOSIN HCL 0.4 MG PO CAPS
0.4000 mg | ORAL_CAPSULE | Freq: Every day | ORAL | Status: DC
  Administered 2019-09-19 – 2019-09-21 (×3): 0.4 mg via ORAL
  Filled 2019-09-18 (×3): qty 1

## 2019-09-18 MED ORDER — POLYETHYLENE GLYCOL 3350 17 G PO PACK
17.0000 g | PACK | Freq: Every day | ORAL | Status: AC
  Administered 2019-09-18 – 2019-09-19 (×2): 17 g via ORAL
  Filled 2019-09-18 (×2): qty 1

## 2019-09-18 MED ORDER — SODIUM BICARBONATE 650 MG PO TABS
650.0000 mg | ORAL_TABLET | Freq: Two times a day (BID) | ORAL | Status: DC
  Administered 2019-09-19 – 2019-09-21 (×5): 650 mg via ORAL
  Filled 2019-09-18 (×5): qty 1

## 2019-09-18 NOTE — Progress Notes (Addendum)
PROGRESS NOTE  Anthony Andrews [id number] DOB: 09-04-1946 DOA: 09/11/2019 PCP: Patient, No Pcp Per  Brief summary:  Anthony Andrews is a 73 y.o. 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 Patient states he tripped over a door frame and fell on the ground.  He hit his head but did not lose consciousness.  He was down on the ground for approximately 18 hours.  He was found by his son who called EMS.   He is found to have AKI, rhabdomyolysis, acute metabolic encephalopathy, and hypoxia New diagnosis of atrial tachycardia versus A. Fib, cardiology consult requested  will need to go to skilled nursing facility, repeat Covid screening ordered  HPI/Recap of past 24 hours:  No acute complaints, denies chest pain or sob He is now on room air  has bradycardia on tele last night, does not appear symptomatic   Assessment/Plan: Principal Problem:   Rhabdomyolysis Active Problems:   AKI (acute kidney injury) ([facility])   Acute metabolic encephalopathy   Chronic hyponatremia   Metabolic acidosis   Acute respiratory failure with hypoxia ([facility])   Atrial fibrillation ([place])   Unwitnessed fall at home -Patient states he tripped over a door frame and fell on the ground.  -Head, CT cervical spine no acute findings, UDS negative, alcohol level less than 10 -cardiology will arrange event monitor  Atrial tachycardia / A. Fib/bradycardia new diagnosis -- cardiology consulted input appreciated, patient is started on eliquis, continue low dose lopressor, cardiology will arrange event monitor -detail please refer to cardiology Dr Gwenlyn Found note from 12/25  Acute hypoxic respiratory failure, resolved -- history of smoking emphysema -report choking episode, he did well with speech therapy and barium swallow study - cta chest no PE, but showed bilateral pleural effusion and groundglass opacity edema versus infection, doubt infection  ,procalcitonin less than 0.1 , no leukocytosis, no fever -Echo lvef 45%  Acute on chronic hyponatremia Sodium 129 on presentation Likely multifactorial he has poor oral intake  Rhabdomyolysis/abdominal FLT Lab work normalized   AKI on CKD 2 -Likely from rhabdomyolysis, UA obtained on admission no sign of infection -Improved, renal dosing meds Cr fluctuating, will get renal US, bladder scan  Acute metabolic encephalopathy, with possible underlying vascular dementia, son report noticed patient had memory issues for a few month -Per H&P "patient knows his name.  He is aware that he is at a hospital.  Does not know which city he is in.  Not oriented to time.  He thinks it is 2010.  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,  -now on eliquis due to afib diagnosis -continue Crestor  -f/u with neurology   HTN:  Home meds Norvasc and losartan held since admission He is currently on Lopressor, blood pressure stable  Cigarette smoking - smoking cessation education provided ,currently on nicotine patch  DVT Prophylaxis: was on Lovenox, now on Eliquis  Code Status: Full  Family Communication: patient   Disposition Plan: snf    Consultants:  Cardiology  Procedures:  None  Antibiotics:  None   Objective: BP 122/69 (BP Location: Left  Arm)   Pulse 77   Temp 98.3 F (36.8 C) (Oral)   Resp 17   Ht 5\' 3"  (1.6 m)   Wt 54.4 kg   SpO2 93%   BMI 21.26 kg/m   Intake/Output Summary (Last 24 hours) at 09/18/2019 1103 Last data filed at 09/18/2019 0400 Gross per 24 hour  Intake 240 ml  Output 1050 ml  Net -810 ml   Filed Weights   09/11/19  1818  Weight: 54.4 kg    Exam: Patient is examined daily including today on 09/18/2019, exams remain the same as of yesterday except that has changed    General: Frail, chronic ill-appearing, thin, NAD  Cardiovascular: RRR  Respiratory: Diminished at bases, no rhonchi ,no wheezing  Abdomen: Soft/ND/NT, positive BS  Musculoskeletal: No Edema  Neuro: alert, oriented x3, impaired memory  Data Reviewed: Basic Metabolic Panel: Recent Labs  Lab 09/14/19 0537 09/15/19 0411 09/16/19 0417 09/17/19 0241 09/18/19 0353  NA 130* 133* 132* 129* 132*  K 3.9 4.0 4.5 4.0 4.3  CL 104 106 103 102 103  CO2 18* 19* 22 20* 21*  GLUCOSE 97 91 97 89 89  BUN 22 23 27* 27* 29*  CREATININE 1.20 1.29* 1.27* 1.20 1.34*  CALCIUM 9.0 9.5 9.9 9.7 10.1   Liver Function Tests: Recent Labs  Lab 09/11/19 1838 09/12/19 0652 09/13/19 0018 09/14/19 0537 09/15/19 0411  AST 194* 125* 96* 47* 32  ALT 58* 47* 48* 34 29  ALKPHOS 72 54 58 47 52  BILITOT 0.5 0.3 0.4 0.6 0.7  PROT 7.0 5.1* 5.7* 4.9* 4.9*  ALBUMIN 2.8* 2.1* 2.4* 1.9* 1.9*   No results for input(s): LIPASE, AMYLASE in the last 168 hours. Recent Labs  Lab 09/12/19 0652  AMMONIA 10   CBC: Recent Labs  Lab 09/11/19 1838 09/12/19 0652 09/13/19 0018 09/14/19 0537 09/16/19 0417 09/17/19 0241  WBC 12.9* 8.9 9.1 9.5 8.8 8.2  NEUTROABS 10.9*  --   --  7.1  --  5.9  HGB 11.7* 9.0* 10.1* 9.2* 8.5* 8.6*  HCT 33.5* 27.0* 29.5* 27.2* 25.5* 24.9*  MCV 93.8 95.4 93.7 95.4 95.1 92.6  PLT 532* 465* 457* 435* 447* 451*   Cardiac Enzymes:   Recent Labs  Lab 09/11/19 1838 09/12/19 0652 09/13/19 0018 09/14/19 0537 09/15/19 0411  CKTOTAL 4,536* 2,034* 1,002* 213 106   BNP (last 3 results) No results for input(s): BNP in the last 8760 hours.  ProBNP (last 3 results) No results for input(s): PROBNP in the last 8760 hours.  CBG: No results for input(s): GLUCAP in the last 168 hours.  Recent Results (from the past 240 hour(s))    SARS CORONAVIRUS 2 (TAT 6-24 HRS) Nasopharyngeal Nasopharyngeal Swab     Status: None   Collection Time: 09/11/19 11:18 PM   Specimen: Nasopharyngeal Swab  Result Value Ref Range Status   SARS Coronavirus 2 NEGATIVE NEGATIVE Final    Comment: (NOTE) SARS-CoV-2 target nucleic acids are NOT DETECTED. The SARS-CoV-2 RNA is generally detectable in upper and lower respiratory specimens during the acute phase of infection. Negative results do not preclude SARS-CoV-2 infection, do not rule out co-infections with other pathogens, and should not be used as the sole basis for treatment or other patient management decisions. Negative results must be combined with clinical observations, patient history, and epidemiological information. The expected result is Negative. Fact Sheet for Patients: SugarRoll.be Fact Sheet for Healthcare Providers: https://www.woods-mathews.com/ This test is not yet approved or cleared by the [place] FDA  and  has been authorized for detection and/or diagnosis of SARS-CoV-2 by FDA under an Emergency Use Authorization (EUA). This EUA will remain  in effect (meaning this test can be used) for the duration of the COVID-19 declaration under Section 56 4(b)(1) of the Act, 21 U.S.C. section 360bbb-3(b)(1), unless the authorization is terminated or revoked sooner. Performed at [facility], [place] [address], [place], [place] [address]   SARS CORONAVIRUS 2 (TAT 6-24 HRS) Nasopharyngeal Nasopharyngeal Swab     Status: None   Collection Time: 09/14/19 11:15 AM   Specimen: Nasopharyngeal Swab  Result Value Ref Range Status   SARS Coronavirus 2 NEGATIVE NEGATIVE Final    Comment: (NOTE) SARS-CoV-2 target nucleic acids are NOT DETECTED. The SARS-CoV-2 RNA is generally detectable in upper and lower respiratory specimens during the acute phase of infection. Negative results do not preclude SARS-CoV-2 infection, do not rule  out co-infections with other pathogens, and should not be used as the sole basis for treatment or other patient management decisions. Negative results must be combined with clinical observations, patient history, and epidemiological information. The expected result is Negative. Fact Sheet for Patients: SugarRoll.be Fact Sheet for Healthcare Providers: https://www.woods-mathews.com/ This test is not yet approved or cleared by the [place] FDA and  has been authorized for detection and/or diagnosis of SARS-CoV-2 by FDA under an Emergency Use Authorization (EUA). This EUA will remain  in effect (meaning this test can be used) for the duration of the COVID-19 declaration under Section 56 4(b)(1) of the Act, 21 U.S.C. section 360bbb-3(b)(1), unless the authorization is terminated or revoked sooner. Performed at [facility], [place] [address], [place], [place] [address]   SARS CORONAVIRUS 2 (TAT 6-24 HRS) Nasopharyngeal Nasopharyngeal Swab     Status: None   Collection Time: 09/16/19  4:22 PM   Specimen: Nasopharyngeal Swab  Result Value Ref Range Status   SARS Coronavirus 2 NEGATIVE NEGATIVE Final    Comment: (NOTE) SARS-CoV-2 target nucleic acids are NOT DETECTED. The SARS-CoV-2 RNA is generally detectable in upper and lower respiratory specimens during the acute phase of infection. Negative results do not preclude SARS-CoV-2 infection, do not rule out co-infections with other pathogens, and should not be used as the sole basis for treatment or other patient management decisions. Negative results must be combined with clinical observations, patient history, and epidemiological information. The expected result is Negative. Fact Sheet for Patients: SugarRoll.be Fact Sheet for Healthcare Providers: https://www.woods-mathews.com/ This test is not yet approved or cleared by the [place] FDA and   has been authorized for detection and/or diagnosis of SARS-CoV-2 by FDA under an Emergency Use Authorization (EUA). This EUA will remain  in effect (meaning this test can be used) for the duration of the COVID-19 declaration under Section 56 4(b)(1) of the Act, 21 U.S.C. section 360bbb-3(b)(1), unless the authorization is terminated or revoked sooner. Performed at [facility], [place] [address] [place], [place] [address]      Studies: VAS US CAROTID  Result Date: 09/17/2019 Carotid Arterial Duplex Study Indications:  Bilateral bruits and Weakness. 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 Performing Technologist: Oda Cogan RDMS, RVT  Examination Guidelines: A complete evaluation includes B-mode imaging, spectral Doppler, color Doppler, and power Doppler as needed of all accessible portions of each vessel. Bilateral testing is considered an integral part of a complete examination. Limited examinations for reoccurring indications may be performed as noted.  Right Carotid Findings: +----------+--------+--------+--------+------------------+------------------+  PSV cm/sEDV cm/sStenosisPlaque DescriptionComments           +----------+--------+--------+--------+------------------+------------------+ CCA Prox  94      22                                                   +----------+--------+--------+--------+------------------+------------------+ CCA Distal48      17                                intimal thickening +----------+--------+--------+--------+------------------+------------------+ ICA Prox  122     45      40-59%  heterogenous                         +----------+--------+--------+--------+------------------+------------------+ ICA Mid   115     51      40-59%  heterogenous                         +----------+--------+--------+--------+------------------+------------------+ ICA Distal89      27                                                    +----------+--------+--------+--------+------------------+------------------+ ECA       458     58      >50%    heterogenous                         +----------+--------+--------+--------+------------------+------------------+ +----------+--------+-------+----------------+-------------------+           PSV cm/sEDV cmsDescribe        Arm Pressure (mmHG) +----------+--------+-------+----------------+-------------------+ [id number]            Multiphasic, WNL                    +----------+--------+-------+----------------+-------------------+ +---------+--------+---+--------+--+---------+ VertebralPSV cm/s111EDV cm/s38Antegrade +---------+--------+---+--------+--+---------+  Left Carotid Findings: +----------+--------+--------+--------+------------------+------------------+           PSV cm/sEDV cm/sStenosisPlaque DescriptionComments           +----------+--------+--------+--------+------------------+------------------+ CCA Prox  83      13                                                   +----------+--------+--------+--------+------------------+------------------+ CCA Distal47      10                                intimal thickening +----------+--------+--------+--------+------------------+------------------+ ICA Prox  222     62      60-79%  heterogenous      close to 60%       +----------+--------+--------+--------+------------------+------------------+ ICA Mid   132     33                                                   +----------+--------+--------+--------+------------------+------------------+  ICA Distal73      23                                                   +----------+--------+--------+--------+------------------+------------------+ ECA       202     28                                                   +----------+--------+--------+--------+------------------+------------------+  +----------+--------+--------+----------------+-------------------+           PSV cm/sEDV cm/sDescribe        Arm Pressure (mmHG) +----------+--------+--------+----------------+-------------------+ [id number]             Multiphasic, WNL                    +----------+--------+--------+----------------+-------------------+ +---------+--------+--+--------+-+---------+ VertebralPSV cm/s39EDV cm/s6Antegrade +---------+--------+--+--------+-+---------+  Summary: Right Carotid: Velocities in the right ICA are consistent 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 The ECA appears >50% stenosed. Left Carotid: Velocities in the left ICA are consistent with a 60-79% stenosis. Vertebrals: Bilateral vertebral arteries demonstrate antegrade flow. *See table(s) above for measurements and observations.  Electronically signed by Monica Martinez MD on 09/17/2019 at 5:20:01 PM.    Final     Scheduled Meds: . apixaban  5 mg Oral BID  . guaiFENesin  600 mg Oral BID  . mouth rinse  15 mL Mouth Rinse BID  . metoprolol tartrate  12.5 mg Oral BID  . multivitamin with minerals  1 tablet Oral Daily  . nicotine  7 mg Transdermal Daily  . PARoxetine  20 mg Oral QPM  . polyethylene glycol  17 g Oral Daily  . rosuvastatin  10 mg Oral QHS  . senna-docusate  1 tablet Oral BID    Continuous Infusions:   Time spent: 19mins I have personally reviewed and interpreted on  09/18/2019 daily labs, tele strips, imagings as discussed above under date review session and assessment and plans.  I reviewed all nursing notes, pharmacy notes, consultant notes,  vitals, pertinent old records  I have discussed plan of care as described above with RN , patient  on 09/18/2019   Florencia Reasons MD, PhD, FACP  [facility]  [email or web address], password [facility] 09/18/2019, 11:03 AM  LOS: 5 days

## 2019-09-18 NOTE — Care Management (Signed)
Left message with [facility] admissions coordinator regarding possible admission today.  Possible delay of services due to Christmas holiday.  Will provide updates as available.   Reinaldo Raddle, RN, BSN  Trauma/Neuro ICU Case Manager [phone]

## 2019-09-18 NOTE — Progress Notes (Signed)
Telemetry called, pt had 2nd degree type 2 block. Pt assessed, pt VS stable, pt states he feels fine. Dr. Sharlet Salina notified. Will continue to monitor.

## 2019-09-18 NOTE — Progress Notes (Addendum)
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

## 2019-09-18 NOTE — Care Management (Signed)
Received word back from [facility] regarding possible admission today; facility unable to take patient, as their pharmacy is closed.  They will not be able to admit until Monday.  Debbie with [facility] to check with [facility] regarding extension for insurance authorization.   Reinaldo Raddle, RN, BSN  Trauma/Neuro ICU Case Manager [phone]

## 2019-09-18 NOTE — Discharge Instructions (Signed)

## 2019-09-18 NOTE — Progress Notes (Addendum)
Progress Note  Patient Name: Anthony Andrews Date of Encounter: 09/18/2019  Primary Cardiologist: Quay Burow, MD   Subjective   Patient asymptomatic specifically denying chest pain or shortness of breath.  Inpatient Medications    Scheduled Meds: . apixaban  5 mg Oral BID  . guaiFENesin  600 mg Oral BID  . mouth rinse  15 mL Mouth Rinse BID  . metoprolol tartrate  12.5 mg Oral BID  . multivitamin with minerals  1 tablet Oral Daily  . nicotine  7 mg Transdermal Daily  . PARoxetine  20 mg Oral QPM  . rosuvastatin  10 mg Oral QHS  . senna-docusate  1 tablet Oral BID   Continuous Infusions:  PRN Meds: acetaminophen **OR** acetaminophen, antiseptic oral rinse   Vital Signs    Vitals:   09/17/19 1457 09/17/19 2002 09/18/19 0234 09/18/19 0430  BP: 122/66 (!) 135/91 132/71 134/74  Pulse: 80 86 75 74  Resp:   13   Temp: 97.7 F (36.5 C) 98.3 F (36.8 C)  (!) 97.3 F (36.3 C)  TempSrc: Oral Oral    SpO2:  96% 93% 93%  Weight:      Height:        Intake/Output Summary (Last 24 hours) at 09/18/2019 0736 Last data filed at 09/18/2019 0400 Gross per 24 hour  Intake 360 ml  Output 1050 ml  Net -690 ml   Last 3 Weights 09/11/2019  Weight (lbs) 120 lb  Weight (kg) 54.432 kg      Telemetry    Sinus rhythm, sinus bradycardia with Wenckebach- Personally Reviewed  ECG    Not performed today- Personally Reviewed  Physical Exam   GEN: No acute distress.   Neck: No JVD Cardiac: RRR, no murmurs, rubs, or gallops.  Respiratory: Clear to auscultation bilaterally. GI: Soft, nontender, non-distended  MS: No edema; No deformity. Neuro:  Nonfocal  Psych: Normal affect   Labs    High Sensitivity Troponin:  No results for input(s): TROPONINIHS in the last 720 hours.    Chemistry Recent Labs  Lab 09/13/19 0018 09/14/19 0537 09/15/19 0411 09/16/19 0417 09/17/19 0241 09/18/19 0353  NA 130* 130* 133* 132* 129* 132*  K 3.7 3.9 4.0 4.5 4.0 4.3  CL 103 104 106  103 102 103  CO2 17* 18* 19* 22 20* 21*  GLUCOSE 87 97 91 97 89 89  BUN 24* 22 23 27* 27* 29*  CREATININE 1.37* 1.20 1.29* 1.27* 1.20 1.34*  CALCIUM 9.5 9.0 9.5 9.9 9.7 10.1  PROT 5.7* 4.9* 4.9*  --   --   --   ALBUMIN 2.4* 1.9* 1.9*  --   --   --   AST 96* 47* 32  --   --   --   ALT 48* 34 29  --   --   --   ALKPHOS 58 47 52  --   --   --   BILITOT 0.4 0.6 0.7  --   --   --   GFRNONAA 52* >60 55* 56* >60 53*  GFRAA 60* >60 >60 >60 >60 >60  ANIONGAP 10 8 8 7 7 8      Hematology Recent Labs  Lab 09/14/19 0537 09/16/19 0417 09/17/19 0241  WBC 9.5 8.8 8.2  RBC 2.85* 2.68* 2.69*  HGB 9.2* 8.5* 8.6*  HCT 27.2* 25.5* 24.9*  MCV 95.4 95.1 92.6  MCH 32.3 31.7 32.0  MCHC 33.8 33.3 34.5  RDW 12.5 12.5 12.5  PLT 435* 447* 451*  BNPNo results for input(s): BNP, PROBNP in the last 168 hours.   DDimer No results for input(s): DDIMER in the last 168 hours.   Radiology    CT ANGIO CHEST PE W OR WO CONTRAST  Result Date: 09/16/2019 CLINICAL DATA:  Hypoxemia EXAM: CT ANGIOGRAPHY CHEST WITH CONTRAST TECHNIQUE: Multidetector CT imaging of the chest was performed using the standard protocol during bolus administration of intravenous contrast. Multiplanar CT image reconstructions and MIPs were obtained to evaluate the vascular anatomy. CONTRAST:  161mL OMNIPAQUE IOHEXOL 350 MG/ML SOLN COMPARISON:  None. FINDINGS: Cardiovascular: There is a optimal opacification of the pulmonary arteries. There is no central,segmental, or subsegmental filling defects within the pulmonary arteries. There is mild cardiomegaly. No evidence of right ventricular heart strain. Coronary artery and aortic valve calcifications are seen. Scattered aortic atherosclerosis is noted. There is a tortuous intrathoracic descending aorta is seen. There is normal three-vessel brachiocephalic anatomy without proximal stenosis. Mediastinum/Nodes: There is scattered subcarinal and right hilar lymph nodes present the largest in the  right hilum measures 1.2 cm and within the prevascular space 1.2 cm. Thyroid gland, trachea, and esophagus demonstrate no significant findings. Lungs/Pleura: There is extensive centrilobular emphysematous changes seen within both lung apices. There is ground-glass opacities seen throughout the mid to lower lungs with patchy airspace opacity seen dependently in the posterior lower lungs with small air bronchograms in the posterior right lung. There are small bilateral pleural effusions present. Upper Abdomen: No acute abnormalities present in the visualized portions of the upper abdomen. A partially visualized low-density lesion seen in the pole the left kidney. Musculoskeletal: No chest wall abnormality. No acute or significant osseous findings. Review of the MIP images confirms the above findings. IMPRESSION: 1. No central, segmental, subsegmental pulmonary embolism. 2. Ground-glass opacity with patchy posterior airspace consolidation which could be due to asymmetric edema, or multifocal infectious etiology. 3. Small bilateral pleural effusions. Aortic Atherosclerosis (ICD10-I70.0) and Emphysema (ICD10-J43.9). Electronically Signed   By: Prudencio Pair M.D.   On: 09/16/2019 19:29   DG Swallowing Func-Speech Pathology  Result Date: 09/16/2019 Objective Swallowing Evaluation: Type of Study: MBS-Modified Barium Swallow Study  Patient Details Name: Anthony Andrews MRN: [id number] Date of Birth: 06/09/1946 Today's Date: 09/16/2019 Time: SLP Start Time (ACUTE ONLY): 1058 -SLP Stop Time (ACUTE ONLY): 1112 SLP Time Calculation (min) (ACUTE ONLY): 14 min Past Medical History: Past Medical History: Diagnosis Date . Acute metabolic encephalopathy  . AKI (acute kidney injury) ([place])  . Chronic hyponatremia  . Rhabdomyolysis  . Stroke ([place])  . Tobacco use  Past Surgical History: Past Surgical History: Procedure Laterality Date . BACK SURGERY   . SHOULDER SURGERY   HPI: 73yo male admitted 09/11/2019 due to a fall where he hit his  head but did not lose consciousness. He was on the floor for approx 18 hours. PMH: R CVA with residual LLE weakness  Subjective: Pt awake, alert, pleasant, participative Assessment / Plan / Recommendation CHL IP CLINICAL IMPRESSIONS 09/16/2019 Clinical Impression Pt presents with mild oropharyngeal dysphagia c/b reduced lingual strength and coordination, premature spillage, delayed swallow initiation, incomplete laryngeal closure, and reduced laryngeal elevation and excursion.  These deficits resulted in audible aspiration of thin liquid during the swallow.  Cough reflex was effecitve in clearing aspiration.  Single cup sips, prevented aspiration and  intermittently resulted in trace amount of transient penetration only.  There was aspiration with straw, and with serial cup sips. There was no penetration of serial cup sips of nectar thick liquid.  With pill  simulation there was a gross amount of aspiration of thin liquid washed used with tablet.  Pt was unable to orally transit tablet with thin liquid or puree, and pill was expectorated on both trials.  Pills to be given with puree, whole if tolerated, and crushed if necessary. There was no penetration or aspiration noted with puree or solid textures.  Pt is agreeable to continuing current diet with use of strict aspiration precautions as listed below. Recommend regular texture diet with thin liquid by SINGLE CUP SIPS only.  If pt is unable to execute compensatory strategies, or exhibits continued difficult with thin liquid, please downgrade to nectar thick liquids. SLP Visit Diagnosis Dysphagia, oropharyngeal phase (R13.12) Attention and concentration deficit following -- Frontal lobe and executive function deficit following -- Impact on safety and function Mild aspiration risk   CHL IP TREATMENT RECOMMENDATION 09/16/2019 Treatment Recommendations Therapy as outlined in treatment plan below   Prognosis 09/16/2019 Prognosis for Safe Diet Advancement Fair Barriers to  Reach Goals Time post onset Barriers/Prognosis Comment -- CHL IP DIET RECOMMENDATION 09/16/2019 SLP Diet Recommendations Regular solids;Thin liquid Liquid Administration via Cup;No straw Medication Administration Whole meds with puree Compensations Slow rate;Small sips/bites Postural Changes Seated upright at 90 degrees   CHL IP OTHER RECOMMENDATIONS 09/16/2019 Recommended Consults -- Oral Care Recommendations Oral care BID Other Recommendations --   CHL IP FOLLOW UP RECOMMENDATIONS 09/16/2019 Follow up Recommendations (No Data)   No flowsheet data found.     CHL IP ORAL PHASE 09/16/2019 Oral Phase Impaired Oral - Pudding Teaspoon -- Oral - Pudding Cup -- Oral - Honey Teaspoon -- Oral - Honey Cup -- Oral - Nectar Teaspoon -- Oral - Nectar Cup Lingual pumping;Decreased bolus cohesion;Premature spillage Oral - Nectar Straw -- Oral - Thin Teaspoon -- Oral - Thin Cup Lingual pumping;Decreased bolus cohesion;Premature spillage Oral - Thin Straw Decreased bolus cohesion;Premature spillage Oral - Puree Premature spillage Oral - Mech Soft -- Oral - Regular Piecemeal swallowing;Premature spillage Oral - Multi-Consistency -- Oral - Pill Weak lingual manipulation;Lingual pumping;Reduced posterior propulsion;Holding of bolus;Lingual/palatal residue;Delayed oral transit;Decreased bolus cohesion;Premature spillage Oral Phase - Comment --  CHL IP PHARYNGEAL PHASE 09/16/2019 Pharyngeal Phase Impaired Pharyngeal- Pudding Teaspoon -- Pharyngeal -- Pharyngeal- Pudding Cup -- Pharyngeal -- Pharyngeal- Honey Teaspoon -- Pharyngeal -- Pharyngeal- Honey Cup -- Pharyngeal -- Pharyngeal- Nectar Teaspoon -- Pharyngeal -- Pharyngeal- Nectar Cup Delayed swallow initiation-vallecula;Delayed swallow initiation-pyriform sinuses Pharyngeal Material does not enter airway Pharyngeal- Nectar Straw -- Pharyngeal -- Pharyngeal- Thin Teaspoon -- Pharyngeal -- Pharyngeal- Thin Cup Delayed swallow initiation-pyriform sinuses;Delayed swallow  initiation-vallecula;Reduced airway/laryngeal closure;Reduced laryngeal elevation;Penetration/Aspiration during swallow;Trace aspiration Pharyngeal Material does not enter airway;Material enters airway, remains ABOVE vocal cords then ejected out Pharyngeal- Thin Straw Delayed swallow initiation-vallecula;Reduced laryngeal elevation;Reduced airway/laryngeal closure;Penetration/Aspiration before swallow;Penetration/Aspiration during swallow;Moderate aspiration Pharyngeal Material enters airway, passes BELOW cords then ejected out Pharyngeal- Puree Delayed swallow initiation-vallecula Pharyngeal Material does not enter airway Pharyngeal- Mechanical Soft -- Pharyngeal -- Pharyngeal- Regular Delayed swallow initiation-vallecula Pharyngeal Material does not enter airway Pharyngeal- Multi-consistency -- Pharyngeal -- Pharyngeal- Pill Delayed swallow initiation-pyriform sinuses;Reduced epiglottic inversion;Reduced anterior laryngeal mobility;Reduced laryngeal elevation;Reduced airway/laryngeal closure;Penetration/Aspiration before swallow;Penetration/Aspiration during swallow;Penetration/Apiration after swallow;Significant aspiration (Amount) Pharyngeal Material enters airway, passes BELOW cords and not ejected out despite cough attempt by patient;Material enters airway, passes BELOW cords then ejected out Pharyngeal Comment --  CHL IP CERVICAL ESOPHAGEAL PHASE 09/16/2019 Cervical Esophageal Phase WFL Pudding Teaspoon -- Pudding Cup -- Honey Teaspoon -- Honey Cup -- Nectar Teaspoon -- Nectar Cup -- [employer]  Straw -- Thin Teaspoon -- Thin Cup -- Thin Straw -- Puree -- Mechanical Soft -- Regular -- Multi-consistency -- Pill -- Cervical Esophageal Comment -- Celedonio Savage, MA, CCC-SLP Acute Rehabilitation Services Office: [phone] 09/16/2019, 11:53 AM              ECHOCARDIOGRAM COMPLETE  Result Date: 09/17/2019   ECHOCARDIOGRAM REPORT   Patient Name:   BRET KLOCKO Date of Exam: 09/17/2019 Medical Rec #:  [id number]    Height:       63.0 in Accession #:    [id number]  Weight:       120.0 lb Date of Birth:  1945-11-09  BSA:          1.56 m Patient Age:    53 years    BP:           133/78 mmHg Patient Gender: M           HR:           83 bpm. Exam Location:  Inpatient Procedure: 2D Echo, Cardiac Doppler and Color Doppler Indications:    Atrial fibrillation  History:        Patient has no prior history of Echocardiogram examinations.                 Stroke and COPD, Signs/Symptoms:Syncope; Risk Factors:Current                 Smoker and Hypertension.  Sonographer:    Dustin Flock Referring Phys: Mand[place]  1. Left ventricular ejection fraction, by visual estimation, is 45 to 50%. The left ventricle has mildly decreased function. There is no left ventricular hypertrophy.  2. Severe hypokinesis of the left ventricular, basal-mid inferior wall.  3. Elevated left atrial pressure.  4. Left ventricular diastolic parameters are consistent with Grade II diastolic dysfunction (pseudonormalization).  5. Global right ventricle has normal systolic function.The right ventricular size is not well visualized. Right vetricular wall thickness was not assessed.  6. Left atrial size was normal.  7. Right atrial size was normal.  8. Trivial pericardial effusion is present.  9. The mitral valve is normal in structure. Trivial mitral valve regurgitation. 10. The tricuspid valve is normal in structure. 11. The aortic valve is normal in structure. Aortic valve regurgitation is not visualized. Mild to moderate aortic valve sclerosis/calcification without any evidence of aortic stenosis. 12. The pulmonic valve was not well visualized. Pulmonic valve regurgitation is not visualized. 13. Normal pulmonary artery systolic pressure. 14. The inferior vena cava is normal in size with greater than 50% respiratory variability, suggesting right atrial pressure of 3 mmHg. FINDINGS  Left Ventricle: Left ventricular ejection fraction, by visual  estimation, is 45 to 50%. The left ventricle has mildly decreased function. Severe hypokinesis of the left ventricular, basal-mid inferior wall. The left ventricle demonstrates regional wall motion abnormalities. The left ventricular internal cavity size was the left ventricle is normal in size. There is no left ventricular hypertrophy. Left ventricular diastolic parameters are consistent with Grade II diastolic dysfunction (pseudonormalization). Elevated left atrial pressure. Right Ventricle: The right ventricular size is not well visualized. Right vetricular wall thickness was not assessed. Global RV systolic function is has normal systolic function. The tricuspid regurgitant velocity is 2.42 m/s, and with an assumed right atrial pressure of 3 mmHg, the estimated right ventricular systolic pressure is normal at 26.4 mmHg. Left Atrium: Left atrial size was normal in size. Right Atrium: Right atrial size was normal  in size Pericardium: Trivial pericardial effusion is present. There is pleural effusion in the left lateral region. Mitral Valve: The mitral valve is normal in structure. Trivial mitral valve regurgitation. Tricuspid Valve: The tricuspid valve is normal in structure. Tricuspid valve regurgitation is trivial. Aortic Valve: The aortic valve is normal in structure. Aortic valve regurgitation is not visualized. Mild to moderate aortic valve sclerosis/calcification is present, without any evidence of aortic stenosis. Pulmonic Valve: The pulmonic valve was not well visualized. Pulmonic valve regurgitation is not visualized. Pulmonic regurgitation is not visualized. Aorta: The aortic root is normal in size and structure. Venous: The inferior vena cava is normal in size with greater than 50% respiratory variability, suggesting right atrial pressure of 3 mmHg. IAS/Shunts: No atrial level shunt detected by color flow Doppler.  LEFT VENTRICLE PLAX 2D LVIDd:         4.27 cm  Diastology LVIDs:         3.61 cm  LV e'  lateral:   7.51 cm/s LV PW:         0.90 cm  LV E/e' lateral: 9.8 LV IVS:        0.88 cm  LV e' medial:    5.87 cm/s LVOT diam:     2.00 cm  LV E/e' medial:  12.5 LV SV:         27 ml LV SV Index:   17.29 LVOT Area:     3.14 cm  RIGHT VENTRICLE RV Basal diam:  2.26 cm RV S prime:     10.20 cm/s TAPSE (M-mode): 1.8 cm LEFT ATRIUM             Index       RIGHT ATRIUM           Index LA diam:        3.30 cm 2.12 cm/m  RA Area:     11.80 cm LA Vol (A2C):   38.0 ml 24.42 ml/m RA Volume:   29.80 ml  19.15 ml/m LA Vol (A4C):   22.0 ml 14.14 ml/m LA Biplane Vol: 30.1 ml 19.34 ml/m  AORTIC VALVE LVOT Vmax:   94.60 cm/s LVOT Vmean:  72.300 cm/s LVOT VTI:    0.196 m  AORTA Ao Root diam: 2.90 cm MV E velocity: 73.27 cm/s 103 cm/s  TRICUSPID VALVE MV A velocity: 50.56 cm/s 70.3 cm/s TR Peak grad:   23.4 mmHg MV E/A ratio:  1.45       1.5       TR Vmax:        255.00 cm/s                                      SHUNTS                                     Systemic VTI:  0.20 m                                     Systemic Diam: 2.00 cm  Dani Gobble Croitoru MD Electronically signed by Sanda Klein MD Signature Date/Time: 09/17/2019/11:28:47 AM    Final    VAS US CAROTID  Result Date: 09/17/2019 Carotid Arterial Duplex Study Indications:  Bilateral bruits and Weakness.  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 Performing Technologist: Oda Cogan RDMS, RVT  Examination Guidelines: A complete evaluation includes B-mode imaging, spectral Doppler, color Doppler, and power Doppler as needed of all accessible portions of each vessel. Bilateral testing is considered an integral part of a complete examination. Limited examinations for reoccurring indications may be performed as noted.  Right Carotid Findings: +----------+--------+--------+--------+------------------+------------------+           PSV cm/sEDV cm/sStenosisPlaque DescriptionComments           +----------+--------+--------+--------+------------------+------------------+ CCA Prox   94      22                                                   +----------+--------+--------+--------+------------------+------------------+ CCA Distal48      17                                intimal thickening +----------+--------+--------+--------+------------------+------------------+ ICA Prox  122     45      40-59%  heterogenous                         +----------+--------+--------+--------+------------------+------------------+ ICA Mid   115     51      40-59%  heterogenous                         +----------+--------+--------+--------+------------------+------------------+ ICA Distal89      27                                                   +----------+--------+--------+--------+------------------+------------------+ ECA       458     58      >50%    heterogenous                         +----------+--------+--------+--------+------------------+------------------+ +----------+--------+-------+----------------+-------------------+           PSV cm/sEDV cmsDescribe        Arm Pressure (mmHG) +----------+--------+-------+----------------+-------------------+ [id number]            Multiphasic, WNL                    +----------+--------+-------+----------------+-------------------+ +---------+--------+---+--------+--+---------+ VertebralPSV cm/s111EDV cm/s38Antegrade +---------+--------+---+--------+--+---------+  Left Carotid Findings: +----------+--------+--------+--------+------------------+------------------+           PSV cm/sEDV cm/sStenosisPlaque DescriptionComments           +----------+--------+--------+--------+------------------+------------------+ CCA Prox  83      13                                                   +----------+--------+--------+--------+------------------+------------------+ CCA Distal47      10                                intimal thickening  +----------+--------+--------+--------+------------------+------------------+ ICA Prox  222     62      60-79%  heterogenous  close to 60%       +----------+--------+--------+--------+------------------+------------------+ ICA Mid   132     33                                                   +----------+--------+--------+--------+------------------+------------------+ ICA Distal73      23                                                   +----------+--------+--------+--------+------------------+------------------+ ECA       202     28                                                   +----------+--------+--------+--------+------------------+------------------+ +----------+--------+--------+----------------+-------------------+           PSV cm/sEDV cm/sDescribe        Arm Pressure (mmHG) +----------+--------+--------+----------------+-------------------+ [id number]             Multiphasic, WNL                    +----------+--------+--------+----------------+-------------------+ +---------+--------+--+--------+-+---------+ VertebralPSV cm/s39EDV cm/s6Antegrade +---------+--------+--+--------+-+---------+  Summary: Right Carotid: Velocities in the right ICA are consistent 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 The ECA appears >50% stenosed. Left Carotid: Velocities in the left ICA are consistent with a 60-79% stenosis. Vertebrals: Bilateral vertebral arteries demonstrate antegrade flow. *See table(s) above for measurements and observations.  Electronically signed by Monica Martinez MD on 09/17/2019 at 5:20:01 PM.    Final     Cardiac Studies   2D echocardiogram (09/17/2019)  IMPRESSIONS    1. Left ventricular ejection fraction, by visual estimation, is 45 to 50%. The left ventricle has mildly decreased function. There is no left ventricular hypertrophy.  2. Severe hypokinesis of the left ventricular, basal-mid inferior wall.  3. Elevated left  atrial pressure.  4. Left ventricular diastolic parameters are consistent with Grade II diastolic dysfunction (pseudonormalization).  5. Global right ventricle has normal systolic function.The right ventricular size is not well visualized. Right vetricular wall thickness was not assessed.  6. Left atrial size was normal.  7. Right atrial size was normal.  8. Trivial pericardial effusion is present.  9. The mitral valve is normal in structure. Trivial mitral valve regurgitation. 10. The tricuspid valve is normal in structure. 11. The aortic valve is normal in structure. Aortic valve regurgitation is not visualized. Mild to moderate aortic valve sclerosis/calcification without any evidence of aortic stenosis. 12. The pulmonic valve was not well visualized. Pulmonic valve regurgitation is not visualized. 13. Normal pulmonary artery systolic pressure. 14. The inferior vena cava is normal in size with greater than 50% respiratory variability, suggesting right atrial pressure of 3 mmHg.   Carotid Doppler studies (09/17/2019)  Summary: Right Carotid: Velocities in the right ICA are consistent 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 The ECA appears >50% stenosed.  Left Carotid: Velocities in the left ICA are consistent with a 60-79% stenosis.  Vertebrals:  Bilateral vertebral arteries demonstrate antegrade flow.  Patient Profile     73 y.o. male admitted with rhabdomyolysis.  He fell in his house because of a "mechanical fall".  His wife was not present at the time.  He was down 18 hours before he was found by his son.  He had high CPK and moderate renal insufficiency.  There is a history of prior CVA several years ago.  He is found to have PAF on his monitor and was therefore begun on Eliquis oral anticoagulation.  Assessment & Plan    1: Paroxysmal atrial fibrillation-on Eliquis for stroke prophylaxis.  His hemoglobin is 8.6 will which will need to be closely monitored.  2: Left  ventricular dysfunction-2D echo revealed mild LV dysfunction with EF in the 45 to 50% range.  Patient has no symptoms of heart failure  3: Carotid artery disease-carotid Dopplers performed because of auscultated bruits revealed no evidence of right ICA stenosis with moderate left ICA stenosis.  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  Is difficult to know whether this is type I or type II.  He is completely unaware of this.  I do not think this is contributory to his fall which sounds mechanical.  He does have PAF has been maintaining sinus rhythm.  He is on low-dose beta-blocker.  We will arrange outpatient event monitoring to further evaluate   [facility] will sign off.   Medication Recommendations: Eliquis as you are doing Other recommendations (labs, testing, etc): No other changes Follow up as an outpatient: We will arrange outpatient follow-up  For questions or updates, please contact [place] Please consult [email or web address] for contact info under        Signed, Quay Burow, MD  09/18/2019, 7:36 AM

## 2019-09-19 LAB — RETICULOCYTES
Immature Retic Fract: 17.8 % — ABNORMAL HIGH (ref 2.3–15.9)
RBC.: 2.98 MIL/uL — ABNORMAL LOW (ref 4.22–5.81)
Retic Count, Absolute: 88.5 10*3/uL (ref 19.0–186.0)
Retic Ct Pct: 3 % (ref 0.4–3.1)

## 2019-09-19 LAB — FOLATE: Folate: 15.2 ng/mL (ref >5.9)

## 2019-09-19 LAB — BASIC METABOLIC PANEL
Anion gap: 8 (ref 5–15)
BUN: 25 mg/dL — ABNORMAL HIGH (ref 8–23)
CO2: 21 mmol/L — ABNORMAL LOW (ref 22–32)
Calcium: 9.6 mg/dL (ref 8.9–10.3)
Chloride: 103 mmol/L (ref 98–111)
Creatinine, Ser: 1.22 mg/dL (ref 0.61–1.24)
GFR calc Af Amer: >60 mL/min (ref >60)
GFR calc non Af Amer: 59 mL/min — ABNORMAL LOW (ref >60)
Glucose, Bld: 89 mg/dL (ref 70–99)
Potassium: 3.9 mmol/L (ref 3.5–5.1)
Sodium: 132 mmol/L — ABNORMAL LOW (ref 135–145)

## 2019-09-19 LAB — SARS CORONAVIRUS 2 (TAT 6-24 HRS): SARS Coronavirus 2: NEGATIVE

## 2019-09-19 LAB — IRON AND TIBC
Iron: 20 ug/dL — ABNORMAL LOW (ref 45–182)
Saturation Ratios: 12 % — ABNORMAL LOW (ref 17.9–39.5)
TIBC: 174 ug/dL — ABNORMAL LOW (ref 250–450)
UIBC: 154 ug/dL

## 2019-09-19 MED ORDER — METOPROLOL TARTRATE 25 MG PO TABS
12.5000 mg | ORAL_TABLET | Freq: Every day | ORAL | Status: DC
  Administered 2019-09-20 – 2019-09-21 (×2): 12.5 mg via ORAL
  Filled 2019-09-19 (×2): qty 1

## 2019-09-19 NOTE — Plan of Care (Signed)

## 2019-09-19 NOTE — Progress Notes (Signed)
Notified patient is having episodes of bradycardia... in the 30's. Dr. Erlinda Hong notified and is calling cardiology, VO to hold Lopressor until furthe rnotice.  Awaiting further orders.  Patient is asymptomatic and back in the 70's.

## 2019-09-19 NOTE — Progress Notes (Addendum)
PROGRESS NOTE  Anthony Andrews [id number] DOB: 11-28-1945 DOA: 09/11/2019 PCP: Patient, No Pcp Per  Brief summary:  Anthony Andrews is a 73 y.o. 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 Patient states he tripped over a door frame and fell on the ground.  He hit his head but did not lose consciousness.  He was down on the ground for approximately 18 hours.  He was found by his son who called EMS.   He is found to have AKI, rhabdomyolysis, acute metabolic encephalopathy, and hypoxia New diagnosis of atrial tachycardia versus A. Fib, cardiology consult requested  will need to go to skilled nursing facility, repeat Covid screening ordered  HPI/Recap of past 24 hours:  Continue to have intermittent  bradycardia on tele , but he does not appear to have any symptom .  He denies of chest pain no short of breath no dizziness .  Blood Pressure is stable .   Assessment/Plan: Principal Problem:   Rhabdomyolysis Active Problems:   AKI (acute kidney injury) ([place])   Acute metabolic encephalopathy   Chronic hyponatremia   Metabolic acidosis   Acute respiratory failure with hypoxia ([facility])   Atrial fibrillation ([place])   Unwitnessed fall at home -Patient states he tripped over a door frame and fell on the ground.  -Head, CT cervical spine no acute findings, UDS negative, alcohol level less than 10 -cardiology will arrange event monitor  Atrial tachycardia / A. Fib/bradycardia new diagnosis -tsh 3.2 -- cardiology consulted input appreciated, patient is started on eliquis,  -Patient continued to have bradycardia on telemetry however he is not symptomatic I have discussed this with Dr. Alvester Chou again on December 26, per Dr. Alvester Chou , can decrease Lopressor dose to 12.5 mg daily, cardiology will arrange outpatient cardiac monitor. And follow up with Dr Gwenlyn Found   Acute hypoxic respiratory failure, resolved -- history of smoking  emphysema -report choking episode, he did well with speech therapy and barium swallow study - cta chest no PE, but showed bilateral pleural effusion and groundglass opacity edema versus infection, doubt infection ,procalcitonin less than 0.1 , no leukocytosis, no fever -Echo lvef 45%  Acute on chronic hyponatremia Sodium 129 on presentation Likely multifactorial he has poor oral intake  Rhabdomyolysis/abdominal FLT Lab work normalized   AKI on CKD 2 -Likely from rhabdomyolysis, UA obtained on admission no sign of infection -Renal ultrasound no hydronephrosis, unremarkable -Improved, renal dosing meds - bladder scan to measure post void residual, start low-dose Flomax  Normocytic anemia -Hemoglobin around 8-9, occult blood testing negative, B12 /folate /TSH unremarkable, read to count mildly elevated, iron saturation reached ratio is low, however TIBC is also low -Possible underlying anemia of chronic disease -Monitor  Acute metabolic encephalopathy, with possible underlying vascular dementia, son report noticed patient had memory issues for a few month -Per H&P "patient knows his name.  He is aware that he is at a hospital.  Does not know which city he is in.  Not oriented to time.  He thinks it is 2010.  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,  -  now on eliquis due to afib diagnosis -continue Crestor  -f/u with neurology   HTN:  Home meds Norvasc and losartan held since admission He is currently on Lopressor, blood pressure stable  Cigarette smoking - smoking cessation education provided ,currently on nicotine patch  DVT Prophylaxis:on  Eliquis  Code Status: Full  Family Communication: patient   Disposition Plan: snf placement hopefully on Monday, repeat Covid screening ordered on December 26 in preparation for SNF placement   Consultants:  Cardiology  Procedures:  None  Antibiotics:  None   Objective: BP 138/87 (BP Location: Right Arm)   Pulse 74   Temp 98.2 F (36.8 C) (Oral)   Resp 16   Ht 5\' 3"  (1.6 m)   Wt 54.4 kg   SpO2 96%   BMI 21.26 kg/m   Intake/Output Summary (Last 24 hours) at 09/19/2019 [id number] Last data filed at 09/19/2019 0500 Gross per 24 hour  Intake 240 ml  Output 1640 ml  Net -1400 ml   Filed Weights   09/11/19 1818  Weight: 54.4 kg    Exam: Patient is examined daily including today on 09/19/2019, exams remain the same as of yesterday except that has changed    General: Frail, chronic ill-appearing, thin, NAD  Cardiovascular: RRR  Respiratory: Diminished at bases, no rhonchi ,no wheezing  Abdomen: Soft/ND/NT, positive BS  Musculoskeletal: No Edema  Neuro: alert, oriented x3, impaired memory  Data Reviewed: Basic Metabolic Panel: Recent Labs  Lab 09/15/19 0411 09/16/19 0417 09/17/19 0241 09/18/19 0353 09/19/19 0427  NA 133* 132* 129* 132* 132*  K 4.0 4.5 4.0 4.3 3.9  CL 106 103 102 103 103  CO2 19* 22 20* 21* 21*  GLUCOSE 91 97 89 89 89  BUN 23 27* 27* 29* 25*  CREATININE 1.29* 1.27* 1.20 1.34* 1.22  CALCIUM 9.5 9.9 9.7 10.1 9.6   Liver Function Tests: Recent Labs  Lab 09/13/19 0018 09/14/19 0537 09/15/19 0411  AST 96* 47* 32  ALT 48* 34 29  ALKPHOS 58 47 52  BILITOT 0.4 0.6 0.7  PROT 5.7* 4.9* 4.9*  ALBUMIN 2.4* 1.9* 1.9*   No results for input(s): LIPASE, AMYLASE in the last 168 hours. No results for input(s): AMMONIA in the last 168 hours. CBC: Recent Labs  Lab 09/13/19 0018 09/14/19 0537 09/16/19 0417 09/17/19 0241  WBC 9.1 9.5 8.8 8.2  NEUTROABS  --  7.1  --  5.9  HGB 10.1* 9.2* 8.5* 8.6*  HCT 29.5* 27.2* 25.5* 24.9*  MCV  93.7 95.4 95.1 92.6  PLT 457* 435* 447* 451*   Cardiac Enzymes:   Recent Labs  Lab 09/13/19 0018 09/14/19 0537 09/15/19 0411  CKTOTAL 1,002* 213 106   BNP (last 3 results) No results for input(s): BNP in the last 8760 hours.  ProBNP (last 3 results) No results for input(s): PROBNP in the last 8760 hours.  CBG: No results for input(s): GLUCAP in the last 168 hours.  Recent Results (from the past 240 hour(s))  SARS CORONAVIRUS 2 (TAT 6-24 HRS) Nasopharyngeal Nasopharyngeal Swab     Status: None   Collection Time: 09/11/19 11:18 PM   Specimen: Nasopharyngeal Swab  Result Value Ref Range Status   SARS Coronavirus 2 NEGATIVE NEGATIVE Final    Comment: (NOTE) SARS-CoV-2 target nucleic acids are NOT DETECTED. The SARS-CoV-2 RNA is generally detectable in upper and lower respiratory specimens during the acute phase of infection. Negative results do not preclude SARS-CoV-2 infection, do not rule out co-infections with other pathogens, and  should not be used as the sole basis for treatment or other patient management decisions. Negative results must be combined with clinical observations, patient history, and epidemiological information. The expected result is Negative. Fact Sheet for Patients: SugarRoll.be Fact Sheet for Healthcare Providers: https://www.woods-mathews.com/ This test is not yet approved or cleared by the [place] FDA and  has been authorized for detection and/or diagnosis of SARS-CoV-2 by FDA under an Emergency Use Authorization (EUA). This EUA will remain  in effect (meaning this test can be used) for the duration of the COVID-19 declaration under Section 56 4(b)(1) of the Act, 21 U.S.C. section 360bbb-3(b)(1), unless the authorization is terminated or revoked sooner. Performed at [facility], [place] [address], [place], [place] [address]   SARS CORONAVIRUS 2 (TAT 6-24 HRS) Nasopharyngeal Nasopharyngeal Swab      Status: None   Collection Time: 09/14/19 11:15 AM   Specimen: Nasopharyngeal Swab  Result Value Ref Range Status   SARS Coronavirus 2 NEGATIVE NEGATIVE Final    Comment: (NOTE) SARS-CoV-2 target nucleic acids are NOT DETECTED. The SARS-CoV-2 RNA is generally detectable in upper and lower respiratory specimens during the acute phase of infection. Negative results do not preclude SARS-CoV-2 infection, do not rule out co-infections with other pathogens, and should not be used as the sole basis for treatment or other patient management decisions. Negative results must be combined with clinical observations, patient history, and epidemiological information. The expected result is Negative. Fact Sheet for Patients: SugarRoll.be Fact Sheet for Healthcare Providers: https://www.woods-mathews.com/ This test is not yet approved or cleared by the [place] FDA and  has been authorized for detection and/or diagnosis of SARS-CoV-2 by FDA under an Emergency Use Authorization (EUA). This EUA will remain  in effect (meaning this test can be used) for the duration of the COVID-19 declaration under Section 56 4(b)(1) of the Act, 21 U.S.C. section 360bbb-3(b)(1), unless the authorization is terminated or revoked sooner. Performed at [facility], [place] [address], [place], [place] [address]   SARS CORONAVIRUS 2 (TAT 6-24 HRS) Nasopharyngeal Nasopharyngeal Swab     Status: None   Collection Time: 09/16/19  4:22 PM   Specimen: Nasopharyngeal Swab  Result Value Ref Range Status   SARS Coronavirus 2 NEGATIVE NEGATIVE Final    Comment: (NOTE) SARS-CoV-2 target nucleic acids are NOT DETECTED. The SARS-CoV-2 RNA is generally detectable in upper and lower respiratory specimens during the acute phase of infection. Negative results do not preclude SARS-CoV-2 infection, do not rule out co-infections with other pathogens, and should not be used as the sole  basis for treatment or other patient management decisions. Negative results must be combined with clinical observations, patient history, and epidemiological information. The expected result is Negative. Fact Sheet for Patients: SugarRoll.be Fact Sheet for Healthcare Providers: https://www.woods-mathews.com/ This test is not yet approved or cleared by the [place] FDA and  has been authorized for detection and/or diagnosis of SARS-CoV-2 by FDA under an Emergency Use Authorization (EUA). This EUA will remain  in effect (meaning this test can be used) for the duration of the COVID-19 declaration under Section 56 4(b)(1) of the Act, 21 U.S.C. section 360bbb-3(b)(1), unless the authorization is terminated or revoked sooner. Performed at [facility], [place] [address], [place], [place] [address]      Studies: US RENAL  Result Date: 09/18/2019 CLINICAL DATA:  Elevated serum creatinine levels. EXAM: RENAL / URINARY TRACT ULTRASOUND COMPLETE COMPARISON:  Chest CTA 09/16/2019. FINDINGS: Right Kidney: Renal  measurements: 10.0 x 6.3 x 4.8 cm = volume: 159.0 mL . Echogenicity within normal limits. No mass or hydronephrosis visualized. Left Kidney: Renal measurements: 10.4 x 5.7 x 3.9 cm = volume: 119.5 mL. There is a simple cyst in the upper pole measuring 3.0 x 2.6 x 2.9 cm. No renal mass or hydronephrosis. Bladder: Appears normal for degree of bladder distention. Other: None. IMPRESSION: 1. Simple cyst in the upper pole of the left kidney. 2. Both kidneys otherwise appear normal.  No hydronephrosis. Electronically Signed   By: Richardean Sale M.D.   On: 09/18/2019 13:54    Scheduled Meds: . apixaban  5 mg Oral BID  . guaiFENesin  600 mg Oral BID  . mouth rinse  15 mL Mouth Rinse BID  . [START ON 09/20/2019] metoprolol tartrate  12.5 mg Oral Daily  . multivitamin with minerals  1 tablet Oral Daily  . nicotine  7 mg Transdermal Daily  .  PARoxetine  20 mg Oral QPM  . polyethylene glycol  17 g Oral Daily  . rosuvastatin  10 mg Oral QHS  . senna-docusate  1 tablet Oral BID  . sodium bicarbonate  650 mg Oral BID  . tamsulosin  0.4 mg Oral Daily    Continuous Infusions:   Time spent: 59mins I have personally reviewed and interpreted on  09/19/2019 daily labs, tele strips, imagings as discussed above under date review session and assessment and plans.  I reviewed all nursing notes, pharmacy notes, consultant notes,  vitals, pertinent old records  I have discussed plan of care as described above with RN , patient  on 09/19/2019   Florencia Reasons MD, PhD, FACP  [facility]  [email or web address], password [facility] 09/19/2019, 8:42 AM  LOS: 6 days

## 2019-09-19 NOTE — Plan of Care (Signed)
  Problem: Pain Managment: Goal: General experience of comfort will improve Outcome: Progressing   Problem: Safety: Goal: Ability to remain free from injury will improve Outcome: Progressing   Problem: Skin Integrity: Goal: Risk for impaired skin integrity will decrease Outcome: Progressing   

## 2019-09-20 LAB — CBC WITH DIFFERENTIAL/PLATELET
Abs Immature Granulocytes: 0.07 10*3/uL (ref 0.00–0.07)
Basophils Absolute: 0.1 10*3/uL (ref 0.0–0.1)
Basophils Relative: 1 %
Eosinophils Absolute: 0.3 10*3/uL (ref 0.0–0.5)
Eosinophils Relative: 4 %
HCT: 26.5 % — ABNORMAL LOW (ref 39.0–52.0)
Hemoglobin: 9.1 g/dL — ABNORMAL LOW (ref 13.0–17.0)
Immature Granulocytes: 1 %
Lymphocytes Relative: 15 %
Lymphs Abs: 1.3 10*3/uL (ref 0.7–4.0)
MCH: 31.8 pg (ref 26.0–34.0)
MCHC: 34.3 g/dL (ref 30.0–36.0)
MCV: 92.7 fL (ref 80.0–100.0)
Monocytes Absolute: 0.8 10*3/uL (ref 0.1–1.0)
Monocytes Relative: 9 %
Neutro Abs: 6.1 10*3/uL (ref 1.7–7.7)
Neutrophils Relative %: 70 %
Platelets: 491 10*3/uL — ABNORMAL HIGH (ref 150–400)
RBC: 2.86 MIL/uL — ABNORMAL LOW (ref 4.22–5.81)
RDW: 12.3 % (ref 11.5–15.5)
WBC: 8.7 10*3/uL (ref 4.0–10.5)
nRBC: 0 % (ref 0.0–0.2)

## 2019-09-20 LAB — BASIC METABOLIC PANEL
Anion gap: 6 (ref 5–15)
BUN: 26 mg/dL — ABNORMAL HIGH (ref 8–23)
CO2: 22 mmol/L (ref 22–32)
Calcium: 9.5 mg/dL (ref 8.9–10.3)
Chloride: 102 mmol/L (ref 98–111)
Creatinine, Ser: 1.3 mg/dL — ABNORMAL HIGH (ref 0.61–1.24)
GFR calc Af Amer: >60 mL/min (ref >60)
GFR calc non Af Amer: 55 mL/min — ABNORMAL LOW (ref >60)
Glucose, Bld: 90 mg/dL (ref 70–99)
Potassium: 3.8 mmol/L (ref 3.5–5.1)
Sodium: 130 mmol/L — ABNORMAL LOW (ref 135–145)

## 2019-09-20 LAB — MAGNESIUM: Magnesium: 1.9 mg/dL (ref 1.7–2.4)

## 2019-09-20 NOTE — Plan of Care (Signed)

## 2019-09-20 NOTE — Progress Notes (Signed)
PROGRESS NOTE  Anthony Andrews [id number] DOB: [birth year] DOA: 09/11/2019 PCP: Patient, No Pcp Per  Brief Summary:  Anthony Andrews is a 73 y.o. 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 Patient states he tripped over a door frame and fell on the ground.  He hit his head but did not lose consciousness.  He was down on the ground for approximately 18 hours.  He was found by his son who called EMS.   He is found to have AKI, rhabdomyolysis, acute metabolic encephalopathy, and hypoxia New diagnosis of  A. Fib/bradycardia, cardiology consult requested   will need to go to skilled nursing facility, repeat Covid screening ordered on 12/26 is negative  HPI/Recap of past 24 hours:  Continue to have intermittent  bradycardia on tele , but he does not appear to have any symptom .  He denies of chest pain no short of breath no dizziness .  Blood Pressure is stable .  He reports feeling stronger, able to sit up for lunch yesterday   Assessment/Plan: Principal Problem:   Rhabdomyolysis Active Problems:   AKI (acute kidney injury) ([facility])   Acute metabolic encephalopathy   Chronic hyponatremia   Metabolic acidosis   Acute respiratory failure with hypoxia ([facility])   Atrial fibrillation ([facility])   Unwitnessed fall at home -Patient states he tripped over a door frame and fell on the ground.  -Head, CT cervical spine no acute findings, UDS negative, alcohol level less than 10 -cardiology will arrange event monitor  Atrial tachycardia / A. Fib/bradycardia new diagnosis -tsh 3.2 -- cardiology consulted input appreciated, patient is started on eliquis,  -Patient continued to have bradycardia on telemetry, however he is not symptomatic I have discussed this with Dr. Alvester Chou again on December 26, per Dr. Alvester Chou , can decrease Lopressor dose to 12.5 mg daily, cardiology will arrange outpatient cardiac monitor.    Acute hypoxic  respiratory failure, resolved -- history of smoking emphysema -report choking episode, he did well with speech therapy and barium swallow study - cta chest no PE, but showed bilateral pleural effusion and groundglass opacity edema versus infection, doubt infection ,procalcitonin less than 0.1 , no leukocytosis, no fever -Echo lvef 45%  Acute on chronic hyponatremia Sodium 129 on presentation Likely multifactorial he has poor oral intake  Rhabdomyolysis/abdominal FLT Lab work normalized   AKI on CKD 2 -Likely from rhabdomyolysis, UA obtained on admission no sign of infection -Renal ultrasound no hydronephrosis, unremarkable -Improved, renal dosing meds - bladder scan to measure post void residual 122, he does report history of frequent urination at night for the last few month - started Flomax -Out patient follow up with urology and nephrology  Normocytic anemia -Hemoglobin around 8-9, occult blood testing negative, B12 /folate /TSH unremarkable, read to count mildly elevated, iron saturation reached ratio is low, however TIBC is also low -Possible underlying anemia of chronic disease -Monitor  Acute metabolic encephalopathy, with possible underlying vascular dementia, son report noticed patient had memory issues for a few month -Per H&P "patient knows his name.  He is aware that he is at a hospital.  Does not know which city he is in.  Not oriented to time.  He thinks it is 2010.  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,  -now on eliquis due to afib diagnosis -continue Crestor  -f/u with neurology   HTN:  Home meds Norvasc and losartan held since  admission He is currently on Lopressor 12.5 once a day due to bradycardia, blood pressure stable  Cigarette smoking - smoking cessation education provided ,currently on nicotine patch  DVT Prophylaxis:on Eliquis  Code Status: Full  Family Communication: patient   Disposition Plan: snf placement hopefully on Monday, repeat Covid screening ordered on December 26 in preparation for SNF placement   Consultants:  Cardiology  Procedures:  None  Antibiotics:  None   Objective: BP 138/82 (BP Location: Right Arm)   Pulse 89   Temp 98 F (36.7 C) (Oral)   Resp 16   Ht 5\' 3"  (1.6 m)   Wt 54.4 kg   SpO2 96%   BMI 21.26 kg/m   Intake/Output Summary (Last 24 hours) at 09/20/2019 1148 Last data filed at 09/20/2019 0800 Gross per 24 hour  Intake 720 ml  Output 1400 ml  Net -680 ml   Filed Weights   09/11/19 1818  Weight: 54.4 kg    Exam: Patient is examined daily including today on 09/20/2019, exams remain the same as of yesterday except that has changed    General: Frail, chronic ill-appearing, thin, NAD  Cardiovascular: RRR  Respiratory: Diminished at bases, no rhonchi ,no wheezing  Abdomen: Soft/ND/NT, positive BS  Musculoskeletal: No Edema  Neuro: alert, oriented x3, impaired memory  Data Reviewed: Basic Metabolic Panel: Recent Labs  Lab 09/16/19 0417 09/17/19 0241 09/18/19 0353 09/19/19 0427 09/20/19 0453  NA 132* 129* 132* 132* 130*  K 4.5 4.0 4.3 3.9 3.8  CL 103 102 103 103 102  CO2 22 20* 21* 21* 22  GLUCOSE 97 89 89 89 90  BUN 27* 27* 29* 25* 26*  CREATININE 1.27* 1.20 1.34* 1.22 1.30*  CALCIUM 9.9 9.7 10.1 9.6 9.5  MG  --   --   --   --  1.9   Liver Function Tests: Recent Labs  Lab 09/14/19 0537 09/15/19 0411  AST 47* 32  ALT 34 29  ALKPHOS 47 52  BILITOT 0.6 0.7  PROT 4.9* 4.9*  ALBUMIN 1.9* 1.9*   No results for input(s): LIPASE, AMYLASE in the last 168 hours. No results for input(s): AMMONIA in the last 168  hours. CBC: Recent Labs  Lab 09/14/19 0537 09/16/19 0417 09/17/19 0241 09/20/19 0453  WBC 9.5 8.8 8.2 8.7  NEUTROABS 7.1  --  5.9 6.1  HGB 9.2* 8.5* 8.6* 9.1*  HCT 27.2* 25.5* 24.9* 26.5*  MCV 95.4 95.1 92.6 92.7  PLT 435* 447* 451* 491*   Cardiac Enzymes:   Recent Labs  Lab 09/14/19 0537 09/15/19 0411  CKTOTAL 213 106   BNP (last 3 results) No results for input(s): BNP in the last 8760 hours.  ProBNP (last 3 results) No results for input(s): PROBNP in the last 8760 hours.  CBG: No results for input(s): GLUCAP in the last 168 hours.  Recent Results (from the past 240 hour(s))  SARS CORONAVIRUS 2 (TAT 6-24 HRS) Nasopharyngeal Nasopharyngeal Swab     Status: None   Collection Time: 09/11/19 11:18 PM   Specimen: Nasopharyngeal Swab  Result Value Ref Range Status   SARS Coronavirus 2 NEGATIVE NEGATIVE Final  Comment: (NOTE) SARS-CoV-2 target nucleic acids are NOT DETECTED. The SARS-CoV-2 RNA is generally detectable in upper and lower respiratory specimens during the acute phase of infection. Negative results do not preclude SARS-CoV-2 infection, do not rule out co-infections with other pathogens, and should not be used as the sole basis for treatment or other patient management decisions. Negative results must be combined with clinical observations, patient history, and epidemiological information. The expected result is Negative. Fact Sheet for Patients: SugarRoll.be Fact Sheet for Healthcare Providers: https://www.woods-mathews.com/ This test is not yet approved or cleared by the [place] FDA and  has been authorized for detection and/or diagnosis of SARS-CoV-2 by FDA under an Emergency Use Authorization (EUA). This EUA will remain  in effect (meaning this test can be used) for the duration of the COVID-19 declaration under Section 56 4(b)(1) of the Act, 21 U.S.C. section 360bbb-3(b)(1), unless the authorization is  terminated or revoked sooner. Performed at [facility], [place] [address], [place], [place] [address]   SARS CORONAVIRUS 2 (TAT 6-24 HRS) Nasopharyngeal Nasopharyngeal Swab     Status: None   Collection Time: 09/14/19 11:15 AM   Specimen: Nasopharyngeal Swab  Result Value Ref Range Status   SARS Coronavirus 2 NEGATIVE NEGATIVE Final    Comment: (NOTE) SARS-CoV-2 target nucleic acids are NOT DETECTED. The SARS-CoV-2 RNA is generally detectable in upper and lower respiratory specimens during the acute phase of infection. Negative results do not preclude SARS-CoV-2 infection, do not rule out co-infections with other pathogens, and should not be used as the sole basis for treatment or other patient management decisions. Negative results must be combined with clinical observations, patient history, and epidemiological information. The expected result is Negative. Fact Sheet for Patients: SugarRoll.be Fact Sheet for Healthcare Providers: https://www.woods-mathews.com/ This test is not yet approved or cleared by the [place] FDA and  has been authorized for detection and/or diagnosis of SARS-CoV-2 by FDA under an Emergency Use Authorization (EUA). This EUA will remain  in effect (meaning this test can be used) for the duration of the COVID-19 declaration under Section 56 4(b)(1) of the Act, 21 U.S.C. section 360bbb-3(b)(1), unless the authorization is terminated or revoked sooner. Performed at [facility], [place] [address], [place], [place] [address]   SARS CORONAVIRUS 2 (TAT 6-24 HRS) Nasopharyngeal Nasopharyngeal Swab     Status: None   Collection Time: 09/16/19  4:22 PM   Specimen: Nasopharyngeal Swab  Result Value Ref Range Status   SARS Coronavirus 2 NEGATIVE NEGATIVE Final    Comment: (NOTE) SARS-CoV-2 target nucleic acids are NOT DETECTED. The SARS-CoV-2 RNA is generally detectable in upper and lower respiratory specimens  during the acute phase of infection. Negative results do not preclude SARS-CoV-2 infection, do not rule out co-infections with other pathogens, and should not be used as the sole basis for treatment or other patient management decisions. Negative results must be combined with clinical observations, patient history, and epidemiological information. The expected result is Negative. Fact Sheet for Patients: SugarRoll.be Fact Sheet for Healthcare Providers: https://www.woods-mathews.com/ This test is not yet approved or cleared by the [place] FDA and  has been authorized for detection and/or diagnosis of SARS-CoV-2 by FDA under an Emergency Use Authorization (EUA). This EUA will remain  in effect (meaning this test can be used) for the duration of the COVID-19 declaration under Section 56 4(b)(1) of the Act, 21 U.S.C. section 360bbb-3(b)(1), unless the authorization is terminated or revoked sooner. Performed at [facility], [address] [place], [place] [address]   SARS CORONAVIRUS 2 (TAT 6-24 HRS) Nasopharyngeal Nasopharyngeal Swab     Status: None   Collection Time: 09/19/19  4:24 PM   Specimen: Nasopharyngeal Swab  Result Value Ref Range Status   SARS Coronavirus 2 NEGATIVE NEGATIVE Final    Comment: (NOTE) SARS-CoV-2 target nucleic acids are NOT DETECTED. The SARS-CoV-2 RNA is generally detectable in upper and lower respiratory specimens during the acute phase of infection. Negative results do not preclude SARS-CoV-2 infection, do not rule out co-infections with other pathogens, and should not be used as the sole basis for treatment or other patient management decisions. Negative results must be combined with clinical observations, patient history, and epidemiological information. The expected result is Negative. Fact Sheet for Patients: SugarRoll.be Fact Sheet for Healthcare  Providers: https://www.woods-mathews.com/ This test is not yet approved or cleared by the [place] FDA and  has been authorized for detection and/or diagnosis of SARS-CoV-2 by FDA under an Emergency Use Authorization (EUA). This EUA will remain  in effect (meaning this test can be used) for the duration of the COVID-19 declaration under Section 56 4(b)(1) of the Act, 21 U.S.C. section 360bbb-3(b)(1), unless the authorization is terminated or revoked sooner. Performed at [facility], [place] [address] [place], [place] [address]      Studies: No results found.  Scheduled Meds: . apixaban  5 mg Oral BID  . guaiFENesin  600 mg Oral BID  . mouth rinse  15 mL Mouth Rinse BID  . metoprolol tartrate  12.5 mg Oral Daily  . multivitamin with minerals  1 tablet Oral Daily  . nicotine  7 mg Transdermal Daily  . PARoxetine  20 mg Oral QPM  . rosuvastatin  10 mg Oral QHS  . senna-docusate  1 tablet Oral BID  . sodium bicarbonate  650 mg Oral BID  . tamsulosin  0.4 mg Oral Daily    Continuous Infusions:   Time spent: 92mins I have personally reviewed and interpreted on  09/20/2019 daily labs, tele strips, imagings as discussed above under date review session and assessment and plans.  I reviewed all nursing notes, pharmacy notes, consultant notes,  vitals, pertinent old records  I have discussed plan of care as described above with RN , patient  on 09/20/2019   Florencia Reasons MD, PhD, FACP  [facility]  [email or web address], password [facility] 09/20/2019, 11:48 AM  LOS: 7 days

## 2019-09-20 NOTE — Plan of Care (Signed)
  Problem: Pain Managment: Goal: General experience of comfort will improve Outcome: Progressing   Problem: Safety: Goal: Ability to remain free from injury will improve Outcome: Progressing   Problem: Skin Integrity: Goal: Risk for impaired skin integrity will decrease Outcome: Progressing   

## 2019-09-21 LAB — BASIC METABOLIC PANEL
Anion gap: 8 (ref 5–15)
BUN: 29 mg/dL — ABNORMAL HIGH (ref 8–23)
CO2: 22 mmol/L (ref 22–32)
Calcium: 9.5 mg/dL (ref 8.9–10.3)
Chloride: 99 mmol/L (ref 98–111)
Creatinine, Ser: 1.39 mg/dL — ABNORMAL HIGH (ref 0.61–1.24)
GFR calc Af Amer: 59 mL/min — ABNORMAL LOW (ref >60)
GFR calc non Af Amer: 51 mL/min — ABNORMAL LOW (ref >60)
Glucose, Bld: 94 mg/dL (ref 70–99)
Potassium: 3.9 mmol/L (ref 3.5–5.1)
Sodium: 129 mmol/L — ABNORMAL LOW (ref 135–145)

## 2019-09-21 MED ORDER — METOPROLOL TARTRATE 25 MG PO TABS: 12.5000 mg | ORAL_TABLET | Freq: Every day | ORAL | 0 refills | Status: DC

## 2019-09-21 MED ORDER — TAMSULOSIN HCL 0.4 MG PO CAPS: 0.4000 mg | ORAL_CAPSULE | Freq: Every day | ORAL | 0 refills | Status: AC

## 2019-09-21 MED ORDER — APIXABAN 5 MG PO TABS: 5.0000 mg | ORAL_TABLET | Freq: Two times a day (BID) | ORAL | 0 refills | Status: DC

## 2019-09-21 MED ORDER — SODIUM BICARBONATE 650 MG PO TABS: 650.0000 mg | ORAL_TABLET | Freq: Two times a day (BID) | ORAL | 0 refills | Status: AC

## 2019-09-21 NOTE — Discharge Summary (Signed)
Physician Discharge Summary  Sacario Reetz [id number] DOB: 21-Apr-1946 DOA: 09/11/2019  PCP: Patient, No Pcp Per  Admit date: 09/11/2019  Discharge date: 09/21/2019  Admitted From:Home  Disposition:  SNF  Recommendations for Outpatient Follow-up:  1. Follow up with PCP in 1-2 weeks 2. Repeat BMP in 1 week to ensure stability of sodium levels  3. Follow-up with Dr. Kennon Holter office on 09/30/2018 scheduled 4. 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 Patient states he tripped over a door frame and fell on the ground. He hit his head but did not lose consciousness. He was down on the ground for approximately 18 hours. He was found by his son who called EMS.   He is found to have AKI, rhabdomyolysis, acute metabolic encephalopathy, and hypoxia New diagnosis of  A. Fib/bradycardia, cardiology consult requested   will need to go to skilled nursing facility, repeat Covid screening ordered on 12/26 is negative.  He is stable for discharge on 12/20 at SNF.   Unwitnessed fall at home -Patient states he tripped over a door frame and fell on the ground.  -Head, CT cervical spine no acute findings, UDS negative, alcohol level less than 10 -cardiology will arrange event monitor  Atrial tachycardia / A. Fib/bradycardia new diagnosis -tsh 3.2 -- cardiology consulted input appreciated, patient is started on eliquis,  -Patient continued to have bradycardia on telemetry, however he is not symptomatic I have discussed this with Dr. Alvester Chou again on December 26, per Dr. Alvester Chou , can decrease Lopressor dose to 12.5 mg daily, cardiology will arrange outpatient cardiac monitor.    Acute hypoxic  respiratory failure, resolved -- history of smoking emphysema -report choking episode, he did well with speech therapy and barium swallow study - cta chest no PE, but showed bilateral pleural effusion and groundglass opacity edema versus infection, doubt infection ,procalcitonin less than 0.1 , no leukocytosis, no fever -Echo lvef 45%  Acute on chronic hyponatremia Sodium 129 on presentation Likely multifactorial he has poor oral intake Repeat BMP to ensure stability in 1 week-asymptomatic  Rhabdomyolysis/abdominal FLT Lab work normalized   AKI on CKD 2-resolved -Likely from rhabdomyolysis, UA obtained on admission no sign of infection -Renal ultrasound no hydronephrosis, unremarkable -Improved, renal dosing meds - bladder scan to measure post void residual 122, he does report history of frequent urination at night for the last few month - started Flomax -Out patient follow up with urology and nephrology  Normocytic anemia -Hemoglobin around 8-9, occult blood testing negative, B12 /folate /TSH unremarkable, read to count mildly elevated, iron saturation reached ratio is low, however TIBC is also low -Possible underlying anemia of chronic disease -Monitor  Acute metabolic encephalopathy, with possible underlying vascular dementia, son report noticed patient had memory issues for a few month -Per H&P "patient knows his name. He is aware that he is at a hospital. Does not know which city he is in. Not oriented to time. He thinks it is 2010. 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,  -now on eliquis due to afib diagnosis -continue Crestor  -f/u  with neurology   HTN:  Home meds Norvasc and losartan held since admission He is currently on Lopressor 12.5 once a day due to bradycardia, blood pressure stable  Cigarette smoking - smoking cessation education provided ,currently on nicotine patch  Discharge Diagnoses:  Principal Problem:   Rhabdomyolysis Active Problems:   AKI (acute kidney injury) ([place])   Acute metabolic encephalopathy   Chronic hyponatremia   Metabolic acidosis   Acute respiratory failure with hypoxia (HCC)   Atrial fibrillation [facility])    Discharge Instructions  Discharge Instructions    Diet - low sodium heart healthy   Complete by: As directed    Increase activity slowly   Complete by: As directed      Allergies as of 09/21/2019   No Known Allergies     Medication List    STOP taking these medications   amLODipine 5 MG tablet Commonly known as: NORVASC   losartan 50 MG tablet Commonly known as: COZAAR     TAKE these medications   albuterol 108 (90 Base) MCG/ACT inhaler Commonly known as: VENTOLIN HFA Inhale 2 puffs into the lungs every 4 (four) hours as needed for wheezing or shortness of breath.   apixaban 5 MG Tabs tablet Commonly known as: ELIQUIS Take 1 tablet (5 mg total) by mouth 2 (two) times daily.   aspirin EC 81 MG tablet Take 81 mg by mouth every morning.   cholecalciferol 25 MCG (1000 UT) tablet Commonly known as: VITAMIN D3 Take 1,000 Units by mouth daily.   ibuprofen 200 MG tablet Commonly known as: ADVIL Take 400 mg by mouth every 6 (six) hours as needed for headache (pain).   metoprolol tartrate 25 MG tablet Commonly known as: LOPRESSOR Take 0.5 tablets (12.5 mg total) by mouth daily.   PARoxetine 20 MG tablet Commonly known as: PAXIL Take 20 mg by mouth daily.   rosuvastatin 10 MG tablet Commonly known as: CRESTOR Take 10 mg by mouth at bedtime.   sodium bicarbonate 650 MG tablet Take 1 tablet (650 mg total) by mouth 2 (two) times daily.    tamsulosin 0.4 MG Caps capsule Commonly known as: FLOMAX Take 1 capsule (0.4 mg total) by mouth daily.      Follow-up Information    [facility] Follow up.   Specialty: [place] Why: rehab Contact information: [facility] Follow up.   Why: h/o cva with impaired memory Contact information: [address]     [place] [phone]       [facility], [place] Follow up on 10/01/2019.   Specialties: Cardiology, Radiology Why: 9:30 AM - Dr. Kennon Holter PA. Contact information: [address] [place] [address] [phone]        Lorretta Harp, MD Follow up in 4 week(s).   Specialties: Cardiology, Radiology Why: We will arrange at your next follow-up Contact information: [place] [address] [phone]        [facility] Follow up.   Why: We will contact you next week to arrange for placement of a heart monitor. Contact information: [place] [phone]  No Known Allergies  Consultations:  Cardiology   Procedures/Studies: DG Chest 2 View  Result Date: 09/11/2019 CLINICAL DATA:  Fall with chest injury and pain. EXAM: CHEST - 2 VIEW COMPARISON:  06/15/2011 FINDINGS: The cardiomediastinal silhouette is unremarkable. Mild chronic peribronchial thickening again noted. There is no evidence of focal airspace disease, pulmonary edema, suspicious pulmonary nodule/mass, pleural effusion, or pneumothorax. No acute bony abnormalities are identified. Surgical changes in the LEFT scapula noted. IMPRESSION: No active cardiopulmonary disease. Electronically Signed   By: Margarette Canada M.D.   On: 09/11/2019 19:45   DG Pelvis 1-2 Views  Result Date:  09/11/2019 CLINICAL DATA:  Fall, pain EXAM: PELVIS - 1-2 VIEW COMPARISON:  None. FINDINGS: Hip joints and SI joints are symmetric and unremarkable. No acute bony abnormality. Specifically, no fracture, subluxation, or dislocation. Postoperative changes in the lower lumbar spine. IMPRESSION: No acute bony abnormality. Electronically Signed   By: Rolm Baptise M.D.   On: 09/11/2019 19:27   CT Head Wo Contrast  Result Date: 09/11/2019 CLINICAL DATA:  Right-sided head pain after falling and striking head. EXAM: CT HEAD WITHOUT CONTRAST TECHNIQUE: Contiguous axial images were obtained from the base of the skull through the vertex without intravenous contrast. COMPARISON:  CT head 06/15/2019. FINDINGS: Brain: There is no evidence of acute intracranial hemorrhage, mass lesion, brain edema or extra-axial fluid collection. There is stable atrophy with prominence of the ventricles and subarachnoid spaces. There are chronic small vessel ischemic changes in the periventricular white matter and basal ganglia bilaterally. There is no CT evidence of acute cortical infarction. Vascular: Intracranial vascular calcifications. No hyperdense vessel identified. Skull: Negative for fracture or focal lesion. Sinuses/Orbits: The visualized paranasal sinuses are clear. There is a chronic mastoid effusion on the right with partial opacification of right middle ear. The mastoid air cells and middle ear on the left are clear. Other: None. IMPRESSION: 1. No acute intracranial or calvarial findings. 2. Stable atrophy and chronic small vessel ischemic changes. 3. Chronic right mastoid effusion and partial right middle ear opacification. Electronically Signed   By: Richardean Sale M.D.   On: 09/11/2019 20:43   CT ANGIO CHEST PE W OR WO CONTRAST  Result Date: 09/16/2019 CLINICAL DATA:  Hypoxemia EXAM: CT ANGIOGRAPHY CHEST WITH CONTRAST TECHNIQUE: Multidetector CT imaging of the chest was performed using the standard protocol during bolus  administration of intravenous contrast. Multiplanar CT image reconstructions and MIPs were obtained to evaluate the vascular anatomy. CONTRAST:  130mL OMNIPAQUE IOHEXOL 350 MG/ML SOLN COMPARISON:  None. FINDINGS: Cardiovascular: There is a optimal opacification of the pulmonary arteries. There is no central,segmental, or subsegmental filling defects within the pulmonary arteries. There is mild cardiomegaly. No evidence of right ventricular heart strain. Coronary artery and aortic valve calcifications are seen. Scattered aortic atherosclerosis is noted. There is a tortuous intrathoracic descending aorta is seen. There is normal three-vessel brachiocephalic anatomy without proximal stenosis. Mediastinum/Nodes: There is scattered subcarinal and right hilar lymph nodes present the largest in the right hilum measures 1.2 cm and within the prevascular space 1.2 cm. Thyroid gland, trachea, and esophagus demonstrate no significant findings. Lungs/Pleura: There is extensive centrilobular emphysematous changes seen within both lung apices. There is ground-glass opacities seen throughout the mid to lower lungs with patchy airspace opacity seen dependently in the posterior lower lungs with small air bronchograms in the posterior right lung. There are small bilateral pleural effusions present. Upper Abdomen: No acute abnormalities present in the visualized portions of the upper abdomen. A partially visualized  low-density lesion seen in the pole the left kidney. Musculoskeletal: No chest wall abnormality. No acute or significant osseous findings. Review of the MIP images confirms the above findings. IMPRESSION: 1. No central, segmental, subsegmental pulmonary embolism. 2. Ground-glass opacity with patchy posterior airspace consolidation which could be due to asymmetric edema, or multifocal infectious etiology. 3. Small bilateral pleural effusions. Aortic Atherosclerosis (ICD10-I70.0) and Emphysema (ICD10-J43.9). Electronically  Signed   By: Prudencio Pair M.D.   On: 09/16/2019 19:29   CT Cervical Spine Wo Contrast  Result Date: 09/11/2019 CLINICAL DATA:  Mechanical fall in the intra way, impact to head and concrete floor. EXAM: CT CERVICAL SPINE WITHOUT CONTRAST TECHNIQUE: Multidetector CT imaging of the cervical spine was performed without intravenous contrast. Multiplanar CT image reconstructions were also generated. COMPARISON:  06/15/2019 FINDINGS: Alignment: Normal. Skull base and vertebrae: No acute fracture. No primary bone lesion or focal pathologic process. Soft tissues and spinal canal: No prevertebral fluid or swelling. No visible canal hematoma. Atherosclerotic calcifications in the bilateral carotid arteries. Disc levels: Multilevel spinal degenerative changes without change from the previous exam. Upper chest: Signs of emphysema at the lung apices as before. Some apical scarring as well. Other: None IMPRESSION: 1. Spinal degenerative changes without acute fracture of the cervical spine. 2. Pulmonary emphysema. 3. Incidental note again made of atherosclerotic calcifications, dense calcifications of the carotid bulbs bilaterally. Electronically Signed   By: Zetta Bills M.D.   On: 09/11/2019 20:45   US RENAL  Result Date: 09/18/2019 CLINICAL DATA:  Elevated serum creatinine levels. EXAM: RENAL / URINARY TRACT ULTRASOUND COMPLETE COMPARISON:  Chest CTA 09/16/2019. FINDINGS: Right Kidney: Renal measurements: 10.0 x 6.3 x 4.8 cm = volume: 159.0 mL . Echogenicity within normal limits. No mass or hydronephrosis visualized. Left Kidney: Renal measurements: 10.4 x 5.7 x 3.9 cm = volume: 119.5 mL. There is a simple cyst in the upper pole measuring 3.0 x 2.6 x 2.9 cm. No renal mass or hydronephrosis. Bladder: Appears normal for degree of bladder distention. Other: None. IMPRESSION: 1. Simple cyst in the upper pole of the left kidney. 2. Both kidneys otherwise appear normal.  No hydronephrosis. Electronically Signed   By:  Richardean Sale M.D.   On: 09/18/2019 13:54   DG CHEST PORT 1 VIEW  Result Date: 09/15/2019 CLINICAL DATA:  Short of breath, wheezing and coughing today. EXAM: PORTABLE CHEST 1 VIEW COMPARISON:  09/11/2019 FINDINGS: There is irregular interstitial thickening bilaterally, with relative sparing of the right upper lobe. There is also hazy airspace opacity associated with the interstitial thickening. Interstitial thickening has increased when compared to prior exams. No focal lung consolidation. Possible small effusions. No pneumothorax. Cardiac silhouette is normal in size. No mediastinal or hilar masses. Skeletal structures are grossly intact. IMPRESSION: 1. Increase in irregular bilateral interstitial thickening suggests interstitial edema superimposed on chronic interstitial lung disease. Alternatively, findings may be due to superimposed multifocal infection. Electronically Signed   By: Lajean Manes M.D.   On: 09/15/2019 08:35   DG Shoulder Left  Result Date: 09/11/2019 CLINICAL DATA:  Pain, fall. EXAM: LEFT SHOULDER - 2+ VIEW COMPARISON:  Chest x-ray 06/15/2019 FINDINGS: Screw Mains in place within the scapula. Glenohumeral joint is located with signs of degenerative change. No signs of acute fracture or dislocation. IMPRESSION: 1. Postoperative changes about the left shoulder, no signs of acute fracture. Electronically Signed   By: Zetta Bills M.D.   On: 09/11/2019 19:25   DG Swallowing Func-Speech Pathology  Result Date: 09/16/2019 Objective Swallowing  Evaluation: Type of Study: MBS-Modified Barium Swallow Study  Patient Details Name: Aurelius Holl MRN: [id number] Date of Birth: 1946-05-20 Today's Date: 09/16/2019 Time: SLP Start Time (ACUTE ONLY): 1058 -SLP Stop Time (ACUTE ONLY): 1112 SLP Time Calculation (min) (ACUTE ONLY): 14 min Past Medical History: Past Medical History: Diagnosis Date . Acute metabolic encephalopathy  . AKI (acute kidney injury) ([place])  . Chronic hyponatremia  .  Rhabdomyolysis  . Stroke ([place])  . Tobacco use  Past Surgical History: Past Surgical History: Procedure Laterality Date . BACK SURGERY   . SHOULDER SURGERY   HPI: 73yo male admitted 09/11/2019 due to a fall where he hit his head but did not lose consciousness. He was on the floor for approx 18 hours. PMH: R CVA with residual LLE weakness  Subjective: Pt awake, alert, pleasant, participative Assessment / Plan / Recommendation CHL IP CLINICAL IMPRESSIONS 09/16/2019 Clinical Impression Pt presents with mild oropharyngeal dysphagia c/b reduced lingual strength and coordination, premature spillage, delayed swallow initiation, incomplete laryngeal closure, and reduced laryngeal elevation and excursion.  These deficits resulted in audible aspiration of thin liquid during the swallow.  Cough reflex was effecitve in clearing aspiration.  Single cup sips, prevented aspiration and  intermittently resulted in trace amount of transient penetration only.  There was aspiration with straw, and with serial cup sips. There was no penetration of serial cup sips of nectar thick liquid.  With pill simulation there was a gross amount of aspiration of thin liquid washed used with tablet.  Pt was unable to orally transit tablet with thin liquid or puree, and pill was expectorated on both trials.  Pills to be given with puree, whole if tolerated, and crushed if necessary. There was no penetration or aspiration noted with puree or solid textures.  Pt is agreeable to continuing current diet with use of strict aspiration precautions as listed below. Recommend regular texture diet with thin liquid by SINGLE CUP SIPS only.  If pt is unable to execute compensatory strategies, or exhibits continued difficult with thin liquid, please downgrade to nectar thick liquids. SLP Visit Diagnosis Dysphagia, oropharyngeal phase (R13.12) Attention and concentration deficit following -- Frontal lobe and executive function deficit following -- Impact on safety  and function Mild aspiration risk   CHL IP TREATMENT RECOMMENDATION 09/16/2019 Treatment Recommendations Therapy as outlined in treatment plan below   Prognosis 09/16/2019 Prognosis for Safe Diet Advancement Fair Barriers to Reach Goals Time post onset Barriers/Prognosis Comment -- CHL IP DIET RECOMMENDATION 09/16/2019 SLP Diet Recommendations Regular solids;Thin liquid Liquid Administration via Cup;No straw Medication Administration Whole meds with puree Compensations Slow rate;Small sips/bites Postural Changes Seated upright at 90 degrees   CHL IP OTHER RECOMMENDATIONS 09/16/2019 Recommended Consults -- Oral Care Recommendations Oral care BID Other Recommendations --   CHL IP FOLLOW UP RECOMMENDATIONS 09/16/2019 Follow up Recommendations (No Data)   No flowsheet data found.     CHL IP ORAL PHASE 09/16/2019 Oral Phase Impaired Oral - Pudding Teaspoon -- Oral - Pudding Cup -- Oral - Honey Teaspoon -- Oral - Honey Cup -- Oral - Nectar Teaspoon -- Oral - Nectar Cup Lingual pumping;Decreased bolus cohesion;Premature spillage Oral - Nectar Straw -- Oral - Thin Teaspoon -- Oral - Thin Cup Lingual pumping;Decreased bolus cohesion;Premature spillage Oral - Thin Straw Decreased bolus cohesion;Premature spillage Oral - Puree Premature spillage Oral - Mech Soft -- Oral - Regular Piecemeal swallowing;Premature spillage Oral - Multi-Consistency -- Oral - Pill Weak lingual manipulation;Lingual pumping;Reduced posterior propulsion;Holding of bolus;Lingual/palatal residue;Delayed oral transit;Decreased bolus  cohesion;Premature spillage Oral Phase - Comment --  CHL IP PHARYNGEAL PHASE 09/16/2019 Pharyngeal Phase Impaired Pharyngeal- Pudding Teaspoon -- Pharyngeal -- Pharyngeal- Pudding Cup -- Pharyngeal -- Pharyngeal- Honey Teaspoon -- Pharyngeal -- Pharyngeal- Honey Cup -- Pharyngeal -- Pharyngeal- Nectar Teaspoon -- Pharyngeal -- Pharyngeal- Nectar Cup Delayed swallow initiation-vallecula;Delayed swallow initiation-pyriform  sinuses Pharyngeal Material does not enter airway Pharyngeal- Nectar Straw -- Pharyngeal -- Pharyngeal- Thin Teaspoon -- Pharyngeal -- Pharyngeal- Thin Cup Delayed swallow initiation-pyriform sinuses;Delayed swallow initiation-vallecula;Reduced airway/laryngeal closure;Reduced laryngeal elevation;Penetration/Aspiration during swallow;Trace aspiration Pharyngeal Material does not enter airway;Material enters airway, remains ABOVE vocal cords then ejected out Pharyngeal- Thin Straw Delayed swallow initiation-vallecula;Reduced laryngeal elevation;Reduced airway/laryngeal closure;Penetration/Aspiration before swallow;Penetration/Aspiration during swallow;Moderate aspiration Pharyngeal Material enters airway, passes BELOW cords then ejected out Pharyngeal- Puree Delayed swallow initiation-vallecula Pharyngeal Material does not enter airway Pharyngeal- Mechanical Soft -- Pharyngeal -- Pharyngeal- Regular Delayed swallow initiation-vallecula Pharyngeal Material does not enter airway Pharyngeal- Multi-consistency -- Pharyngeal -- Pharyngeal- Pill Delayed swallow initiation-pyriform sinuses;Reduced epiglottic inversion;Reduced anterior laryngeal mobility;Reduced laryngeal elevation;Reduced airway/laryngeal closure;Penetration/Aspiration before swallow;Penetration/Aspiration during swallow;Penetration/Apiration after swallow;Significant aspiration (Amount) Pharyngeal Material enters airway, passes BELOW cords and not ejected out despite cough attempt by patient;Material enters airway, passes BELOW cords then ejected out Pharyngeal Comment --  CHL IP CERVICAL ESOPHAGEAL PHASE 09/16/2019 Cervical Esophageal Phase WFL Pudding Teaspoon -- Pudding Cup -- Honey Teaspoon -- Honey Cup -- Nectar Teaspoon -- Nectar Cup -- Nectar Straw -- Thin Teaspoon -- Thin Cup -- Thin Straw -- Puree -- Mechanical Soft -- Regular -- Multi-consistency -- Pill -- Cervical Esophageal Comment -- Celedonio Savage, MA, CCC-SLP Acute Rehabilitation Services  Office: [phone] 09/16/2019, 11:53 AM              ECHOCARDIOGRAM COMPLETE  Result Date: 09/17/2019   ECHOCARDIOGRAM REPORT   Patient Name:   DONTREAL TAUTE Date of Exam: 09/17/2019 Medical Rec #:  [id number]   Height:       63.0 in Accession #:    [id number]  Weight:       120.0 lb Date of Birth:  May 14, 1946  BSA:          1.56 m Patient Age:    6 years    BP:           133/78 mmHg Patient Gender: M           HR:           83 bpm. Exam Location:  Inpatient Procedure: 2D Echo, Cardiac Doppler and Color Doppler Indications:    Atrial fibrillation  History:        Patient has no prior history of Echocardiogram examinations.                 Stroke and COPD, Signs/Symptoms:Syncope; Risk Factors:Current                 Smoker and Hypertension.  Sonographer:    Dustin Flock Referring Phys: [place]  1. Left ventricular ejection fraction, by visual estimation, is 45 to 50%. The left ventricle has mildly decreased function. There is no left ventricular hypertrophy.  2. Severe hypokinesis of the left ventricular, basal-mid inferior wall.  3. Elevated left atrial pressure.  4. Left ventricular diastolic parameters are consistent with Grade II diastolic dysfunction (pseudonormalization).  5. Global right ventricle has normal systolic function.The right ventricular size is not well visualized. Right vetricular wall thickness was not assessed.  6. Left atrial size was normal.  7. Right atrial size was  normal.  8. Trivial pericardial effusion is present.  9. The mitral valve is normal in structure. Trivial mitral valve regurgitation. 10. The tricuspid valve is normal in structure. 11. The aortic valve is normal in structure. Aortic valve regurgitation is not visualized. Mild to moderate aortic valve sclerosis/calcification without any evidence of aortic stenosis. 12. The pulmonic valve was not well visualized. Pulmonic valve regurgitation is not visualized. 13. Normal pulmonary artery systolic  pressure. 14. The inferior vena cava is normal in size with greater than 50% respiratory variability, suggesting right atrial pressure of 3 mmHg. FINDINGS  Left Ventricle: Left ventricular ejection fraction, by visual estimation, is 45 to 50%. The left ventricle has mildly decreased function. Severe hypokinesis of the left ventricular, basal-mid inferior wall. The left ventricle demonstrates regional wall motion abnormalities. The left ventricular internal cavity size was the left ventricle is normal in size. There is no left ventricular hypertrophy. Left ventricular diastolic parameters are consistent with Grade II diastolic dysfunction (pseudonormalization). Elevated left atrial pressure. Right Ventricle: The right ventricular size is not well visualized. Right vetricular wall thickness was not assessed. Global RV systolic function is has normal systolic function. The tricuspid regurgitant velocity is 2.42 m/s, and with an assumed right atrial pressure of 3 mmHg, the estimated right ventricular systolic pressure is normal at 26.4 mmHg. Left Atrium: Left atrial size was normal in size. Right Atrium: Right atrial size was normal in size Pericardium: Trivial pericardial effusion is present. There is pleural effusion in the left lateral region. Mitral Valve: The mitral valve is normal in structure. Trivial mitral valve regurgitation. Tricuspid Valve: The tricuspid valve is normal in structure. Tricuspid valve regurgitation is trivial. Aortic Valve: The aortic valve is normal in structure. Aortic valve regurgitation is not visualized. Mild to moderate aortic valve sclerosis/calcification is present, without any evidence of aortic stenosis. Pulmonic Valve: The pulmonic valve was not well visualized. Pulmonic valve regurgitation is not visualized. Pulmonic regurgitation is not visualized. Aorta: The aortic root is normal in size and structure. Venous: The inferior vena cava is normal in size with greater than 50%  respiratory variability, suggesting right atrial pressure of 3 mmHg. IAS/Shunts: No atrial level shunt detected by color flow Doppler.  LEFT VENTRICLE PLAX 2D LVIDd:         4.27 cm  Diastology LVIDs:         3.61 cm  LV e' lateral:   7.51 cm/s LV PW:         0.90 cm  LV E/e' lateral: 9.8 LV IVS:        0.88 cm  LV e' medial:    5.87 cm/s LVOT diam:     2.00 cm  LV E/e' medial:  12.5 LV SV:         27 ml LV SV Index:   17.29 LVOT Area:     3.14 cm  RIGHT VENTRICLE RV Basal diam:  2.26 cm RV S prime:     10.20 cm/s TAPSE (M-mode): 1.8 cm LEFT ATRIUM             Index       RIGHT ATRIUM           Index LA diam:        3.30 cm 2.12 cm/m  RA Area:     11.80 cm LA Vol (A2C):   38.0 ml 24.42 ml/m RA Volume:   29.80 ml  19.15 ml/m LA Vol (A4C):   22.0 ml 14.14 ml/m LA Biplane  Vol: 30.1 ml 19.34 ml/m  AORTIC VALVE LVOT Vmax:   94.60 cm/s LVOT Vmean:  72.300 cm/s LVOT VTI:    0.196 m  AORTA Ao Root diam: 2.90 cm MV E velocity: 73.27 cm/s 103 cm/s  TRICUSPID VALVE MV A velocity: 50.56 cm/s 70.3 cm/s TR Peak grad:   23.4 mmHg MV E/A ratio:  1.45       1.5       TR Vmax:        255.00 cm/s                                      SHUNTS                                     Systemic VTI:  0.20 m                                     Systemic Diam: 2.00 cm  Sanda Klein MD Electronically signed by Sanda Klein MD Signature Date/Time: 09/17/2019/11:28:47 AM    Final    VAS US CAROTID  Result Date: 09/17/2019 Carotid Arterial Duplex Study Indications:  Bilateral bruits and Weakness. 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 Performing Technologist: Oda Cogan RDMS, RVT  Examination Guidelines: A complete evaluation includes B-mode imaging, spectral Doppler, color Doppler, and power Doppler as needed of all accessible portions of each vessel. Bilateral testing is considered an integral part of a complete examination. Limited examinations for reoccurring indications may be performed as noted.  Right Carotid Findings:  +----------+--------+--------+--------+------------------+------------------+           PSV cm/sEDV cm/sStenosisPlaque DescriptionComments           +----------+--------+--------+--------+------------------+------------------+ CCA Prox  94      22                                                   +----------+--------+--------+--------+------------------+------------------+ CCA Distal48      17                                intimal thickening +----------+--------+--------+--------+------------------+------------------+ ICA Prox  122     45      40-59%  heterogenous                         +----------+--------+--------+--------+------------------+------------------+ ICA Mid   115     51      40-59%  heterogenous                         +----------+--------+--------+--------+------------------+------------------+ ICA Distal89      27                                                   +----------+--------+--------+--------+------------------+------------------+ ECA       458     58      >50%    heterogenous                         +----------+--------+--------+--------+------------------+------------------+ +----------+--------+-------+----------------+-------------------+  PSV cm/sEDV cmsDescribe        Arm Pressure (mmHG) +----------+--------+-------+----------------+-------------------+ Subclavian212            Multiphasic, WNL                    +----------+--------+-------+----------------+-------------------+ +---------+--------+---+--------+--+---------+ VertebralPSV cm/s111EDV cm/s38Antegrade +---------+--------+---+--------+--+---------+  Left Carotid Findings: +----------+--------+--------+--------+------------------+------------------+           PSV cm/sEDV cm/sStenosisPlaque DescriptionComments           +----------+--------+--------+--------+------------------+------------------+ CCA Prox  83      13                                                    +----------+--------+--------+--------+------------------+------------------+ CCA Distal47      10                                intimal thickening +----------+--------+--------+--------+------------------+------------------+ ICA Prox  222     62      60-79%  heterogenous      close to 60%       +----------+--------+--------+--------+------------------+------------------+ ICA Mid   132     33                                                   +----------+--------+--------+--------+------------------+------------------+ ICA Distal73      23                                                   +----------+--------+--------+--------+------------------+------------------+ ECA       202     28                                                   +----------+--------+--------+--------+------------------+------------------+ +----------+--------+--------+----------------+-------------------+           PSV cm/sEDV cm/sDescribe        Arm Pressure (mmHG) +----------+--------+--------+----------------+-------------------+ [id number]             Multiphasic, WNL                    +----------+--------+--------+----------------+-------------------+ +---------+--------+--+--------+-+---------+ VertebralPSV cm/s39EDV cm/s6Antegrade +---------+--------+--+--------+-+---------+  Summary: Right Carotid: Velocities in the right ICA are consistent 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 The ECA appears >50% stenosed. Left Carotid: Velocities in the left ICA are consistent with a 60-79% stenosis. Vertebrals: Bilateral vertebral arteries demonstrate antegrade flow. *See table(s) above for measurements and observations.  Electronically signed by Monica Martinez MD on 09/17/2019 at 5:20:01 PM.    Final      Discharge Exam: Vitals:   09/21/19 0304 09/21/19 0750  BP: 134/78 139/66  Pulse: 83 81  Resp:  18  Temp: 97.8 F (36.6 C) 97.8 F (36.6 C)   SpO2: 98% 99%   Vitals:   09/20/19 1417 09/20/19 1923 09/21/19 0304 09/21/19 0750  BP: 120/70 (!) 142/70 134/78 139/66  Pulse: 74 83 83 81  Resp:  16  18  Temp: 97.8 F (36.6 C) 98.8 F (37.1 C) 97.8 F (36.6 C) 97.8 F (36.6 C)  TempSrc: Oral Oral Oral Oral  SpO2: 96% 95% 98% 99%  Weight:      Height:        General: Pt is alert, awake, not in acute distress Cardiovascular: RRR, S1/S2 +, no rubs, no gallops Respiratory: CTA bilaterally, no wheezing, no rhonchi Abdominal: Soft, NT, ND, bowel sounds + Extremities: no edema, no cyanosis    The results of significant diagnostics from this hospitalization (including imaging, microbiology, ancillary and laboratory) are listed below for reference.     Microbiology: Recent Results (from the past 240 hour(s))  SARS CORONAVIRUS 2 (TAT 6-24 HRS) Nasopharyngeal Nasopharyngeal Swab     Status: None   Collection Time: 09/11/19 11:18 PM   Specimen: Nasopharyngeal Swab  Result Value Ref Range Status   SARS Coronavirus 2 NEGATIVE NEGATIVE Final    Comment: (NOTE) SARS-CoV-2 target nucleic acids are NOT DETECTED. The SARS-CoV-2 RNA is generally detectable in upper and lower respiratory specimens during the acute phase of infection. Negative results do not preclude SARS-CoV-2 infection, do not rule out co-infections with other pathogens, and should not be used as the sole basis for treatment or other patient management decisions. Negative results must be combined with clinical observations, patient history, and epidemiological information. The expected result is Negative. Fact Sheet for Patients: SugarRoll.be Fact Sheet for Healthcare Providers: https://www.woods-mathews.com/ This test is not yet approved or cleared by the [place] FDA and  has been authorized for detection and/or diagnosis of SARS-CoV-2 by FDA under an Emergency Use Authorization (EUA). This EUA will remain  in effect  (meaning this test can be used) for the duration of the COVID-19 declaration under Section 56 4(b)(1) of the Act, 21 U.S.C. section 360bbb-3(b)(1), unless the authorization is terminated or revoked sooner. Performed at [facility], [place] [address] [place], [place] [address]   SARS CORONAVIRUS 2 (TAT 6-24 HRS) Nasopharyngeal Nasopharyngeal Swab     Status: None   Collection Time: 09/14/19 11:15 AM   Specimen: Nasopharyngeal Swab  Result Value Ref Range Status   SARS Coronavirus 2 NEGATIVE NEGATIVE Final    Comment: (NOTE) SARS-CoV-2 target nucleic acids are NOT DETECTED. The SARS-CoV-2 RNA is generally detectable in upper and lower respiratory specimens during the acute phase of infection. Negative results do not preclude SARS-CoV-2 infection, do not rule out co-infections with other pathogens, and should not be used as the sole basis for treatment or other patient management decisions. Negative results must be combined with clinical observations, patient history, and epidemiological information. The expected result is Negative. Fact Sheet for Patients: SugarRoll.be Fact Sheet for Healthcare Providers: https://www.woods-mathews.com/ This test is not yet approved or cleared by the [place] FDA and  has been authorized for detection and/or diagnosis of SARS-CoV-2 by FDA under an Emergency Use Authorization (EUA). This EUA will remain  in effect (meaning this test can be used) for the duration of the COVID-19 declaration under Section 56 4(b)(1) of the Act, 21 U.S.C. section 360bbb-3(b)(1), unless the authorization is terminated or revoked sooner. Performed at [facility], [place] [address], [place], [place] [address]   SARS CORONAVIRUS 2 (TAT 6-24 HRS) Nasopharyngeal Nasopharyngeal Swab     Status: None   Collection Time: 09/16/19  4:22 PM   Specimen: Nasopharyngeal Swab  Result Value  Ref Range Status   SARS Coronavirus 2  NEGATIVE NEGATIVE Final    Comment: (NOTE) SARS-CoV-2 target nucleic acids are NOT DETECTED. The SARS-CoV-2 RNA is generally detectable in upper and lower respiratory specimens during the acute phase of infection. Negative results do not preclude SARS-CoV-2 infection, do not rule out co-infections with other pathogens, and should not be used as the sole basis for treatment or other patient management decisions. Negative results must be combined with clinical observations, patient history, and epidemiological information. The expected result is Negative. Fact Sheet for Patients: SugarRoll.be Fact Sheet for Healthcare Providers: https://www.woods-mathews.com/ This test is not yet approved or cleared by the [place] FDA and  has been authorized for detection and/or diagnosis of SARS-CoV-2 by FDA under an Emergency Use Authorization (EUA). This EUA will remain  in effect (meaning this test can be used) for the duration of the COVID-19 declaration under Section 56 4(b)(1) of the Act, 21 U.S.C. section 360bbb-3(b)(1), unless the authorization is terminated or revoked sooner. Performed at [facility], [place] [address] [place], [place] [address]   SARS CORONAVIRUS 2 (TAT 6-24 HRS) Nasopharyngeal Nasopharyngeal Swab     Status: None   Collection Time: 09/19/19  4:24 PM   Specimen: Nasopharyngeal Swab  Result Value Ref Range Status   SARS Coronavirus 2 NEGATIVE NEGATIVE Final    Comment: (NOTE) SARS-CoV-2 target nucleic acids are NOT DETECTED. The SARS-CoV-2 RNA is generally detectable in upper and lower respiratory specimens during the acute phase of infection. Negative results do not preclude SARS-CoV-2 infection, do not rule out co-infections with other pathogens, and should not be used as the sole basis for treatment or other patient management decisions. Negative results must be combined with clinical observations, patient history,  and epidemiological information. The expected result is Negative. Fact Sheet for Patients: SugarRoll.be Fact Sheet for Healthcare Providers: https://www.woods-mathews.com/ This test is not yet approved or cleared by the [place] FDA and  has been authorized for detection and/or diagnosis of SARS-CoV-2 by FDA under an Emergency Use Authorization (EUA). This EUA will remain  in effect (meaning this test can be used) for the duration of the COVID-19 declaration under Section 56 4(b)(1) of the Act, 21 U.S.C. section 360bbb-3(b)(1), unless the authorization is terminated or revoked sooner. Performed at [facility], [place] [address], [place], [place] [address]      Labs: BNP (last 3 results) No results for input(s): BNP in the last 8760 hours. Basic Metabolic Panel: Recent Labs  Lab 09/17/19 0241 09/18/19 0353 09/19/19 0427 09/20/19 0453 09/21/19 0354  NA 129* 132* 132* 130* 129*  K 4.0 4.3 3.9 3.8 3.9  CL 102 103 103 102 99  CO2 20* 21* 21* 22 22  GLUCOSE 89 89 89 90 94  BUN 27* 29* 25* 26* 29*  CREATININE 1.20 1.34* 1.22 1.30* 1.39*  CALCIUM 9.7 10.1 9.6 9.5 9.5  MG  --   --   --  1.9  --    Liver Function Tests: Recent Labs  Lab 09/15/19 0411  AST 32  ALT 29  ALKPHOS 52  BILITOT 0.7  PROT 4.9*  ALBUMIN 1.9*   No results for input(s): LIPASE, AMYLASE in the last 168 hours. No results for input(s): AMMONIA in the last 168 hours. CBC: Recent Labs  Lab 09/16/19 0417 09/17/19 0241 09/20/19 0453  WBC 8.8 8.2 8.7  NEUTROABS  --  5.9 6.1  HGB 8.5* 8.6* 9.1*  HCT 25.5* 24.9* 26.5*  MCV  95.1 92.6 92.7  PLT 447* 451* 491*   Cardiac Enzymes: Recent Labs  Lab 09/15/19 0411  CKTOTAL 106   BNP: Invalid input(s): POCBNP CBG: No results for input(s): GLUCAP in the last 168 hours. D-Dimer No results for input(s): DDIMER in the last 72 hours. Hgb A1c No results for input(s): HGBA1C in the last 72 hours. Lipid  Profile No results for input(s): CHOL, HDL, LDLCALC, TRIG, CHOLHDL, LDLDIRECT in the last 72 hours. Thyroid function studies No results for input(s): TSH, T4TOTAL, T3FREE, THYROIDAB in the last 72 hours.  Invalid input(s): FREET3 Anemia work up Recent Labs    09/19/19 0427  FOLATE 15.2  TIBC 174*  IRON 20*  RETICCTPCT 3.0   Urinalysis    Component Value Date/Time   COLORURINE YELLOW 09/12/2019 0153   APPEARANCEUR HAZY (A) 09/12/2019 0153   LABSPEC 1.015 09/12/2019 0153   PHURINE 6.0 09/12/2019 0153   GLUCOSEU NEGATIVE 09/12/2019 0153   HGBUR NEGATIVE 09/12/2019 0153   BILIRUBINUR NEGATIVE 09/12/2019 0153   KETONESUR 5 (A) 09/12/2019 0153   PROTEINUR >=300 (A) 09/12/2019 0153   NITRITE NEGATIVE 09/12/2019 0153   LEUKOCYTESUR NEGATIVE 09/12/2019 0153   Sepsis Labs Invalid input(s): PROCALCITONIN,  WBC,  LACTICIDVEN Microbiology Recent Results (from the past 240 hour(s))  SARS CORONAVIRUS 2 (TAT 6-24 HRS) Nasopharyngeal Nasopharyngeal Swab     Status: None   Collection Time: 09/11/19 11:18 PM   Specimen: Nasopharyngeal Swab  Result Value Ref Range Status   SARS Coronavirus 2 NEGATIVE NEGATIVE Final    Comment: (NOTE) SARS-CoV-2 target nucleic acids are NOT DETECTED. The SARS-CoV-2 RNA is generally detectable in upper and lower respiratory specimens during the acute phase of infection. Negative results do not preclude SARS-CoV-2 infection, do not rule out co-infections with other pathogens, and should not be used as the sole basis for treatment or other patient management decisions. Negative results must be combined with clinical observations, patient history, and epidemiological information. The expected result is Negative. Fact Sheet for Patients: SugarRoll.be Fact Sheet for Healthcare Providers: https://www.woods-mathews.com/ This test is not yet approved or cleared by the [place] FDA and  has been authorized for  detection and/or diagnosis of SARS-CoV-2 by FDA under an Emergency Use Authorization (EUA). This EUA will remain  in effect (meaning this test can be used) for the duration of the COVID-19 declaration under Section 56 4(b)(1) of the Act, 21 U.S.C. section 360bbb-3(b)(1), unless the authorization is terminated or revoked sooner. Performed at [facility], [place] [address], [place], [place] [address]   SARS CORONAVIRUS 2 (TAT 6-24 HRS) Nasopharyngeal Nasopharyngeal Swab     Status: None   Collection Time: 09/14/19 11:15 AM   Specimen: Nasopharyngeal Swab  Result Value Ref Range Status   SARS Coronavirus 2 NEGATIVE NEGATIVE Final    Comment: (NOTE) SARS-CoV-2 target nucleic acids are NOT DETECTED. The SARS-CoV-2 RNA is generally detectable in upper and lower respiratory specimens during the acute phase of infection. Negative results do not preclude SARS-CoV-2 infection, do not rule out co-infections with other pathogens, and should not be used as the sole basis for treatment or other patient management decisions. Negative results must be combined with clinical observations, patient history, and epidemiological information. The expected result is Negative. Fact Sheet for Patients: SugarRoll.be Fact Sheet for Healthcare Providers: https://www.woods-mathews.com/ This test is not yet approved or cleared by the [place] FDA and  has been authorized for detection and/or diagnosis of SARS-CoV-2 by FDA under an Emergency Use Authorization (EUA).  This EUA will remain  in effect (meaning this test can be used) for the duration of the COVID-19 declaration under Section 56 4(b)(1) of the Act, 21 U.S.C. section 360bbb-3(b)(1), unless the authorization is terminated or revoked sooner. Performed at [facility], [place] [address] [place], [place] [address]   SARS CORONAVIRUS 2 (TAT 6-24 HRS) Nasopharyngeal Nasopharyngeal Swab     Status: None    Collection Time: 09/16/19  4:22 PM   Specimen: Nasopharyngeal Swab  Result Value Ref Range Status   SARS Coronavirus 2 NEGATIVE NEGATIVE Final    Comment: (NOTE) SARS-CoV-2 target nucleic acids are NOT DETECTED. The SARS-CoV-2 RNA is generally detectable in upper and lower respiratory specimens during the acute phase of infection. Negative results do not preclude SARS-CoV-2 infection, do not rule out co-infections with other pathogens, and should not be used as the sole basis for treatment or other patient management decisions. Negative results must be combined with clinical observations, patient history, and epidemiological information. The expected result is Negative. Fact Sheet for Patients: SugarRoll.be Fact Sheet for Healthcare Providers: https://www.woods-mathews.com/ This test is not yet approved or cleared by the [place] FDA and  has been authorized for detection and/or diagnosis of SARS-CoV-2 by FDA under an Emergency Use Authorization (EUA). This EUA will remain  in effect (meaning this test can be used) for the duration of the COVID-19 declaration under Section 56 4(b)(1) of the Act, 21 U.S.C. section 360bbb-3(b)(1), unless the authorization is terminated or revoked sooner. Performed at [facility], [place] [address] [place], [place] [address]   SARS CORONAVIRUS 2 (TAT 6-24 HRS) Nasopharyngeal Nasopharyngeal Swab     Status: None   Collection Time: 09/19/19  4:24 PM   Specimen: Nasopharyngeal Swab  Result Value Ref Range Status   SARS Coronavirus 2 NEGATIVE NEGATIVE Final    Comment: (NOTE) SARS-CoV-2 target nucleic acids are NOT DETECTED. The SARS-CoV-2 RNA is generally detectable in upper and lower respiratory specimens during the acute phase of infection. Negative results do not preclude SARS-CoV-2 infection, do not rule out co-infections with other pathogens, and should not be used as the sole basis for  treatment or other patient management decisions. Negative results must be combined with clinical observations, patient history, and epidemiological information. The expected result is Negative. Fact Sheet for Patients: SugarRoll.be Fact Sheet for Healthcare Providers: https://www.woods-mathews.com/ This test is not yet approved or cleared by the [place] FDA and  has been authorized for detection and/or diagnosis of SARS-CoV-2 by FDA under an Emergency Use Authorization (EUA). This EUA will remain  in effect (meaning this test can be used) for the duration of the COVID-19 declaration under Section 56 4(b)(1) of the Act, 21 U.S.C. section 360bbb-3(b)(1), unless the authorization is terminated or revoked sooner. Performed at [facility], [place] [address], [place], [place] [address]      Time coordinating discharge: 35 minutes  SIGNED:   Rodena Goldmann, DO [facility] 09/21/2019, 9:04 AM  If 7PM-7AM, please contact night-coverage [email or web address]

## 2019-09-21 NOTE — Plan of Care (Signed)
  Problem: Clinical Measurements: Goal: Ability to maintain clinical measurements within normal limits will improve Outcome: Progressing   Problem: Clinical Measurements: Goal: Cardiovascular complication will be avoided Outcome: Progressing   Problem: Nutrition: Goal: Adequate nutrition will be maintained Outcome: Progressing   Problem: Safety: Goal: Ability to remain free from injury will improve Outcome: Progressing   Problem: Elimination: Goal: Will not experience complications related to bowel motility Outcome: Progressing

## 2019-09-21 NOTE — TOC Transition Note (Addendum)
Transition of Care [facility]) - CM/SW Discharge Note   Patient Details  Name: Anthony Andrews MRN: [id number] Date of Birth: [birth year]  [facility] CM/SW Contact:  Atilano Median, LCSW Phone Number: 09/21/2019, 11:53 AM   Clinical Narrative:    Discharged to SNF. Referral coordinated with Jackelyn Poling [phone]. Patient aware and agreeable to this plan. Number to all report [place] nurse Lerry Paterson given to unit RN Doroteo Bradford. No other needs at this time. Case closed to this CSW.    Patient's son Iona Beard informed CSW that he received a letter stating that the patient has [facility] 09/17/19-10/24/19. If anyone needs this letter he is able to provide them with it.   Final next level of care: Skilled Nursing Facility Barriers to Discharge: Barriers Resolved   Patient Goals and CMS Choice   CMS Medicare.gov Compare Post Acute Care list provided to:: Patient Represenative (must comment)(son Iona Beard) Choice offered to / list presented to : Adult Children  Discharge Placement   Existing PASRR number confirmed : 09/21/19          Patient chooses bed at: Other - please specify in the comment section below:([facility]) Patient to be transferred to facility by: [facility] Name of family member notified: Iona Beard Patient and family notified of of transfer: 09/21/19  Discharge Plan and Services                                     Social Determinants of Health (SDOH) Interventions     Readmission Risk Interventions No flowsheet data found.

## 2019-09-21 NOTE — Plan of Care (Signed)

## 2019-09-21 NOTE — Progress Notes (Signed)
Per Tele tech at 07:07 this morning, pt had a 2.3 second pause. Pt returned back to SR 78 without intervention. Day shift made aware. Pt in no distress.  MD made rounds, Pt with possible transfer to [facility] facility.

## 2019-09-23 ENCOUNTER — Telehealth: Payer: Self-pay | Admitting: Radiology

## 2019-09-23 NOTE — Telephone Encounter (Signed)
Tried calling patient was unable to leave a msg. Need to verify address, insurance and briefly go over monitor instructions. Patient looks to have been discharged to a SNF.

## 2019-10-01 ENCOUNTER — Encounter: Payer: Self-pay | Admitting: Physician Assistant

## 2019-10-01 ENCOUNTER — Telehealth (INDEPENDENT_AMBULATORY_CARE_PROVIDER_SITE_OTHER): Payer: Medicare PPO | Admitting: Physician Assistant

## 2019-10-01 ENCOUNTER — Telehealth: Payer: Self-pay

## 2019-10-01 VITALS — BP 130/68 | HR 72 | Temp 98.1°F | Resp 18 | Ht 68.0 in | Wt 188.0 lb

## 2019-10-01 DIAGNOSIS — Z8673 Personal history of transient ischemic attack (TIA), and cerebral infarction without residual deficits: Secondary | ICD-10-CM

## 2019-10-01 DIAGNOSIS — I6523 Occlusion and stenosis of bilateral carotid arteries: Secondary | ICD-10-CM

## 2019-10-01 DIAGNOSIS — Z7189 Other specified counseling: Secondary | ICD-10-CM

## 2019-10-01 DIAGNOSIS — I69354 Hemiplegia and hemiparesis following cerebral infarction affecting left non-dominant side: Secondary | ICD-10-CM

## 2019-10-01 DIAGNOSIS — I441 Atrioventricular block, second degree: Secondary | ICD-10-CM

## 2019-10-01 DIAGNOSIS — Z7901 Long term (current) use of anticoagulants: Secondary | ICD-10-CM

## 2019-10-01 DIAGNOSIS — I480 Paroxysmal atrial fibrillation: Secondary | ICD-10-CM

## 2019-10-01 DIAGNOSIS — I4430 Unspecified atrioventricular block: Secondary | ICD-10-CM | POA: Diagnosis not present

## 2019-10-01 DIAGNOSIS — I10 Essential (primary) hypertension: Secondary | ICD-10-CM

## 2019-10-01 DIAGNOSIS — W19XXXD Unspecified fall, subsequent encounter: Secondary | ICD-10-CM

## 2019-10-01 NOTE — Telephone Encounter (Signed)
Called patient twice for virtual visit. Left message on number patient requested a call on and called home number as well, no answer.   Spoke with patients son who stated patient is in a nursing home in [facility] and the phone number is [phone].

## 2019-10-01 NOTE — Telephone Encounter (Signed)

## 2019-10-01 NOTE — Telephone Encounter (Signed)
Contacted patient to discuss AVS Instructions. Gave patient [name]'s recommendations from today's virtual office visit. Follow up appt scheduled with [name] in 6 weeks. Patient voiced understanding and AVS mailed.

## 2019-10-01 NOTE — Patient Instructions (Signed)
Medication Instructions:  Your physician recommends that you continue on your current medications as directed. Please refer to the Current Medication list given to you today. *If you need a refill on your cardiac medications before your next appointment, please call your pharmacy*  Lab Work: None  If you have labs (blood work) drawn today and your tests are completely normal, you will receive your results only by: Marland Kitchen MyChart Message (if you have MyChart) OR . A paper copy in the mail If you have any lab test that is abnormal or we need to change your treatment, we will call you to review the results.  Testing/Procedures: Monitor will be mailed to the patient  Follow-Up: At [facility], you and your health needs are our priority.  As part of our continuing mission to provide you with exceptional heart care, we have created designated Provider Care Teams.  These Care Teams include your primary Cardiologist (physician) and Advanced Practice Providers (APPs -  Physician Assistants and Nurse Practitioners) who all work together to provide you with the care you need, when you need it.  Your next appointment:   6 week(s)  The format for your next appointment:   Virtual Visit   Provider:   Quay Burow, MD  Almyra Deforest, PA-C  Other Instructions

## 2019-10-01 NOTE — Progress Notes (Signed)
Virtual Visit via Telephone Note   This visit type was conducted due to national recommendations for restrictions regarding the COVID-19 Pandemic (e.g. social distancing) in an effort to limit this patient's exposure and mitigate transmission in our community.  Due to his co-morbid illnesses, this patient is at least at moderate risk for complications without adequate follow up.  This format is felt to be most appropriate for this patient at this time.  The patient did not have access to video technology/had technical difficulties with video requiring transitioning to audio format only (telephone).  All issues noted in this document were discussed and addressed.  No physical exam could be performed with this format.  Please refer to the patient's chart for his  consent to telehealth for [facility].   Date:  10/01/2019   ID:  Anthony Andrews, DOB 15-Nov-1945, MRN [id number]  Patient Location: Home Provider Location: Office  PCP:  Patient, No Pcp Per  Cardiologist:  Quay Burow, MD  Electrophysiologist:  None   Evaluation Performed:  Follow-Up Visit  Chief Complaint:  followup  History of Present Illness:    Anthony Andrews is a 74 y.o. 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  Patient was recently admitted in December 2020 after having an unwitnessed fall at home.  He was on the ground for approximately 18 hours before found by his son.  He was subsequently admitted for rhabdomyolysis, AKI and acute metabolic encephalopathy.  During the hospitalization, cardiology service was consulted for tachycardia.  Patient was seen by Dr. Gwenlyn Found who felt he has underlying paroxysmal atrial fibrillation.  Aspirin was discontinued and he was started on Eliquis.  Echocardiogram obtained on 09/17/2019 showed EF 45 to 50%, basal mid inferior hypokinesis, grade 2 DD.  Carotid ultrasound  showed 40 to 59% right ICA stenosis, 60 to 79% left ICA stenosis.  While on the telemetry, it was noted he had a second-degree AV block however he was asymptomatic.  Dr. Gwenlyn Found did not think the second-degree AV block was contributory to his fall.  Outpatient event monitor was recommended.  Patient presents today for virtual visit. He currently resides in [facility] in [place]. I discussed the patient's progress with his charge nurse Mrs. Nicole Kindred (cell phone [phone]) of [facility] in [place].  I was unable to talk to the patient as he is extremely weak and only able to answer yes or no.  He was not able to communicate during today's interview.  Majority of the information was obtained from Mrs. Shomali.  According to the charge nurse, he has been recovering very slowly since he reached the facility.  He does complain of occasional aches and chills, however no obvious fever.  There has been Covid outbreak at the facility, therefore today's visit was transitioned to virtual visit instead.  He does not have any cardiac awareness of atrial fibrillation.  Blood pressure and heart rate were very well controlled.  He denies any obvious chest pain worsening shortness of breath.  I recommended complete 14 day heart monitor before 6 weeks of follow-up.  We will send him the heart monitor for him to wear.  Mailing address below: Attn: Nicole Kindred for Mr. [facility] [place] [address]  Phone: [phone] Fax: [phone]    The patient does not have symptoms concerning for COVID-19 infection (fever, chills, cough, or new shortness of breath).  Past Medical History:  Diagnosis Date  . Acute metabolic encephalopathy   . AKI (acute kidney injury) ([place])   . Chronic hyponatremia   . Essential hypertension   . Rhabdomyolysis   . Stroke ([place])   . Tobacco use    Past Surgical History:  Procedure Laterality Date  . BACK SURGERY    .  SHOULDER SURGERY       Current Meds  Medication Sig  . albuterol (VENTOLIN HFA) 108 (90 Base) MCG/ACT inhaler Inhale 2 puffs into the lungs every 4 (four) hours as needed for wheezing or shortness of breath.   Marland Kitchen apixaban (ELIQUIS) 5 MG TABS tablet Take 1 tablet (5 mg total) by mouth 2 (two) times daily.  Marland Kitchen aspirin EC 81 MG tablet Take 81 mg by mouth every morning.  . cholecalciferol (VITAMIN D3) 25 MCG (1000 UT) tablet Take 1,000 Units by mouth daily.  Marland Kitchen ibuprofen (ADVIL) 200 MG tablet Take 400 mg by mouth every 6 (six) hours as needed for headache (pain).  . metoprolol tartrate (LOPRESSOR) 25 MG tablet Take 0.5 tablets (12.5 mg total) by mouth daily.  Marland Kitchen PARoxetine (PAXIL) 20 MG tablet Take 20 mg by mouth daily.   . rosuvastatin (CRESTOR) 10 MG tablet Take 10 mg by mouth at bedtime.  . sodium bicarbonate 650 MG tablet Take 1 tablet (650 mg total) by mouth 2 (two) times daily.  . tamsulosin (FLOMAX) 0.4 MG CAPS capsule Take 1 capsule (0.4 mg total) by mouth daily.     Allergies:   Patient has no known allergies.   Social History   Tobacco Use  . Smoking status: Current Every Day Smoker    Types: Cigarettes  . Smokeless tobacco: Former Systems developer  . Tobacco comment: 3 cigs/per day  Substance Use Topics  . Alcohol use: Not Currently  . Drug use: Never     Family Hx: The patient's family history includes Heart attack in his father and mother.  ROS:   Please see the history of present illness.     All other systems reviewed and are negative.   Prior CV studies:   The following studies were reviewed today:  Echo 09/17/2019  1. Left ventricular ejection fraction, by visual estimation, is 45 to 50%. The left ventricle has mildly decreased function. There is no left ventricular hypertrophy.  2. Severe hypokinesis of the left ventricular, basal-mid inferior wall.  3. Elevated left atrial pressure.  4. Left ventricular diastolic parameters are consistent with Grade II diastolic  dysfunction (pseudonormalization).  5. Global right ventricle has normal systolic function.The right ventricular size is not well visualized. Right vetricular wall thickness was not assessed.  6. Left atrial size was normal.  7. Right atrial size was normal.  8. Trivial pericardial effusion is present.  9. The mitral valve is normal in structure. Trivial mitral valve regurgitation. 10. The tricuspid valve is normal in structure. 11. The aortic valve is normal in structure. Aortic valve regurgitation is not visualized. Mild to moderate aortic valve sclerosis/calcification without any evidence of aortic stenosis. 12. The pulmonic valve was not well visualized. Pulmonic valve regurgitation is not visualized. 13. Normal pulmonary artery systolic pressure. 14. The inferior vena cava is normal in size with greater than 50% respiratory variability, suggesting right atrial pressure of 3 mmHg.   Carotid US 09/17/2019 Summary: Right Carotid: Velocities in the right ICA are consistent with a 40-59% stenosis. The ECA appears >50% stenosed.  Left Carotid: Velocities in the left ICA are consistent with a  60-79% stenosis.  Labs/Other Tests and Data Reviewed:    EKG:  An ECG dated 09/15/2019 was personally reviewed today and demonstrated:  sinus tachycardia with PACs  Recent Labs: 09/12/2019: TSH 3.292 09/15/2019: ALT 29 09/20/2019: Hemoglobin 9.1; Magnesium 1.9; Platelets 491 09/21/2019: BUN 29; Creatinine, Ser 1.39; Potassium 3.9; Sodium 129   Recent Lipid Panel No results found for: CHOL, TRIG, HDL, CHOLHDL, LDLCALC, LDLDIRECT  Wt Readings from Last 3 Encounters:  10/01/19 188 lb (85.3 kg)  09/11/19 120 lb (54.4 kg)     Objective:    Vital Signs:  BP 130/68   Pulse 72   Temp 98.1 F (36.7 C) (Temporal)   Resp 18   Ht 5\' 8"  (1.727 m)   Wt 188 lb (85.3 kg)   SpO2 98%   BMI 28.59 kg/m    VITAL SIGNS:  reviewed  ASSESSMENT & PLAN:    1. PAF: He was started on Eliquis and  metoprolol during recent hospitalization.  Last EKG actually shows he has converted to sinus tachycardia.  He has no cardiac awareness of atrial fibrillation.  2. Carotid artery disease: Moderate disease noted on recent carotid Doppler.  3. Recent fall: Unfortunately he was down on the floor for more than 16 hours, resulted in acute kidney injury and rhabdomyolysis.  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  He is only on metoprolol tartrate 12.5 mg daily instead of twice a day.  This was the dose that was picked during the hospitalization.  He is due for a 2-week heart monitor to look for prolonged pauses and worsening conduction disease.  Mailing address above.  5. Hypertension: Blood pressure stable on current therapy.  6. History of CVA: Likely related to atrial fibrillation.  He was started on Eliquis during the recent hospitalization.  COVID-19 Education: The signs and symptoms of COVID-19 were discussed with the patient and how to seek care for testing (follow up with PCP or arrange E-visit).  The importance of social distancing was discussed today.  Time:   Today, I have spent 10 minutes with the patient with telehealth technology discussing the above problems.     Medication Adjustments/Labs and Tests Ordered: Current medicines are reviewed at length with the patient today.  Concerns regarding medicines are outlined above.   Tests Ordered: No orders of the defined types were placed in this encounter.   Medication Changes: No orders of the defined types were placed in this encounter.   Follow Up:  Virtual Visit  in 6 week(s)  Signed, Almyra Deforest, [place]  10/01/2019 4:56 PM    [facility]

## 2019-10-01 NOTE — Telephone Encounter (Signed)
Unable to reach patient. Need to verify his info to have a monitor shipped.

## 2019-10-01 NOTE — Telephone Encounter (Signed)
Received call from charge nurse name Dedra Skeens she stated she would like for the patients appt to be rescheduled for later today. She requested to be contacted on her cell phone at [phone]. Advised her that I would call her at 3:45 to get patients visit started. She voiced understanding. Appointment moved to today at 4 pm.

## 2019-10-01 NOTE — Telephone Encounter (Signed)
Called facility and requested to speak with a nurse and the receptionist stated I could not speak with a nurse because they are passing meds. I asked if I could leave a message and she said not she can not get up from her desk. She stated I would need to call back later once the nurse is finished passing meds.   Per Anthony Andrews he wants me to have patient reschedule his appointment.

## 2019-10-02 ENCOUNTER — Telehealth: Payer: Self-pay | Admitting: Physician Assistant

## 2019-10-02 NOTE — Telephone Encounter (Signed)
New Message     Anthony Andrews is calling from [employer] to inform that the Monitor is on hold for being mailed because they are needing prior authorization from the insurance

## 2019-10-02 NOTE — Telephone Encounter (Signed)
Enrolled patient for a 14 day Zio Telemetry monitor to be mailed to patients Rehab center.  Attn Nicole Kindred for Mr [facility] @[place]   [place] [phone]

## 2019-10-02 NOTE — Telephone Encounter (Signed)
AUTH # [id number] EXP 11/01/19 FOR [address]86 PHYSICIAN READING FEE FOR MONITOR

## 2019-10-02 NOTE — Telephone Encounter (Signed)
Thank you.  We were unaware of this.  I will start the auth request now.  Thank you.  Caryl Pina

## 2019-11-02 ENCOUNTER — Ambulatory Visit: Payer: Medicare PPO | Admitting: Neurology

## 2019-11-17 ENCOUNTER — Other Ambulatory Visit: Payer: Self-pay

## 2019-11-17 ENCOUNTER — Ambulatory Visit (INDEPENDENT_AMBULATORY_CARE_PROVIDER_SITE_OTHER): Payer: Medicare PPO | Admitting: Physician Assistant

## 2019-11-17 ENCOUNTER — Encounter (INDEPENDENT_AMBULATORY_CARE_PROVIDER_SITE_OTHER): Payer: Self-pay

## 2019-11-17 ENCOUNTER — Telehealth: Payer: Medicare PPO | Admitting: Physician Assistant

## 2019-11-17 VITALS — BP 96/58 | HR 68 | Temp 97.7°F | Ht 63.0 in | Wt 125.8 lb

## 2019-11-17 DIAGNOSIS — I10 Essential (primary) hypertension: Secondary | ICD-10-CM

## 2019-11-17 DIAGNOSIS — R55 Syncope and collapse: Secondary | ICD-10-CM

## 2019-11-17 DIAGNOSIS — Z8673 Personal history of transient ischemic attack (TIA), and cerebral infarction without residual deficits: Secondary | ICD-10-CM

## 2019-11-17 DIAGNOSIS — I480 Paroxysmal atrial fibrillation: Secondary | ICD-10-CM | POA: Diagnosis not present

## 2019-11-17 NOTE — Progress Notes (Addendum)
Cardiology Office Note:    Date:  11/19/2019   ID:  Anthony Andrews, DOB 1946-05-06, MRN [id number]  PCP:  Patient, No Pcp Per  Cardiologist:  Quay Burow, MD  Electrophysiologist:  None   Referring MD: No ref. provider found   Chief Complaint  Patient presents with  . Follow-up    seen for Dr. Gwenlyn Found    History of Present Illness:    Anthony Andrews is a 74 y.o. 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  Patient was recently admitted in December 2020 after having an unwitnessed fall at home.  He was on the ground for approximately 18 hours before found by his son.  He was subsequently admitted for rhabdomyolysis, AKI and acute metabolic encephalopathy.  During the hospitalization, cardiology service was consulted for tachycardia.  Patient was seen by Dr. Gwenlyn Found who felt he has underlying paroxysmal atrial fibrillation.  Aspirin was discontinued and he was started on Eliquis.  Echocardiogram obtained on 09/17/2019 showed EF 45 to 50%, basal mid inferior hypokinesis, grade 2 DD.  Carotid ultrasound showed 40 to 59% right ICA stenosis, 60 to 79% left ICA stenosis.  While on the telemetry, it was noted he had a second-degree AV block however he was asymptomatic.  Dr. Gwenlyn Found did not think the second-degree AV block was contributory to his fall.  Outpatient event monitor was recommended.  Last time I reached out via virtual visit to [facility] which is the nursing facility he resides in [place].  He was unable to talk last time and it was only able to answer yes or no question.  Majority of the information was obtained from Mrs. Shomali who was in charge of his care.  He was has been recovering very slowly since he reached the facility and occasionally complaining of fatigue and chills.  I recommended a 14-day heart monitor prior to the follow-up.  Unfortunately  the heart monitor was never done.  Today's appointment was initially supposed to be virtual visit, however patient showed up in the office.  Therefore this was converted to oral office visit instead.  He presents today along with his son.  He is quite alert and oriented and seems to have significant improvement since the last conversation.  He did have further episode of dizzy spell.  However sometimes he can go for over a month without any symptom.  I recommended switching his previous 14-day heart monitor for a 30-day event monitor.  This will be sent to his nursing facility.  Otherwise he denies any significant chest discomfort or or shortness of breath.  He appears to be euvolemic on physical exam.   Past Medical History:  Diagnosis Date  . Acute metabolic encephalopathy   . AKI (acute kidney injury) ([place])   . Chronic hyponatremia   . Essential hypertension   . Rhabdomyolysis   . Stroke ([place])   . Tobacco use     Past Surgical History:  Procedure Laterality Date  . BACK SURGERY    . SHOULDER SURGERY      Current Medications: No outpatient medications have been marked as taking for the 11/17/19 encounter (Office Visit) with Almyra Deforest, PA.     Allergies:   Patient has no known allergies.   Social History   Socioeconomic History  . Marital status: Married    Spouse name: Not on file  . Number of children: Not on  file  . Years of education: Not on file  . Highest education level: Not on file  Occupational History  . Not on file  Tobacco Use  . Smoking status: Current Every Day Smoker    Types: Cigarettes  . Smokeless tobacco: Former Systems developer  . Tobacco comment: 3 cigs/per day  Substance and Sexual Activity  . Alcohol use: Not Currently  . Drug use: Never  . Sexual activity: Not on file  Other Topics Concern  . Not on file  Social History Narrative  . Not on file   Social Determinants of Health   Financial Resource Strain:   . Difficulty of Paying Living Expenses: Not  on file  Food Insecurity:   . Worried About Charity fundraiser in the Last Year: Not on file  . Ran Out of Food in the Last Year: Not on file  Transportation Needs:   . Lack of Transportation (Medical): Not on file  . Lack of Transportation (Non-Medical): Not on file  Physical Activity:   . Days of Exercise per Week: Not on file  . Minutes of Exercise per Session: Not on file  Stress:   . Feeling of Stress : Not on file  Social Connections:   . Frequency of Communication with Friends and Family: Not on file  . Frequency of Social Gatherings with Friends and Family: Not on file  . Attends Religious Services: Not on file  . Active Member of Clubs or Organizations: Not on file  . Attends Archivist Meetings: Not on file  . Marital Status: Not on file     Family History: The patient's family history includes Heart attack in his father and mother.  ROS:   Please see the history of present illness.     All other systems reviewed and are negative.  EKGs/Labs/Other Studies Reviewed:    The following studies were reviewed today:  Echo 09/17/2019 1. Left ventricular ejection fraction, by visual estimation, is 45 to  50%. The left ventricle has mildly decreased function. There is no left  ventricular hypertrophy.  2. Severe hypokinesis of the left ventricular, basal-mid inferior wall.  3. Elevated left atrial pressure.  4. Left ventricular diastolic parameters are consistent with Grade II  diastolic dysfunction (pseudonormalization).  5. Global right ventricle has normal systolic function.The right  ventricular size is not well visualized. Right vetricular wall thickness  was not assessed.  6. Left atrial size was normal.  7. Right atrial size was normal.  8. Trivial pericardial effusion is present.  9. The mitral valve is normal in structure. Trivial mitral valve  regurgitation.  10. The tricuspid valve is normal in structure.  11. The aortic valve is normal  in structure. Aortic valve regurgitation is  not visualized. Mild to moderate aortic valve sclerosis/calcification  without any evidence of aortic stenosis.  12. The pulmonic valve was not well visualized. Pulmonic valve  regurgitation is not visualized.  13. Normal pulmonary artery systolic pressure.  14. The inferior vena cava is normal in size with greater than 50%  respiratory variability, suggesting right atrial pressure of 3 mmHg.   EKG:  EKG is ordered today.  I have personally reviewed his EKG which demonstrated normal sinus rhythm without significant ST-T wave changes.  Recent Labs: 09/12/2019: TSH 3.292 09/15/2019: ALT 29 09/20/2019: Hemoglobin 9.1; Magnesium 1.9; Platelets 491 09/21/2019: BUN 29; Creatinine, Ser 1.39; Potassium 3.9; Sodium 129  Recent Lipid Panel No results found for: CHOL, TRIG, HDL, CHOLHDL, VLDL, LDLCALC, LDLDIRECT  Physical Exam:    VS:  BP (!) 96/58   Pulse 68   Temp 97.7 F (36.5 C)   Ht 5\' 3"  (1.6 m)   Wt 125 lb 12.8 oz (57.1 kg)   SpO2 93%   BMI 22.28 kg/m     Wt Readings from Last 3 Encounters:  11/17/19 125 lb 12.8 oz (57.1 kg)  10/01/19 188 lb (85.3 kg)  09/11/19 120 lb (54.4 kg)     GEN:  Well nourished, well developed in no acute distress HEENT: Normal NECK: No JVD; No carotid bruits LYMPHATICS: No lymphadenopathy CARDIAC: RRR, no murmurs, rubs, gallops RESPIRATORY:  Clear to auscultation without rales, wheezing or rhonchi  ABDOMEN: Soft, non-tender, non-distended MUSCULOSKELETAL:  No edema; No deformity  SKIN: Warm and dry NEUROLOGIC:  Alert and oriented x 3 PSYCHIATRIC:  Normal affect   ASSESSMENT:    1. Syncope and collapse   2. Essential hypertension   3. PAF (paroxysmal atrial fibrillation) ([place])   4. H/O: CVA (cerebrovascular accident)    PLAN:    In order of problems listed above:  1. Syncope: He had another episode of possible syncope in February.  Previous episode was in December.  I recommended switching to  14-day heart monitor to a 30-day event monitor.  During the previous hospitalization, patient had a second-degree AV block which was felt to be asymptomatic.  However recent syncope episode quite concerning.  2. PAF: Continue Eliquis and metoprolol.  3. Hypertension: Continue current blood pressure medication  4. History of CVA: Stable  5. Carotid artery disease: Recent carotid ultrasound demonstrated 40 to 59% right ICA stenosis, 60 to 79% left ICA stenosis   Medication Adjustments/Labs and Tests Ordered: Current medicines are reviewed at length with the patient today.  Concerns regarding medicines are outlined above.  Orders Placed This Encounter  Procedures  . CARDIAC EVENT MONITOR   No orders of the defined types were placed in this encounter.   Patient Instructions  Medication Instructions:  Continue current medications  *If you need a refill on your cardiac medications before your next appointment, please call your pharmacy*  Lab Work: None Ordered  Testing/Procedures: Your physician has recommended that you wear an event monitor. Event monitors are medical devices that record the heart's electrical activity. Doctors most often [place] these monitors to diagnose arrhythmias. Arrhythmias are problems with the speed or rhythm of the heartbeat. The monitor is a small, portable device. You can wear one while you do your normal daily activities. This is usually used to diagnose what is causing palpitations/syncope (passing out).  Follow-Up: At [facility], you and your health needs are our priority.  As part of our continuing mission to provide you with exceptional heart care, we have created designated Provider Care Teams.  These Care Teams include your primary Cardiologist (physician) and Advanced Practice Providers (APPs -  Physician Assistants and Nurse Practitioners) who all work together to provide you with the care you need, when you need it.  Your next appointment:   2  month(s)  The format for your next appointment:   In Person  Provider:   You may see Quay Burow, MD or one of the following Advanced Practice Providers on your designated Care Team:    Kerin Ransom, PA-C  Sande Rives, PA-C  Coletta Memos, FNP        Signed, Almyra Deforest, [place]  11/19/2019 11:24 PM    [place]

## 2019-11-17 NOTE — Patient Instructions (Signed)
Medication Instructions:  Continue current medications  *If you need a refill on your cardiac medications before your next appointment, please call your pharmacy*  Lab Work: None Ordered  Testing/Procedures: Your physician has recommended that you wear an event monitor. Event monitors are medical devices that record the heart's electrical activity. Doctors most often [place] these monitors to diagnose arrhythmias. Arrhythmias are problems with the speed or rhythm of the heartbeat. The monitor is a small, portable device. You can wear one while you do your normal daily activities. This is usually used to diagnose what is causing palpitations/syncope (passing out).  Follow-Up: At [facility], you and your health needs are our priority.  As part of our continuing mission to provide you with exceptional heart care, we have created designated Provider Care Teams.  These Care Teams include your primary Cardiologist (physician) and Advanced Practice Providers (APPs -  Physician Assistants and Nurse Practitioners) who all work together to provide you with the care you need, when you need it.  Your next appointment:   2 month(s)  The format for your next appointment:   In Person  Provider:   You may see Quay Burow, MD or one of the following Advanced Practice Providers on your designated Care Team:    Kerin Ransom, PA-C  [place], [place]  Coletta Memos, [place]

## 2019-11-18 ENCOUNTER — Telehealth: Payer: Self-pay | Admitting: Radiology

## 2019-11-18 NOTE — Telephone Encounter (Signed)
Enrolled patient for a 30 day [employer] Event monitor to be mailed to patients rehab facility.  Nicole Kindred for [facility]  [place] [address] [phone]

## 2019-11-19 ENCOUNTER — Encounter: Payer: Self-pay | Admitting: Physician Assistant

## 2019-11-19 ENCOUNTER — Ambulatory Visit: Payer: Medicare PPO | Admitting: Neurology

## 2019-11-23 ENCOUNTER — Other Ambulatory Visit (INDEPENDENT_AMBULATORY_CARE_PROVIDER_SITE_OTHER): Payer: Medicare PPO

## 2019-11-23 DIAGNOSIS — I10 Essential (primary) hypertension: Secondary | ICD-10-CM

## 2019-11-27 ENCOUNTER — Emergency Department (INDEPENDENT_AMBULATORY_CARE_PROVIDER_SITE_OTHER): Payer: Medicare PPO

## 2019-11-27 DIAGNOSIS — R55 Syncope and collapse: Secondary | ICD-10-CM | POA: Diagnosis not present

## 2019-12-04 ENCOUNTER — Other Ambulatory Visit (HOSPITAL_COMMUNITY): Payer: Self-pay | Admitting: Specialist

## 2019-12-04 DIAGNOSIS — I6930 Unspecified sequelae of cerebral infarction: Secondary | ICD-10-CM

## 2019-12-04 DIAGNOSIS — R1312 Dysphagia, oropharyngeal phase: Secondary | ICD-10-CM

## 2019-12-07 ENCOUNTER — Encounter (HOSPITAL_COMMUNITY): Payer: Medicare PPO | Admitting: Speech Pathology

## 2019-12-09 ENCOUNTER — Emergency Department (HOSPITAL_COMMUNITY): Payer: Medicare PPO

## 2019-12-09 ENCOUNTER — Inpatient Hospital Stay (HOSPITAL_COMMUNITY)
Admission: EM | Admit: 2019-12-09 | Discharge: 2019-12-21 | DRG: 189 | Disposition: A | Discharge status: Skilled Nursing Facility | Payer: Medicare PPO | Source: Skilled Nursing Facility | Attending: Internal Medicine | Admitting: Internal Medicine

## 2019-12-09 ENCOUNTER — Observation Stay (HOSPITAL_BASED_OUTPATIENT_CLINIC_OR_DEPARTMENT_OTHER): Payer: Medicare PPO

## 2019-12-09 ENCOUNTER — Encounter (HOSPITAL_COMMUNITY): Payer: Self-pay

## 2019-12-09 ENCOUNTER — Other Ambulatory Visit: Payer: Self-pay

## 2019-12-09 DIAGNOSIS — I252 Old myocardial infarction: Secondary | ICD-10-CM

## 2019-12-09 DIAGNOSIS — I340 Nonrheumatic mitral (valve) insufficiency: Secondary | ICD-10-CM

## 2019-12-09 DIAGNOSIS — I214 Non-ST elevation (NSTEMI) myocardial infarction: Secondary | ICD-10-CM

## 2019-12-09 DIAGNOSIS — N183 Chronic kidney disease, stage 3 unspecified: Secondary | ICD-10-CM | POA: Diagnosis present

## 2019-12-09 DIAGNOSIS — Z66 Do not resuscitate: Secondary | ICD-10-CM | POA: Diagnosis present

## 2019-12-09 DIAGNOSIS — Z79899 Other long term (current) drug therapy: Secondary | ICD-10-CM

## 2019-12-09 DIAGNOSIS — I6523 Occlusion and stenosis of bilateral carotid arteries: Secondary | ICD-10-CM | POA: Diagnosis present

## 2019-12-09 DIAGNOSIS — F17210 Nicotine dependence, cigarettes, uncomplicated: Secondary | ICD-10-CM | POA: Diagnosis present

## 2019-12-09 DIAGNOSIS — Z7982 Long term (current) use of aspirin: Secondary | ICD-10-CM | POA: Diagnosis not present

## 2019-12-09 DIAGNOSIS — I480 Paroxysmal atrial fibrillation: Secondary | ICD-10-CM | POA: Diagnosis present

## 2019-12-09 DIAGNOSIS — E785 Hyperlipidemia, unspecified: Secondary | ICD-10-CM | POA: Diagnosis present

## 2019-12-09 DIAGNOSIS — Z7902 Long term (current) use of antithrombotics/antiplatelets: Secondary | ICD-10-CM | POA: Diagnosis not present

## 2019-12-09 DIAGNOSIS — Z720 Tobacco use: Secondary | ICD-10-CM | POA: Diagnosis not present

## 2019-12-09 DIAGNOSIS — R195 Other fecal abnormalities: Secondary | ICD-10-CM | POA: Diagnosis present

## 2019-12-09 DIAGNOSIS — N179 Acute kidney failure, unspecified: Secondary | ICD-10-CM | POA: Diagnosis present

## 2019-12-09 DIAGNOSIS — E876 Hypokalemia: Secondary | ICD-10-CM | POA: Diagnosis not present

## 2019-12-09 DIAGNOSIS — I69354 Hemiplegia and hemiparesis following cerebral infarction affecting left non-dominant side: Secondary | ICD-10-CM

## 2019-12-09 DIAGNOSIS — J189 Pneumonia, unspecified organism: Secondary | ICD-10-CM | POA: Diagnosis present

## 2019-12-09 DIAGNOSIS — R778 Other specified abnormalities of plasma proteins: Secondary | ICD-10-CM | POA: Diagnosis not present

## 2019-12-09 DIAGNOSIS — J9601 Acute respiratory failure with hypoxia: Principal | ICD-10-CM | POA: Diagnosis present

## 2019-12-09 DIAGNOSIS — R0600 Dyspnea, unspecified: Secondary | ICD-10-CM | POA: Diagnosis not present

## 2019-12-09 DIAGNOSIS — Z8249 Family history of ischemic heart disease and other diseases of the circulatory system: Secondary | ICD-10-CM | POA: Diagnosis not present

## 2019-12-09 DIAGNOSIS — J440 Chronic obstructive pulmonary disease with acute lower respiratory infection: Secondary | ICD-10-CM | POA: Diagnosis present

## 2019-12-09 DIAGNOSIS — I361 Nonrheumatic tricuspid (valve) insufficiency: Secondary | ICD-10-CM | POA: Diagnosis not present

## 2019-12-09 DIAGNOSIS — I4891 Unspecified atrial fibrillation: Secondary | ICD-10-CM | POA: Diagnosis not present

## 2019-12-09 DIAGNOSIS — U071 COVID-19: Secondary | ICD-10-CM | POA: Diagnosis present

## 2019-12-09 DIAGNOSIS — J069 Acute upper respiratory infection, unspecified: Secondary | ICD-10-CM | POA: Diagnosis not present

## 2019-12-09 DIAGNOSIS — N1831 Chronic kidney disease, stage 3a: Secondary | ICD-10-CM | POA: Diagnosis present

## 2019-12-09 DIAGNOSIS — I5023 Acute on chronic systolic (congestive) heart failure: Secondary | ICD-10-CM | POA: Diagnosis present

## 2019-12-09 DIAGNOSIS — I441 Atrioventricular block, second degree: Secondary | ICD-10-CM | POA: Diagnosis present

## 2019-12-09 DIAGNOSIS — D631 Anemia in chronic kidney disease: Secondary | ICD-10-CM | POA: Diagnosis present

## 2019-12-09 DIAGNOSIS — R9430 Abnormal result of cardiovascular function study, unspecified: Secondary | ICD-10-CM | POA: Diagnosis not present

## 2019-12-09 DIAGNOSIS — I10 Essential (primary) hypertension: Secondary | ICD-10-CM | POA: Diagnosis present

## 2019-12-09 DIAGNOSIS — I5043 Acute on chronic combined systolic (congestive) and diastolic (congestive) heart failure: Secondary | ICD-10-CM | POA: Diagnosis present

## 2019-12-09 DIAGNOSIS — I130 Hypertensive heart and chronic kidney disease with heart failure and stage 1 through stage 4 chronic kidney disease, or unspecified chronic kidney disease: Secondary | ICD-10-CM | POA: Diagnosis present

## 2019-12-09 DIAGNOSIS — R0602 Shortness of breath: Secondary | ICD-10-CM | POA: Diagnosis not present

## 2019-12-09 DIAGNOSIS — D649 Anemia, unspecified: Secondary | ICD-10-CM | POA: Diagnosis present

## 2019-12-09 DIAGNOSIS — I272 Pulmonary hypertension, unspecified: Secondary | ICD-10-CM | POA: Diagnosis present

## 2019-12-09 HISTORY — DX: Hypo-osmolality and hyponatremia: E87.1

## 2019-12-09 HISTORY — DX: Respiratory failure, unspecified, unspecified whether with hypoxia or hypercapnia: J96.90

## 2019-12-09 HISTORY — DX: Unspecified atrial fibrillation: I48.91

## 2019-12-09 HISTORY — DX: COVID-19: U07.1

## 2019-12-09 HISTORY — DX: Dysphagia, unspecified: R13.10

## 2019-12-09 HISTORY — DX: Unspecified kidney failure: N19

## 2019-12-09 LAB — CBC WITH DIFFERENTIAL/PLATELET
Abs Immature Granulocytes: 0.02 10*3/uL (ref 0.00–0.07)
Basophils Absolute: 0.1 10*3/uL (ref 0.0–0.1)
Basophils Relative: 1 %
Eosinophils Absolute: 0.1 10*3/uL (ref 0.0–0.5)
Eosinophils Relative: 1 %
HCT: 27.6 % — ABNORMAL LOW (ref 39.0–52.0)
Hemoglobin: 8.8 g/dL — ABNORMAL LOW (ref 13.0–17.0)
Immature Granulocytes: 0 %
Lymphocytes Relative: 20 %
Lymphs Abs: 1.6 10*3/uL (ref 0.7–4.0)
MCH: 31.5 pg (ref 26.0–34.0)
MCHC: 31.9 g/dL (ref 30.0–36.0)
MCV: 98.9 fL (ref 80.0–100.0)
Monocytes Absolute: 0.6 10*3/uL (ref 0.1–1.0)
Monocytes Relative: 8 %
Neutro Abs: 5.5 10*3/uL (ref 1.7–7.7)
Neutrophils Relative %: 70 %
Platelets: 456 10*3/uL — ABNORMAL HIGH (ref 150–400)
RBC: 2.79 MIL/uL — ABNORMAL LOW (ref 4.22–5.81)
RDW: 14.8 % (ref 11.5–15.5)
WBC: 7.9 10*3/uL (ref 4.0–10.5)
nRBC: 0 % (ref 0.0–0.2)

## 2019-12-09 LAB — TROPONIN I (HIGH SENSITIVITY)
Troponin I (High Sensitivity): 4269 ng/L (ref <18)
Troponin I (High Sensitivity): 4848 ng/L (ref <18)

## 2019-12-09 LAB — PROTIME-INR: Prothrombin Time: <10 seconds — ABNORMAL LOW (ref 11.4–15.2)

## 2019-12-09 LAB — COMPREHENSIVE METABOLIC PANEL
ALT: 13 U/L (ref 0–44)
AST: 31 U/L (ref 15–41)
Albumin: 2.9 g/dL — ABNORMAL LOW (ref 3.5–5.0)
Alkaline Phosphatase: 76 U/L (ref 38–126)
Anion gap: 9 (ref 5–15)
BUN: 32 mg/dL — ABNORMAL HIGH (ref 8–23)
CO2: 20 mmol/L — ABNORMAL LOW (ref 22–32)
Calcium: 9.6 mg/dL (ref 8.9–10.3)
Chloride: 104 mmol/L (ref 98–111)
Creatinine, Ser: 2.05 mg/dL — ABNORMAL HIGH (ref 0.61–1.24)
GFR calc Af Amer: 37 mL/min — ABNORMAL LOW (ref >60)
GFR calc non Af Amer: 32 mL/min — ABNORMAL LOW (ref >60)
Glucose, Bld: 107 mg/dL — ABNORMAL HIGH (ref 70–99)
Potassium: 4.1 mmol/L (ref 3.5–5.1)
Sodium: 133 mmol/L — ABNORMAL LOW (ref 135–145)
Total Bilirubin: 0.4 mg/dL (ref 0.3–1.2)
Total Protein: 6.6 g/dL (ref 6.5–8.1)

## 2019-12-09 LAB — RESPIRATORY PANEL BY RT PCR (FLU A&B, COVID)
Influenza A by PCR: NEGATIVE
Influenza B by PCR: NEGATIVE
SARS Coronavirus 2 by RT PCR: NEGATIVE

## 2019-12-09 LAB — APTT: aPTT: <20 seconds — ABNORMAL LOW (ref 24–36)

## 2019-12-09 LAB — POC SARS CORONAVIRUS 2 AG -  ED: SARS Coronavirus 2 Ag: NEGATIVE

## 2019-12-09 LAB — HEPARIN LEVEL (UNFRACTIONATED): Heparin Unfractionated: >2.2 IU/mL — ABNORMAL HIGH (ref 0.30–0.70)

## 2019-12-09 LAB — ECHOCARDIOGRAM COMPLETE
Height: 63 in
Weight: 2000 oz

## 2019-12-09 LAB — BRAIN NATRIURETIC PEPTIDE: B Natriuretic Peptide: 3459 pg/mL — ABNORMAL HIGH (ref 0.0–100.0)

## 2019-12-09 MED ORDER — SODIUM CHLORIDE 0.9% FLUSH: 3.0000 mL | INTRAVENOUS | Status: DC | PRN

## 2019-12-09 MED ORDER — TRAZODONE HCL 50 MG PO TABS
50.0000 mg | ORAL_TABLET | Freq: Every evening | ORAL | Status: DC | PRN
  Filled 2019-12-09: qty 1

## 2019-12-09 MED ORDER — ONDANSETRON HCL 4 MG PO TABS: 4.0000 mg | ORAL_TABLET | Freq: Four times a day (QID) | ORAL | Status: DC | PRN

## 2019-12-09 MED ORDER — SODIUM CHLORIDE 0.9 % IV SOLN
1.0000 g | Freq: Once | INTRAVENOUS | Status: AC
  Administered 2019-12-09: 14:00:00 1 g via INTRAVENOUS
  Filled 2019-12-09: qty 10

## 2019-12-09 MED ORDER — ACETAMINOPHEN 650 MG RE SUPP: 650.0000 mg | Freq: Four times a day (QID) | RECTAL | Status: DC | PRN

## 2019-12-09 MED ORDER — SODIUM BICARBONATE 650 MG PO TABS
650.0000 mg | ORAL_TABLET | Freq: Two times a day (BID) | ORAL | Status: DC
  Administered 2019-12-09 – 2019-12-21 (×24): 650 mg via ORAL
  Filled 2019-12-09 (×24): qty 1

## 2019-12-09 MED ORDER — FUROSEMIDE 10 MG/ML IJ SOLN
40.0000 mg | Freq: Two times a day (BID) | INTRAMUSCULAR | Status: DC
  Administered 2019-12-09 – 2019-12-11 (×4): 40 mg via INTRAVENOUS
  Filled 2019-12-09 (×4): qty 4

## 2019-12-09 MED ORDER — SODIUM CHLORIDE 0.9% FLUSH
3.0000 mL | Freq: Two times a day (BID) | INTRAVENOUS | Status: DC
  Administered 2019-12-09 – 2019-12-21 (×13): 3 mL via INTRAVENOUS

## 2019-12-09 MED ORDER — SODIUM CHLORIDE 0.9 % IV SOLN
1.0000 g | INTRAVENOUS | Status: DC
  Administered 2019-12-10 – 2019-12-15 (×6): 1 g via INTRAVENOUS
  Filled 2019-12-09 (×7): qty 10

## 2019-12-09 MED ORDER — ONDANSETRON HCL 4 MG/2ML IJ SOLN
4.0000 mg | Freq: Four times a day (QID) | INTRAMUSCULAR | Status: DC | PRN
  Administered 2019-12-15: 4 mg via INTRAVENOUS
  Filled 2019-12-09: qty 2

## 2019-12-09 MED ORDER — ALBUTEROL SULFATE HFA 108 (90 BASE) MCG/ACT IN AERS
4.0000 | INHALATION_SPRAY | Freq: Once | RESPIRATORY_TRACT | Status: AC
  Administered 2019-12-09: 4 via RESPIRATORY_TRACT
  Filled 2019-12-09: qty 6.7

## 2019-12-09 MED ORDER — ASPIRIN EC 81 MG PO TBEC
81.0000 mg | DELAYED_RELEASE_TABLET | Freq: Every day | ORAL | Status: DC
  Administered 2019-12-10 – 2019-12-21 (×12): 81 mg via ORAL
  Filled 2019-12-09 (×12): qty 1

## 2019-12-09 MED ORDER — ROSUVASTATIN CALCIUM 20 MG PO TABS
20.0000 mg | ORAL_TABLET | Freq: Every evening | ORAL | Status: DC
  Administered 2019-12-09 – 2019-12-13 (×5): 20 mg via ORAL
  Filled 2019-12-09 (×5): qty 1

## 2019-12-09 MED ORDER — IPRATROPIUM BROMIDE HFA 17 MCG/ACT IN AERS: 2.0000 | INHALATION_SPRAY | Freq: Once | RESPIRATORY_TRACT | Status: DC

## 2019-12-09 MED ORDER — FUROSEMIDE 10 MG/ML IJ SOLN
60.0000 mg | Freq: Once | INTRAMUSCULAR | Status: AC
  Administered 2019-12-09: 60 mg via INTRAVENOUS
  Filled 2019-12-09: qty 6

## 2019-12-09 MED ORDER — VITAMIN D 25 MCG (1000 UNIT) PO TABS
1000.0000 [IU] | ORAL_TABLET | Freq: Every day | ORAL | Status: DC
  Administered 2019-12-10 – 2019-12-21 (×12): 1000 [IU] via ORAL
  Filled 2019-12-09 (×12): qty 1

## 2019-12-09 MED ORDER — FAMOTIDINE 20 MG PO TABS
20.0000 mg | ORAL_TABLET | Freq: Every day | ORAL | Status: DC
  Administered 2019-12-10 – 2019-12-13 (×4): 20 mg via ORAL
  Filled 2019-12-09 (×5): qty 1

## 2019-12-09 MED ORDER — CARVEDILOL 3.125 MG PO TABS
3.1250 mg | ORAL_TABLET | Freq: Two times a day (BID) | ORAL | Status: DC
  Administered 2019-12-09 – 2019-12-21 (×24): 3.125 mg via ORAL
  Filled 2019-12-09 (×24): qty 1

## 2019-12-09 MED ORDER — SODIUM CHLORIDE 0.9 % IV SOLN: 250.0000 mL | INTRAVENOUS | Status: DC | PRN

## 2019-12-09 MED ORDER — ALBUTEROL SULFATE (2.5 MG/3ML) 0.083% IN NEBU
2.5000 mg | INHALATION_SOLUTION | RESPIRATORY_TRACT | Status: DC | PRN
  Administered 2019-12-10: 2.5 mg via RESPIRATORY_TRACT
  Filled 2019-12-09: qty 3

## 2019-12-09 MED ORDER — HEPARIN (PORCINE) 25000 UT/250ML-% IV SOLN
800.0000 [IU]/h | INTRAVENOUS | Status: DC
  Administered 2019-12-09: 850 [IU]/h via INTRAVENOUS
  Administered 2019-12-10: 1000 [IU]/h via INTRAVENOUS
  Administered 2019-12-12 – 2019-12-13 (×2): 900 [IU]/h via INTRAVENOUS
  Filled 2019-12-09 (×4): qty 250

## 2019-12-09 MED ORDER — DOXYCYCLINE HYCLATE 100 MG PO TABS
100.0000 mg | ORAL_TABLET | Freq: Two times a day (BID) | ORAL | Status: AC
  Administered 2019-12-09 – 2019-12-16 (×14): 100 mg via ORAL
  Filled 2019-12-09 (×14): qty 1

## 2019-12-09 MED ORDER — LORATADINE 10 MG PO TABS
10.0000 mg | ORAL_TABLET | Freq: Every day | ORAL | Status: DC
  Administered 2019-12-10 – 2019-12-21 (×12): 10 mg via ORAL
  Filled 2019-12-09 (×12): qty 1

## 2019-12-09 MED ORDER — ACETAMINOPHEN 325 MG PO TABS
650.0000 mg | ORAL_TABLET | Freq: Three times a day (TID) | ORAL | Status: DC
  Administered 2019-12-09 – 2019-12-21 (×35): 650 mg via ORAL
  Filled 2019-12-09 (×35): qty 2

## 2019-12-09 MED ORDER — SODIUM CHLORIDE 0.9 % IV SOLN
500.0000 mg | Freq: Once | INTRAVENOUS | Status: AC
  Administered 2019-12-09: 15:00:00 500 mg via INTRAVENOUS
  Filled 2019-12-09: qty 500

## 2019-12-09 MED ORDER — ACETAMINOPHEN 325 MG PO TABS: 650.0000 mg | ORAL_TABLET | Freq: Four times a day (QID) | ORAL | Status: DC | PRN

## 2019-12-09 MED ORDER — PREDNISONE 50 MG PO TABS
60.0000 mg | ORAL_TABLET | Freq: Once | ORAL | Status: AC
  Administered 2019-12-09: 60 mg via ORAL
  Filled 2019-12-09: qty 1

## 2019-12-09 MED ORDER — ALBUTEROL SULFATE HFA 108 (90 BASE) MCG/ACT IN AERS: 2.0000 | INHALATION_SPRAY | RESPIRATORY_TRACT | Status: DC | PRN

## 2019-12-09 MED ORDER — POLYETHYLENE GLYCOL 3350 17 G PO PACK
17.0000 g | PACK | Freq: Every day | ORAL | Status: DC | PRN
  Administered 2019-12-10 – 2019-12-19 (×3): 17 g via ORAL
  Filled 2019-12-09 (×3): qty 1

## 2019-12-09 MED ORDER — PAROXETINE HCL 20 MG PO TABS
20.0000 mg | ORAL_TABLET | Freq: Every day | ORAL | Status: DC
  Administered 2019-12-10 – 2019-12-21 (×12): 20 mg via ORAL
  Filled 2019-12-09 (×12): qty 1

## 2019-12-09 MED ORDER — GUAIFENESIN ER 600 MG PO TB12
600.0000 mg | ORAL_TABLET | Freq: Two times a day (BID) | ORAL | Status: DC
  Administered 2019-12-10 – 2019-12-21 (×22): 600 mg via ORAL
  Filled 2019-12-09 (×26): qty 1

## 2019-12-09 NOTE — Plan of Care (Signed)

## 2019-12-09 NOTE — ED Triage Notes (Signed)
EMS reports pt is a residen at [facility].  Reports sob and cp since last night.  EMS arrived and pt's room air o2 sat was 92%.  EMS reports absent lung sounds in r lower  Lobe, diminished in upper r lobe, and rhonchi throught left side.  Pt alert and oriented.  Denies any pain.   EMS placed pt on o2 at 4 liters and sat stayed around 92%.

## 2019-12-09 NOTE — ED Provider Notes (Signed)
[facility] EMERGENCY DEPARTMENT Provider Note   CSN: [id number] Arrival date & time: 12/09/19  1039     History Chief Complaint  Patient presents with  . Shortness of Breath    Anthony Andrews is a 74 y.o. male with PMH significant for respiratory failure, tobacco use, COPD, CVA, AKI, HTN, and HLD who presents to the ED via EMS from [facility] for a 1 day history of cough, shortness of breath, chest pain.  Patient is also endorsing wheezing and intermittent chills.  When EMS arrived, patient's O2 sat was 92% on RA and he was subsequently placed on 4 L supplemental O2.  On my examination, patient is resting comfortably in the bed, however there are audible lung sounds.  He attributes his substernal chest discomfort to his cough and states that it is worse with coughing.  No exertional component.  He denies any inability to breathe, sore throat, headache or dizziness, abdominal pain, nausea or vomiting, or other symptoms.  HPI     Past Medical History:  Diagnosis Date  . Acute metabolic encephalopathy   . AKI (acute kidney injury) ([place])   . Atrial fibrillation ([place])   . Chronic hyponatremia   . COVID-19   . Dysphagia   . Essential hypertension   . Hyponatremia   . Kidney failure   . Respiratory failure ([place])   . Rhabdomyolysis   . Stroke ([place])   . Tobacco use     Patient Active Problem List   Diagnosis Date Noted  . Pneumonia 12/09/2019  . Acute respiratory failure with hypoxia ([place]) 09/15/2019  . Atrial fibrillation ([place]) 09/15/2019  . AKI (acute kidney injury) ([place]) 09/12/2019  . Acute metabolic encephalopathy 78/46/9629  . Chronic hyponatremia 09/12/2019  . Metabolic acidosis 52/84/1324  . Rhabdomyolysis 09/11/2019    Past Surgical History:  Procedure Laterality Date  . BACK SURGERY    . SHOULDER SURGERY         Family History  Problem Relation Age of Onset  . Heart attack Mother   . Heart attack Father     Social History   Tobacco Use  . Smoking  status: Current Every Day Smoker    Types: Cigarettes  . Smokeless tobacco: Former Systems developer  . Tobacco comment: 3 cigs/per day  Substance Use Topics  . Alcohol use: Not Currently  . Drug use: Never    Home Medications Prior to Admission medications   Medication Sig Start Date End Date Taking? Authorizing Provider  acetaminophen (TYLENOL) 325 MG tablet Take 650 mg by mouth 3 (three) times daily.   Yes [provider]  apixaban (ELIQUIS) 5 MG TABS tablet Take 1 tablet (5 mg total) by mouth 2 (two) times daily. 09/21/19 12/09/19 Yes Shah, Pratik D, DO  aspirin EC 81 MG tablet Take 81 mg by mouth every morning.   Yes [provider]  cholecalciferol (VITAMIN D3) 25 MCG (1000 UT) tablet Take 1,000 Units by mouth daily.   Yes [provider]  famotidine (PEPCID) 20 MG tablet Take 20 mg by mouth daily.   Yes [provider]  loratadine (CLARITIN) 10 MG tablet Take 10 mg by mouth daily.   Yes [provider]  metoprolol tartrate (LOPRESSOR) 25 MG tablet Take 0.5 tablets (12.5 mg total) by mouth daily. 09/21/19 12/09/19 Yes Shah, Pratik D, DO  PARoxetine (PAXIL) 20 MG tablet Take 20 mg by mouth daily.    Yes [provider]  rosuvastatin (CRESTOR) 10 MG tablet Take 10 mg by  mouth at bedtime.   Yes [provider]  sodium bicarbonate 650 MG tablet Take 650 mg by mouth daily.   Yes [provider]  tamsulosin (FLOMAX) 0.4 MG CAPS capsule Take 0.4 mg by mouth daily.   Yes [provider]  albuterol (VENTOLIN HFA) 108 (90 Base) MCG/ACT inhaler Inhale 2 puffs into the lungs every 4 (four) hours as needed for wheezing or shortness of breath.  05/27/19   [provider]    Allergies    Patient has no known allergies.  Review of Systems   Review of Systems  All other systems reviewed and are negative.   Physical Exam Updated Vital Signs BP 132/87 (BP Location: Left Arm)   Pulse 91   Temp 97.6 F (36.4 C) (Oral)    Resp 20   Ht 5\' 3"  (1.6 m)   Wt 56.5 kg   SpO2 98%   BMI 22.06 kg/m   Physical Exam Vitals and nursing note reviewed. Exam conducted with a chaperone present.  Constitutional:      Appearance: He is ill-appearing.  HENT:     Head: Normocephalic and atraumatic.  Eyes:     General: No scleral icterus.    Conjunctiva/sclera: Conjunctivae normal.  Neck:     Vascular: JVD present.  Cardiovascular:     Rate and Rhythm: Normal rate and regular rhythm.     Pulses: Normal pulses.     Heart sounds: Normal heart sounds.  Pulmonary:     Comments: Mildly increased respiratory effort.  Significant expiratory wheezing and rales auscultated bilaterally.  No accessory muscle use.  Mildly tachypneic.  On 4 L supplemental O2. Abdominal:     General: Abdomen is flat. There is no distension.     Palpations: Abdomen is soft.     Tenderness: There is no abdominal tenderness. There is no guarding.  Musculoskeletal:     Comments: No lower extremity pitting edema.   Skin:    General: Skin is dry.     Capillary Refill: Capillary refill takes less than 2 seconds.  Neurological:     Mental Status: He is alert and oriented to person, place, and time.     GCS: GCS eye subscore is 4. GCS verbal subscore is 5. GCS motor subscore is 6.  Psychiatric:        Mood and Affect: Mood normal.        Behavior: Behavior normal.        Thought Content: Thought content normal.      ED Results / Procedures / Treatments   Labs (all labs ordered are listed, but only abnormal results are displayed) Labs Reviewed  CBC WITH DIFFERENTIAL/PLATELET - Abnormal; Notable for the following components:      Result Value   RBC 2.79 (*)    Hemoglobin 8.8 (*)    HCT 27.6 (*)    Platelets 456 (*)    All other components within normal limits  COMPREHENSIVE METABOLIC PANEL - Abnormal; Notable for the following components:   Sodium 133 (*)    CO2 20 (*)    Glucose, Bld 107 (*)    BUN 32 (*)    Creatinine, Ser 2.05 (*)     Albumin 2.9 (*)    GFR calc non Af Amer 32 (*)    GFR calc Af Amer 37 (*)    All other components within normal limits  BRAIN NATRIURETIC PEPTIDE - Abnormal; Notable for the following components:   B Natriuretic Peptide 3,459.0 (*)  All other components within normal limits  TROPONIN I (HIGH SENSITIVITY) - Abnormal; Notable for the following components:   Troponin I (High Sensitivity) 4,269 (*)    All other components within normal limits  TROPONIN I (HIGH SENSITIVITY) - Abnormal; Notable for the following components:   Troponin I (High Sensitivity) 4,848 (*)    All other components within normal limits  RESPIRATORY PANEL BY RT PCR (FLU A&B, COVID)  SARS CORONAVIRUS 2 (TAT 6-24 HRS)  OCCULT BLOOD X 1 CARD TO LAB, STOOL  POC SARS CORONAVIRUS 2 AG -  ED    EKG EKG Interpretation  Date/Time:  Wednesday December 09 2019 12:23:31 EDT Ventricular Rate:  85 PR Interval:    QRS Duration: 96 QT Interval:  400 QTC Calculation: 476 R Axis:   82 Text Interpretation: Sinus rhythm Borderline right axis deviation ST depression, consider ischemia, lateral lds Borderline prolonged QT interval Confirmed by Nat Christen [phone]) on 12/09/2019 1:08:50 PM   Radiology DG Chest Portable 1 View  Result Date: 12/09/2019 CLINICAL DATA:  EMS reports pt is a residen at [facility]. Reports sob and cp since last night. Per Pts chart Pt has had COVID - unsure when. Hx A-fib, stroke, smoker EXAM: PORTABLE CHEST 1 VIEW COMPARISON:  09/15/2019 FINDINGS: Lungs density interstitial hazy airspace lung opacities, with more confluent opacity in the right lung base, obscuring the hemidiaphragm. Cardiac silhouette is normal in size.  Mediastinal hilar masses. Probable pleural effusions, larger on the right.  No pneumothorax. IMPRESSION: 1. Interstitial and hazy airspace lung opacities bilaterally with more confluent opacity noted in the right lung base. Findings may reflect multifocal pneumonia or pulmonary edema. Suspect  right lung base opacity due to a combination of pleural fluid with either atelectasis or more confluent infection. Electronically Signed   By: Lajean Manes M.D.   On: 12/09/2019 11:34   ECHOCARDIOGRAM COMPLETE  Result Date: 12/09/2019    ECHOCARDIOGRAM REPORT   Patient Name:   Anthony Andrews Date of Exam: 12/09/2019 Medical Rec #:  [id number]   Height:       63.0 in Accession #:    [id number]  Weight:       125.0 lb Date of Birth:  1945-11-23  BSA:          1.584 m Patient Age:    67 years    BP:           140/78 mmHg Patient Gender: M           HR:           90 bpm. Exam Location:  [facility] Procedure: 2D Echo Indications:    Dyspnea 786.09 / R06.00  History:        Patient has prior history of Echocardiogram examinations, most                 recent 09/17/2019. Stroke, Arrythmias:Atrial Fibrillation; Risk                 Factors:Current Smoker. Rhabdomyolysis, Pneumonia.  Sonographer:    Leavy Cella RDCS (AE) Referring Phys: [id number] COURAGE EMOKPAE IMPRESSIONS  1. Left ventricular ejection fraction, by estimation, is 40%. The left ventricle has moderately decreased function. The left ventricle demonstrates global hypokinesis. Left ventricular diastolic parameters are consistent with Grade II diastolic dysfunction (pseudonormalization).  2. Right ventricular systolic function is normal. The right ventricular size is normal. There is moderately elevated pulmonary artery systolic pressure.  3. The mitral valve is grossly normal. Mild mitral valve  regurgitation.  4. Morphologically, there appears to be mild aortic stenosis. The aortic valve is tricuspid. Aortic valve regurgitation is not visualized.  5. The inferior vena cava is normal in size with greater than 50% respiratory variability, suggesting right atrial pressure of 3 mmHg. FINDINGS  Left Ventricle: Left ventricular ejection fraction, by estimation, is 40%. The left ventricle has moderately decreased function. The left ventricle demonstrates global  hypokinesis. The left ventricular internal cavity size was normal in size. There is no left ventricular hypertrophy. Left ventricular diastolic parameters are consistent with Grade II diastolic dysfunction (pseudonormalization). Indeterminate filling pressures. Right Ventricle: The right ventricular size is normal. No increase in right ventricular wall thickness. Right ventricular systolic function is normal. There is moderately elevated pulmonary artery systolic pressure. The tricuspid regurgitant velocity is 3.08 m/s, and with an assumed right atrial pressure of 10 mmHg, the estimated right ventricular systolic pressure is 47.6 mmHg. Left Atrium: Left atrial size was normal in size. Right Atrium: Right atrial size was normal in size. Pericardium: There is no evidence of pericardial effusion. Mitral Valve: The mitral valve is grossly normal. Mild mitral valve regurgitation. Tricuspid Valve: The tricuspid valve is grossly normal. Tricuspid valve regurgitation is mild. Aortic Valve: Morphologically, there appears to be mild aortic stenosis. The aortic valve is tricuspid. . There is mild thickening and mild calcification of the aortic valve. Aortic valve regurgitation is not visualized. Mild aortic valve annular calcification. There is mild thickening of the aortic valve. There is mild calcification of the aortic valve. Pulmonic Valve: The pulmonic valve was grossly normal. Pulmonic valve regurgitation is not visualized. Aorta: The aortic root is normal in size and structure. Venous: The inferior vena cava is normal in size with greater than 50% respiratory variability, suggesting right atrial pressure of 3 mmHg. IAS/Shunts: No atrial level shunt detected by color flow Doppler.  LEFT VENTRICLE PLAX 2D LVIDd:         4.76 cm  Diastology LVIDs:         4.10 cm  LV e' lateral:   6.96 cm/s LV PW:         0.84 cm  LV E/e' lateral: 9.4 LV IVS:        0.79 cm  LV e' medial:    5.98 cm/s LVOT diam:     2.10 cm  LV E/e'  medial:  11.0 LVOT Area:     3.46 cm  RIGHT VENTRICLE RV S prime:     11.60 cm/s TAPSE (M-mode): 1.7 cm LEFT ATRIUM             Index       RIGHT ATRIUM           Index LA diam:        4.00 cm 2.53 cm/m  RA Area:     13.90 cm LA Vol (A2C):   74.9 ml 47.30 ml/m RA Volume:   37.30 ml  23.55 ml/m LA Vol (A4C):   42.6 ml 26.90 ml/m LA Biplane Vol: 56.1 ml 35.43 ml/m   AORTA Ao Root diam: 2.80 cm MITRAL VALVE               TRICUSPID VALVE MV Area (PHT): 2.91 cm    TR Peak grad:   37.9 mmHg MV Decel Time: 261 msec    TR Vmax:        308.00 cm/s MR Peak grad: 42.2 mmHg MR Mean grad: 29.0 mmHg    SHUNTS MR Vmax:  325.00 cm/s  Systemic Diam: 2.10 cm MR Vmean:     254.0 cm/s MV E velocity: 65.50 cm/s MV A velocity: 41.70 cm/s MV E/A ratio:  1.57 Kate Sable MD Electronically signed by Kate Sable MD Signature Date/Time: 12/09/2019/3:20:59 PM    Final     Procedures Procedures (including critical care time)  Medications Ordered in ED Medications  predniSONE (DELTASONE) tablet 60 mg (60 mg Oral Given 12/09/19 1205)  albuterol (VENTOLIN HFA) 108 (90 Base) MCG/ACT inhaler 4 puff (4 puffs Inhalation Given 12/09/19 1337)  cefTRIAXone (ROCEPHIN) 1 g in sodium chloride 0.9 % 100 mL IVPB (0 g Intravenous Stopped 12/09/19 1445)  azithromycin (ZITHROMAX) 500 mg in sodium chloride 0.9 % 250 mL IVPB (0 mg Intravenous Stopped 12/09/19 1554)  furosemide (LASIX) injection 60 mg (60 mg Intravenous Given 12/09/19 1419)    ED Course  I have reviewed the triage vital signs and the nursing notes.  Pertinent labs & imaging results that were available during my care of the patient were reviewed by me and considered in my medical decision making (see chart for details).  Clinical Course as of Dec 08 1657  Wed Dec 09, 2019  1307 Spoke with Dr. Bronson Ing who reviewed patient's EKG and after discussing patient's HPI, suspect that this is a demand ischemia related to his AKI in setting of multifocal pneumonia.    [GG]    Clinical Course User Index [GG] Corena Herter, PA-C   MDM Rules/Calculators/A&P                      Patient is saturating 99% on 4 L supplemental O2 and his vital signs are not optically concerning.  He is in no acute distress on examination.  However, he is found to have a troponin elevated to 4,269 with ST depression in lateral leads 4 through 6.  Discussed with Dr. Lacinda Axon and we will consult cardiology regarding these findings.  However, patient believes that his chest discomfort is attributable to his 1 day history of shortness of breath and cough.  He states that his chest pain is worse with coughing.  He is found to be anemic with a hemoglobin of 8.8, however consistent with patient's baseline.  CMP reveals acute kidney injury with elevated creatinine and decreased GFR when compared to prior labs.  Patient reports mild improvement with albuterol and prednisone, given under suspicion of COPD exacerbation.  However, plain films obtained of chest demonstrates interstitial and hazy airspace opacities bilaterally concerning for a multifocal pneumonia versus pulmonary edema.  We will treat patient with Rocephin and azithromycin for suspected community-acquired pneumonia and obtain 6-24-hour COVID-19 testing as POC was negative.  Spoke with Dr. Bronson Ing who reviewed patient's EKG and after discussing patient's HPI, suspect that this is a demand ischemia related to his AKI in setting of multifocal pneumonia.  He is advising that patient will ultimately require limited echocardiogram to reassess LVEF in addition to a possible nuclear stress test, however these can be performed outpatient following discharge for his pneumonia.  Most recent echo was performed and December 2020 and revealed LVEF 45 to 50%.   We will consult hospitalist for admission in setting of multifocal pneumonia and AKI.    Final Clinical Impression(s) / ED Diagnoses Final diagnoses:  Shortness of breath    Rx / DC  Orders ED Discharge Orders    None       Reita Chard 12/09/19 1659    Nat Christen, MD  12/10/19 0800  

## 2019-12-09 NOTE — Progress Notes (Signed)
*  PRELIMINARY RESULTS* Echocardiogram 2D Echocardiogram has been performed.  Leavy Cella 12/09/2019, 3:14 PM

## 2019-12-09 NOTE — ED Notes (Signed)
Date and time results received: 12/09/19 @ 1150 (use smartphrase ".now" to insert current time)  Test: Troponin  Critical Value: 4269  Name of Provider Notified: Dr Lacinda Axon  Orders Received? Or Actions Taken?: see new orders

## 2019-12-09 NOTE — ED Notes (Signed)
No stool noted for Occult.

## 2019-12-09 NOTE — H&P (Addendum)
Patient Demographics:    Anthony Andrews, is a 74 y.o. male  MRN: [id number]   DOB - June 09, 1946  Admit Date - 12/09/2019  Outpatient Primary MD for the patient is [name], MD   Assessment & Plan:    Principal Problem:   Abnormal result of cardiovascular function study suggestive of non-ST elevation myocardial infarction (NSTEMI) Active Problems:   Acute respiratory failure with hypoxia ([place])   Acute on chronic HFrEF (heart failure with reduced ejection fraction) ([place])   AKI (acute kidney injury) on CKD IIIa   Atrial fibrillation (HCC)   Pneumonia   NSTEMI (non-ST elevated myocardial infarction) ([place])   Tobacco abuse  1)Possible NSTEMI--- patient with dyspnea, EKG with ST depression nonspecific ST and T wave changes, echo with EF down to 40% from 45 to 50% less than 3 months ago on 09/17/2019, echo today also shows global hypokinesis and grade 2 diastolic dysfunction, pulmonary hypertension -Troponin 4,269 >>  4,848  -Stop Eliquis, start IV heparin, -Give Coreg, Crestor and aspirin -Cardiology consult requested to help determine further management and further diagnostic modalities   2)HFrEF--- shortness of breath noted, chest x-ray with possible pulmonary edema and pleural fluid -Repeat echo on 12/09/2018 as noted above #1 -BNP is 3459 -Elevated troponins as above -EKG changes as above -Clinically patient presenting with acute on chronic combined systolic and diastolic dysfunction CHF -IV diuresis as ordered -Monitor daily weights and fluid input and output accurately  3)AKI----acute kidney injury on CKD stage - IIIa-worsening renal function might be due to CHF exacerbation with decreased renal perfusion   creatinine on admission= 2.0 , baseline creatinine = 1.4 (09/21/2019)    ,, renally adjust medications,  avoid nephrotoxic agents / dehydration  / hypotension --Continue sodium bicarb -Monitor renal function closely with IV diuresis for #2 above  4) chronic normocytic and normochromic anemia--- hemoglobin currently 8.8 which is close to patient's baseline -No evidence of ongoing bleeding the patient is on Eliquis and now being switched to IV heparin -Stool occult blood pending  5) history of PAFib/history of second-degree AV block/history of tachyarrhythmia or bradyarrhythmia--- patient had a 30-day event monitor placed on 09/16/2020 --May be able to download information -Continue Coreg, Eliquis stopped due to NSTEMI, IV heparin for now  6) history of COVID-19 infection--- resolved, asymptomatic, repeat Covid test negative at this time  7) history of prior stroke with residual left-sided hemiparesis----anticoagulation as above #1 and #5 -Continue statin  8) possible pneumonia-----no fevers, no leukocytosis, patient with shortness of breath, some cough, chest x-ray findings with pneumonia versus pulmonary edema--- suspect symptoms more likely from #1 and #2 above earlier than frank pneumonia -Treat empirically with Rocephin and doxycycline and as well as mucolytics and bronchodilators for possible pneumonia  9)tobacco abuse--- smoking cessation strongly advised, patient is not ready to quit  10) acute hypoxic respiratory failure--- secondary to #1 and #2 above, continue supplemental oxygen, treat as above #1 and #2  11)Social/Ethics---  patient is a DNR/DNI, he is from [facility] where he has lived for over 6 months now  With History of - Reviewed by me  Past Medical History:  Diagnosis Date  . Acute metabolic encephalopathy   . AKI (acute kidney injury) ([place])   . Atrial fibrillation ([place])   . Chronic hyponatremia   . COVID-19   . Dysphagia   . Essential hypertension   . Hyponatremia   . Kidney failure   . Respiratory failure ([place])   . Rhabdomyolysis   . Stroke ([place])   .  Tobacco use       Past Surgical History:  Procedure Laterality Date  . BACK SURGERY    . SHOULDER SURGERY      Chief Complaint  Patient presents with  . Shortness of Breath      HPI:    Anthony Andrews  is a 74 y.o. male with PMHx of Prior CVA with mild residual left-sided weakness, HTN, ongoing tobacco abuse, PAF on chronic anticoagulation with Eliquis, chronic anemia, recurrent falls syncope and status post 30-day event monitor placed on 09/16/2020 who presents from [facility] with shortness of breath  -No fevers no chills, no vomiting no diarrhea patient does have some cough -Endorses some orthopnea -In ED patient was found to be hypoxic  -CBC without leukocytosis -Chemistry with creatinine of 2.0 up from 1.4 couple months ago, -Chest x-ray with pneumonia versus pulmonary edema -EKG with nonspecific ST and T wave changes and some ST depressions -Troponin 4200 repeat troponin 4800 -BNP over 3400 -EDP discussed case with on-call cardiologist Dr. Bronson Ing  -Echocardiogram shows EF is dropped from 45 to 50% on 1224 2020 to 40% at this time with global hypokinesis and grade 2 diastolic dysfunction as well as pulmonary hypertension  -   Review of systems:    In addition to the HPI above,   A full Review of  Systems was done, all other systems reviewed are negative except as noted above in HPI , .    Social History:  Reviewed by me    Social History   Tobacco Use  . Smoking status: Current Every Day Smoker    Types: Cigarettes  . Smokeless tobacco: Former Systems developer  . Tobacco comment: 3 cigs/per day  Substance Use Topics  . Alcohol use: Not Currently       Family History :  Reviewed by me    Family History  Problem Relation Age of Onset  . Heart attack Mother   . Heart attack Father     Home Medications:   Prior to Admission medications   Medication Sig Start Date End Date Taking? Authorizing Provider  acetaminophen (TYLENOL) 325 MG tablet Take 650 mg by  mouth 3 (three) times daily.   Yes [provider]  apixaban (ELIQUIS) 5 MG TABS tablet Take 1 tablet (5 mg total) by mouth 2 (two) times daily. 09/21/19 12/09/19 Yes Shah, Pratik D, DO  aspirin EC 81 MG tablet Take 81 mg by mouth every morning.   Yes [provider]  cholecalciferol (VITAMIN D3) 25 MCG (1000 UT) tablet Take 1,000 Units by mouth daily.   Yes [provider]  famotidine (PEPCID) 20 MG tablet Take 20 mg by mouth daily.   Yes [provider]  loratadine (CLARITIN) 10 MG tablet Take 10 mg by mouth daily.   Yes [provider]  metoprolol tartrate (LOPRESSOR) 25 MG tablet Take 0.5 tablets (12.5 mg total) by mouth daily. 09/21/19 12/09/19 Yes  Manuella Ghazi, Pratik D, DO  PARoxetine (PAXIL) 20 MG tablet Take 20 mg by mouth daily.    Yes [provider]  rosuvastatin (CRESTOR) 10 MG tablet Take 10 mg by mouth at bedtime.   Yes [provider]  sodium bicarbonate 650 MG tablet Take 650 mg by mouth daily.   Yes [provider]  tamsulosin (FLOMAX) 0.4 MG CAPS capsule Take 0.4 mg by mouth daily.   Yes [provider]  albuterol (VENTOLIN HFA) 108 (90 Base) MCG/ACT inhaler Inhale 2 puffs into the lungs every 4 (four) hours as needed for wheezing or shortness of breath.  05/27/19   [provider]     Allergies:    No Known Allergies   Physical Exam:   Vitals  Blood pressure 132/87, pulse 91, temperature 97.6 F (36.4 C), temperature source Oral, resp. rate 20, height 5\' 3"  (1.6 m), weight 56.5 kg, SpO2 98 %.  Physical Examination: General appearance - alert, well appearing, shortness of breath  mental status - alert, oriented to person, place, and time,  Eyes - sclera anicteric Nose- [place] 2 L/min Neck - supple, no JVD elevation , Chest -diminished in bases with bibasilar rales heart - S1 and S2 normal, regular , event monitor on anterior chest wall Abdomen - soft, nontender, nondistended, no masses or  organomegaly Neurological - screening mental status exam normal, neck supple without rigidity, cranial nerves II through XII intact, DTR's normal and symmetric Extremities - no pedal edema noted, intact peripheral pulses  Skin - warm, dry     Data Review:    CBC Recent Labs  Lab 12/09/19 1115  WBC 7.9  HGB 8.8*  HCT 27.6*  PLT 456*  MCV 98.9  MCH 31.5  MCHC 31.9  RDW 14.8  LYMPHSABS 1.6  MONOABS 0.6  EOSABS 0.1  BASOSABS 0.1   ------------------------------------------------------------------------------------------------------------------  Chemistries  Recent Labs  Lab 12/09/19 1115  NA 133*  K 4.1  CL 104  CO2 20*  GLUCOSE 107*  BUN 32*  CREATININE 2.05*  CALCIUM 9.6  AST 31  ALT 13  ALKPHOS 76  BILITOT 0.4   ------------------------------------------------------------------------------------------------------------------ estimated creatinine clearance is 26.4 mL/min (A) (by C-G formula based on SCr of 2.05 mg/dL (H)). ------------------------------------------------------------------------------------------------------------------ No results for input(s): TSH, T4TOTAL, T3FREE, THYROIDAB in the last 72 hours.  Invalid input(s): FREET3   Coagulation profile No results for input(s): INR, PROTIME in the last 168 hours. ------------------------------------------------------------------------------------------------------------------- No results for input(s): DDIMER in the last 72 hours. -------------------------------------------------------------------------------------------------------------------  Cardiac Enzymes No results for input(s): CKMB, TROPONINI, MYOGLOBIN in the last 168 hours.  Invalid input(s): CK ------------------------------------------------------------------------------------------------------------------    Component Value Date/Time   BNP 3,459.0 (H) 12/09/2019 1419      ---------------------------------------------------------------------------------------------------------------  Urinalysis    Component Value Date/Time   COLORURINE YELLOW 09/12/2019 0153   APPEARANCEUR HAZY (A) 09/12/2019 0153   LABSPEC 1.015 09/12/2019 0153   PHURINE 6.0 09/12/2019 0153   GLUCOSEU NEGATIVE 09/12/2019 0153   HGBUR NEGATIVE 09/12/2019 0153   BILIRUBINUR NEGATIVE 09/12/2019 0153   KETONESUR 5 (A) 09/12/2019 0153   PROTEINUR >=300 (A) 09/12/2019 0153   NITRITE NEGATIVE 09/12/2019 0153   LEUKOCYTESUR NEGATIVE 09/12/2019 0153    ----------------------------------------------------------------------------------------------------------------   Imaging Results:    DG Chest Portable 1 View  Result Date: 12/09/2019 CLINICAL DATA:  EMS reports pt is a residen at [facility]. Reports sob and cp since last night. Per Pts chart Pt has had COVID - unsure when. Hx A-fib, stroke, smoker EXAM: PORTABLE  CHEST 1 VIEW COMPARISON:  09/15/2019 FINDINGS: Lungs density interstitial hazy airspace lung opacities, with more confluent opacity in the right lung base, obscuring the hemidiaphragm. Cardiac silhouette is normal in size.  Mediastinal hilar masses. Probable pleural effusions, larger on the right.  No pneumothorax. IMPRESSION: 1. Interstitial and hazy airspace lung opacities bilaterally with more confluent opacity noted in the right lung base. Findings may reflect multifocal pneumonia or pulmonary edema. Suspect right lung base opacity due to a combination of pleural fluid with either atelectasis or more confluent infection. Electronically Signed   By: Lajean Manes M.D.   On: 12/09/2019 11:34   ECHOCARDIOGRAM COMPLETE  Result Date: 12/09/2019    ECHOCARDIOGRAM REPORT   Patient Name:   Anthony Andrews Date of Exam: 12/09/2019 Medical Rec #:  [id number]   Height:       63.0 in Accession #:    [id number]  Weight:       125.0 lb Date of Birth:  April 29, 1946  BSA:          1.584 m Patient  Age:    66 years    BP:           140/78 mmHg Patient Gender: M           HR:           90 bpm. Exam Location:  [facility] Procedure: 2D Echo Indications:    Dyspnea 786.09 / R06.00  History:        Patient has prior history of Echocardiogram examinations, most                 recent 09/17/2019. Stroke, Arrythmias:Atrial Fibrillation; Risk                 Factors:Current Smoker. Rhabdomyolysis, Pneumonia.  Sonographer:    Leavy Cella RDCS (AE) Referring Phys: [id number] Tykeem Lanzer IMPRESSIONS  1. Left ventricular ejection fraction, by estimation, is 40%. The left ventricle has moderately decreased function. The left ventricle demonstrates global hypokinesis. Left ventricular diastolic parameters are consistent with Grade II diastolic dysfunction (pseudonormalization).  2. Right ventricular systolic function is normal. The right ventricular size is normal. There is moderately elevated pulmonary artery systolic pressure.  3. The mitral valve is grossly normal. Mild mitral valve regurgitation.  4. Morphologically, there appears to be mild aortic stenosis. The aortic valve is tricuspid. Aortic valve regurgitation is not visualized.  5. The inferior vena cava is normal in size with greater than 50% respiratory variability, suggesting right atrial pressure of 3 mmHg. FINDINGS  Left Ventricle: Left ventricular ejection fraction, by estimation, is 40%. The left ventricle has moderately decreased function. The left ventricle demonstrates global hypokinesis. The left ventricular internal cavity size was normal in size. There is no left ventricular hypertrophy. Left ventricular diastolic parameters are consistent with Grade II diastolic dysfunction (pseudonormalization). Indeterminate filling pressures. Right Ventricle: The right ventricular size is normal. No increase in right ventricular wall thickness. Right ventricular systolic function is normal. There is moderately elevated pulmonary artery systolic pressure. The  tricuspid regurgitant velocity is 3.08 m/s, and with an assumed right atrial pressure of 10 mmHg, the estimated right ventricular systolic pressure is 81.8 mmHg. Left Atrium: Left atrial size was normal in size. Right Atrium: Right atrial size was normal in size. Pericardium: There is no evidence of pericardial effusion. Mitral Valve: The mitral valve is grossly normal. Mild mitral valve regurgitation. Tricuspid Valve: The tricuspid valve is grossly normal. Tricuspid valve regurgitation is mild. Aortic Valve: Morphologically,  there appears to be mild aortic stenosis. The aortic valve is tricuspid. . There is mild thickening and mild calcification of the aortic valve. Aortic valve regurgitation is not visualized. Mild aortic valve annular calcification. There is mild thickening of the aortic valve. There is mild calcification of the aortic valve. Pulmonic Valve: The pulmonic valve was grossly normal. Pulmonic valve regurgitation is not visualized. Aorta: The aortic root is normal in size and structure. Venous: The inferior vena cava is normal in size with greater than 50% respiratory variability, suggesting right atrial pressure of 3 mmHg. IAS/Shunts: No atrial level shunt detected by color flow Doppler.  LEFT VENTRICLE PLAX 2D LVIDd:         4.76 cm  Diastology LVIDs:         4.10 cm  LV e' lateral:   6.96 cm/s LV PW:         0.84 cm  LV E/e' lateral: 9.4 LV IVS:        0.79 cm  LV e' medial:    5.98 cm/s LVOT diam:     2.10 cm  LV E/e' medial:  11.0 LVOT Area:     3.46 cm  RIGHT VENTRICLE RV S prime:     11.60 cm/s TAPSE (M-mode): 1.7 cm LEFT ATRIUM             Index       RIGHT ATRIUM           Index LA diam:        4.00 cm 2.53 cm/m  RA Area:     13.90 cm LA Vol (A2C):   74.9 ml 47.30 ml/m RA Volume:   37.30 ml  23.55 ml/m LA Vol (A4C):   42.6 ml 26.90 ml/m LA Biplane Vol: 56.1 ml 35.43 ml/m   AORTA Ao Root diam: 2.80 cm MITRAL VALVE               TRICUSPID VALVE MV Area (PHT): 2.91 cm    TR Peak grad:    37.9 mmHg MV Decel Time: 261 msec    TR Vmax:        308.00 cm/s MR Peak grad: 42.2 mmHg MR Mean grad: 29.0 mmHg    SHUNTS MR Vmax:      325.00 cm/s  Systemic Diam: 2.10 cm MR Vmean:     254.0 cm/s MV E velocity: 65.50 cm/s MV A velocity: 41.70 cm/s MV E/A ratio:  1.57 Kate Sable MD Electronically signed by Kate Sable MD Signature Date/Time: 12/09/2019/3:20:59 PM    Final     Radiological Exams on Admission: DG Chest Portable 1 View  Result Date: 12/09/2019 CLINICAL DATA:  EMS reports pt is a residen at [facility]. Reports sob and cp since last night. Per Pts chart Pt has had COVID - unsure when. Hx A-fib, stroke, smoker EXAM: PORTABLE CHEST 1 VIEW COMPARISON:  09/15/2019 FINDINGS: Lungs density interstitial hazy airspace lung opacities, with more confluent opacity in the right lung base, obscuring the hemidiaphragm. Cardiac silhouette is normal in size.  Mediastinal hilar masses. Probable pleural effusions, larger on the right.  No pneumothorax. IMPRESSION: 1. Interstitial and hazy airspace lung opacities bilaterally with more confluent opacity noted in the right lung base. Findings may reflect multifocal pneumonia or pulmonary edema. Suspect right lung base opacity due to a combination of pleural fluid with either atelectasis or more confluent infection. Electronically Signed   By: Lajean Manes M.D.   On: 12/09/2019 11:34   ECHOCARDIOGRAM COMPLETE  Result Date: 12/09/2019    ECHOCARDIOGRAM REPORT   Patient Name:   Anthony Andrews Date of Exam: 12/09/2019 Medical Rec #:  [id number]   Height:       63.0 in Accession #:    [id number]  Weight:       125.0 lb Date of Birth:  10/12/1945  BSA:          1.584 m Patient Age:    70 years    BP:           140/78 mmHg Patient Gender: M           HR:           90 bpm. Exam Location:  [facility] Procedure: 2D Echo Indications:    Dyspnea 786.09 / R06.00  History:        Patient has prior history of Echocardiogram examinations, most                 recent  09/17/2019. Stroke, Arrythmias:Atrial Fibrillation; Risk                 Factors:Current Smoker. Rhabdomyolysis, Pneumonia.  Sonographer:    Leavy Cella RDCS (AE) Referring Phys: [id number] Annalycia Done IMPRESSIONS  1. Left ventricular ejection fraction, by estimation, is 40%. The left ventricle has moderately decreased function. The left ventricle demonstrates global hypokinesis. Left ventricular diastolic parameters are consistent with Grade II diastolic dysfunction (pseudonormalization).  2. Right ventricular systolic function is normal. The right ventricular size is normal. There is moderately elevated pulmonary artery systolic pressure.  3. The mitral valve is grossly normal. Mild mitral valve regurgitation.  4. Morphologically, there appears to be mild aortic stenosis. The aortic valve is tricuspid. Aortic valve regurgitation is not visualized.  5. The inferior vena cava is normal in size with greater than 50% respiratory variability, suggesting right atrial pressure of 3 mmHg. FINDINGS  Left Ventricle: Left ventricular ejection fraction, by estimation, is 40%. The left ventricle has moderately decreased function. The left ventricle demonstrates global hypokinesis. The left ventricular internal cavity size was normal in size. There is no left ventricular hypertrophy. Left ventricular diastolic parameters are consistent with Grade II diastolic dysfunction (pseudonormalization). Indeterminate filling pressures. Right Ventricle: The right ventricular size is normal. No increase in right ventricular wall thickness. Right ventricular systolic function is normal. There is moderately elevated pulmonary artery systolic pressure. The tricuspid regurgitant velocity is 3.08 m/s, and with an assumed right atrial pressure of 10 mmHg, the estimated right ventricular systolic pressure is 02.5 mmHg. Left Atrium: Left atrial size was normal in size. Right Atrium: Right atrial size was normal in size. Pericardium: There is  no evidence of pericardial effusion. Mitral Valve: The mitral valve is grossly normal. Mild mitral valve regurgitation. Tricuspid Valve: The tricuspid valve is grossly normal. Tricuspid valve regurgitation is mild. Aortic Valve: Morphologically, there appears to be mild aortic stenosis. The aortic valve is tricuspid. . There is mild thickening and mild calcification of the aortic valve. Aortic valve regurgitation is not visualized. Mild aortic valve annular calcification. There is mild thickening of the aortic valve. There is mild calcification of the aortic valve. Pulmonic Valve: The pulmonic valve was grossly normal. Pulmonic valve regurgitation is not visualized. Aorta: The aortic root is normal in size and structure. Venous: The inferior vena cava is normal in size with greater than 50% respiratory variability, suggesting right atrial pressure of 3 mmHg. IAS/Shunts: No atrial level shunt detected by color flow Doppler.  LEFT VENTRICLE PLAX  2D LVIDd:         4.76 cm  Diastology LVIDs:         4.10 cm  LV e' lateral:   6.96 cm/s LV PW:         0.84 cm  LV E/e' lateral: 9.4 LV IVS:        0.79 cm  LV e' medial:    5.98 cm/s LVOT diam:     2.10 cm  LV E/e' medial:  11.0 LVOT Area:     3.46 cm  RIGHT VENTRICLE RV S prime:     11.60 cm/s TAPSE (M-mode): 1.7 cm LEFT ATRIUM             Index       RIGHT ATRIUM           Index LA diam:        4.00 cm 2.53 cm/m  RA Area:     13.90 cm LA Vol (A2C):   74.9 ml 47.30 ml/m RA Volume:   37.30 ml  23.55 ml/m LA Vol (A4C):   42.6 ml 26.90 ml/m LA Biplane Vol: 56.1 ml 35.43 ml/m   AORTA Ao Root diam: 2.80 cm MITRAL VALVE               TRICUSPID VALVE MV Area (PHT): 2.91 cm    TR Peak grad:   37.9 mmHg MV Decel Time: 261 msec    TR Vmax:        308.00 cm/s MR Peak grad: 42.2 mmHg MR Mean grad: 29.0 mmHg    SHUNTS MR Vmax:      325.00 cm/s  Systemic Diam: 2.10 cm MR Vmean:     254.0 cm/s MV E velocity: 65.50 cm/s MV A velocity: 41.70 cm/s MV E/A ratio:  1.57 Kate Sable MD Electronically signed by Kate Sable MD Signature Date/Time: 12/09/2019/3:20:59 PM    Final     DVT Prophylaxis -SCD/iv heparin AM Labs Ordered, also please review Full Orders  Family Communication: Admission, patients condition and plan of care including tests being ordered have been discussed with the patient  who indicate understanding and agree with the plan   Code Status - DNR Likely DC to  --back to [facility] after resolution of acute cardiopulmonary problems  Condition   stable Roxan Hockey M.D on 12/09/2019 at 5:28 PM Go to [email or web address] -  for contact info  [facility] - Office  [phone]

## 2019-12-09 NOTE — ED Notes (Signed)
ECHO in progress- 

## 2019-12-09 NOTE — Progress Notes (Signed)
ANTICOAGULATION CONSULT NOTE - Initial Consult  Pharmacy Consult for heparin  Indication: chest pain/ACS  No Known Allergies  Patient Measurements: Height: 5\' 3"  (160 cm) Weight: 124 lb 9 oz (56.5 kg) IBW/kg (Calculated) : 56.9 Heparin Dosing Weight: 57 kg  Vital Signs: Temp: 97.6 F (36.4 C) (03/17 1643) Temp Source: Oral (03/17 1643) BP: 132/87 (03/17 1643) Pulse Rate: 91 (03/17 1643)  Labs: Recent Labs    12/09/19 1115 12/09/19 1442  HGB 8.8*  --   HCT 27.6*  --   PLT 456*  --   CREATININE 2.05*  --   TROPONINIHS 4,269* 4,848*    Estimated Creatinine Clearance: 26.4 mL/min (A) (by C-G formula based on SCr of 2.05 mg/dL (H)).   Medical History: Past Medical History:  Diagnosis Date  . Acute metabolic encephalopathy   . AKI (acute kidney injury) ([place])   . Atrial fibrillation ([place])   . Chronic hyponatremia   . COVID-19   . Dysphagia   . Essential hypertension   . Hyponatremia   . Kidney failure   . Respiratory failure ([place])   . Rhabdomyolysis   . Stroke ([place])   . Tobacco use     Medications:  Medications Prior to Admission  Medication Sig Dispense Refill Last Dose  . acetaminophen (TYLENOL) 325 MG tablet Take 650 mg by mouth 3 (three) times daily.   12/09/2019 at Unknown time  . apixaban (ELIQUIS) 5 MG TABS tablet Take 1 tablet (5 mg total) by mouth 2 (two) times daily. 60 tablet 0 12/09/2019 at 0800  . aspirin EC 81 MG tablet Take 81 mg by mouth every morning.   12/09/2019 at Unknown time  . cholecalciferol (VITAMIN D3) 25 MCG (1000 UT) tablet Take 1,000 Units by mouth daily.   12/09/2019 at Unknown time  . famotidine (PEPCID) 20 MG tablet Take 20 mg by mouth daily.   12/09/2019 at Unknown time  . loratadine (CLARITIN) 10 MG tablet Take 10 mg by mouth daily.   12/09/2019 at Unknown time  . metoprolol tartrate (LOPRESSOR) 25 MG tablet Take 0.5 tablets (12.5 mg total) by mouth daily. 15 tablet 0 12/09/2019 at 0800  . PARoxetine (PAXIL) 20 MG tablet Take 20 mg by  mouth daily.    12/09/2019 at Unknown time  . rosuvastatin (CRESTOR) 10 MG tablet Take 10 mg by mouth at bedtime.   12/08/2019 at Unknown time  . sodium bicarbonate 650 MG tablet Take 650 mg by mouth daily.   12/09/2019 at Unknown time  . tamsulosin (FLOMAX) 0.4 MG CAPS capsule Take 0.4 mg by mouth daily.   12/09/2019 at Unknown time  . albuterol (VENTOLIN HFA) 108 (90 Base) MCG/ACT inhaler Inhale 2 puffs into the lungs every 4 (four) hours as needed for wheezing or shortness of breath.    unknown   Scheduled:  . acetaminophen  650 mg Oral TID  . [START ON 12/10/2019] aspirin EC  81 mg Oral Q breakfast  . carvedilol  3.125 mg Oral BID WC  . cholecalciferol  1,000 Units Oral Daily  . famotidine  20 mg Oral Daily  . loratadine  10 mg Oral Daily  . PARoxetine  20 mg Oral Daily  . rosuvastatin  20 mg Oral QPM  . sodium bicarbonate  650 mg Oral BID  . sodium chloride flush  3 mL Intravenous Q12H   Infusions:  . sodium chloride      Assessment: Pt with multiple medical hx including CVA who was admitted for CP. He has  been on apixaban for his CVA/afib. It was last take 3/17 at 0800. IV heparin has been ordered to r/o MI. Will start IV heparin tonight after 12 hr from first dose. His heparin level will be elevated due to apixaban so we will use APTT for monitoring until it's correlated.   Goal of Therapy:  Heparin level 0.3-0.7 units/ml  APTT 66-102 Monitor platelets by anticoagulation protocol: Yes   Plan:  Heparin 850 units/hr @2000   Check 8 hr PTT and HL Daily PTT and HL  Onnie Boer, PharmD, [place], AAHIVP, CPP Infectious Disease Pharmacist 12/09/2019 5:34 PM

## 2019-12-10 DIAGNOSIS — R778 Other specified abnormalities of plasma proteins: Secondary | ICD-10-CM

## 2019-12-10 DIAGNOSIS — I10 Essential (primary) hypertension: Secondary | ICD-10-CM | POA: Diagnosis present

## 2019-12-10 DIAGNOSIS — I6523 Occlusion and stenosis of bilateral carotid arteries: Secondary | ICD-10-CM | POA: Diagnosis present

## 2019-12-10 DIAGNOSIS — D649 Anemia, unspecified: Secondary | ICD-10-CM | POA: Diagnosis present

## 2019-12-10 DIAGNOSIS — I480 Paroxysmal atrial fibrillation: Secondary | ICD-10-CM

## 2019-12-10 DIAGNOSIS — I5043 Acute on chronic combined systolic (congestive) and diastolic (congestive) heart failure: Secondary | ICD-10-CM | POA: Diagnosis present

## 2019-12-10 DIAGNOSIS — N183 Chronic kidney disease, stage 3 unspecified: Secondary | ICD-10-CM

## 2019-12-10 DIAGNOSIS — R9430 Abnormal result of cardiovascular function study, unspecified: Secondary | ICD-10-CM

## 2019-12-10 LAB — BASIC METABOLIC PANEL
Anion gap: 10 (ref 5–15)
BUN: 45 mg/dL — ABNORMAL HIGH (ref 8–23)
CO2: 20 mmol/L — ABNORMAL LOW (ref 22–32)
Calcium: 9.3 mg/dL (ref 8.9–10.3)
Chloride: 104 mmol/L (ref 98–111)
Creatinine, Ser: 2.43 mg/dL — ABNORMAL HIGH (ref 0.61–1.24)
GFR calc Af Amer: 30 mL/min — ABNORMAL LOW (ref >60)
GFR calc non Af Amer: 26 mL/min — ABNORMAL LOW (ref >60)
Glucose, Bld: 109 mg/dL — ABNORMAL HIGH (ref 70–99)
Potassium: 4 mmol/L (ref 3.5–5.1)
Sodium: 134 mmol/L — ABNORMAL LOW (ref 135–145)

## 2019-12-10 LAB — CBC
HCT: 25.1 % — ABNORMAL LOW (ref 39.0–52.0)
Hemoglobin: 8 g/dL — ABNORMAL LOW (ref 13.0–17.0)
MCH: 31.5 pg (ref 26.0–34.0)
MCHC: 31.9 g/dL (ref 30.0–36.0)
MCV: 98.8 fL (ref 80.0–100.0)
Platelets: 419 10*3/uL — ABNORMAL HIGH (ref 150–400)
RBC: 2.54 MIL/uL — ABNORMAL LOW (ref 4.22–5.81)
RDW: 14.6 % (ref 11.5–15.5)
WBC: 7 10*3/uL (ref 4.0–10.5)
nRBC: 0 % (ref 0.0–0.2)

## 2019-12-10 LAB — HEPARIN LEVEL (UNFRACTIONATED): Heparin Unfractionated: >2.2 IU/mL — ABNORMAL HIGH (ref 0.30–0.70)

## 2019-12-10 LAB — TROPONIN I (HIGH SENSITIVITY): Troponin I (High Sensitivity): 3050 ng/L (ref <18)

## 2019-12-10 LAB — APTT
aPTT: 78 seconds — ABNORMAL HIGH (ref 24–36)
aPTT: 54 seconds — ABNORMAL HIGH (ref 24–36)

## 2019-12-10 MED ORDER — IPRATROPIUM-ALBUTEROL 0.5-2.5 (3) MG/3ML IN SOLN
3.0000 mL | Freq: Four times a day (QID) | RESPIRATORY_TRACT | Status: DC
  Administered 2019-12-10 – 2019-12-13 (×11): 3 mL via RESPIRATORY_TRACT
  Filled 2019-12-10 (×11): qty 3

## 2019-12-10 NOTE — Progress Notes (Signed)
ANTICOAGULATION CONSULT NOTE -   Pharmacy Consult for heparin  Indication: chest pain/ACS  No Known Allergies  Patient Measurements: Height: 5\' 3"  (160 cm) Weight: 132 lb 0.9 oz (59.9 kg) IBW/kg (Calculated) : 56.9 Heparin Dosing Weight: 57 kg  Vital Signs: Temp: 98.2 F (36.8 C) (03/18 0449) Temp Source: Oral (03/18 0449) BP: 109/66 (03/18 0449) Pulse Rate: 90 (03/18 0449)  Labs: Recent Labs    12/09/19 1115 12/09/19 1442 12/09/19 1749 12/10/19 0613  HGB 8.8*  --   --  8.0*  HCT 27.6*  --   --  25.1*  PLT 456*  --   --  419*  APTT  --   --  <20* 78*  LABPROT  --   --  <10.0*  --   INR  --   --  NOT CALCULATED  --   HEPARINUNFRC  --   --  >2.20* >2.20*  CREATININE 2.05*  --   --  2.43*  TROPONINIHS 4,269* 4,848*  --  3,050*    Estimated Creatinine Clearance: 22.4 mL/min (A) (by C-G formula based on SCr of 2.43 mg/dL (H)).   Medical History: Past Medical History:  Diagnosis Date  . Acute metabolic encephalopathy   . AKI (acute kidney injury) ([place])   . Atrial fibrillation ([place])   . Chronic hyponatremia   . COVID-19   . Dysphagia   . Essential hypertension   . Hyponatremia   . Kidney failure   . Respiratory failure ([place])   . Rhabdomyolysis   . Stroke ([place])   . Tobacco use     Medications:  Medications Prior to Admission  Medication Sig Dispense Refill Last Dose  . acetaminophen (TYLENOL) 325 MG tablet Take 650 mg by mouth 3 (three) times daily.   12/09/2019 at Unknown time  . apixaban (ELIQUIS) 5 MG TABS tablet Take 1 tablet (5 mg total) by mouth 2 (two) times daily. 60 tablet 0 12/09/2019 at 0800  . aspirin EC 81 MG tablet Take 81 mg by mouth every morning.   12/09/2019 at Unknown time  . cholecalciferol (VITAMIN D3) 25 MCG (1000 UT) tablet Take 1,000 Units by mouth daily.   12/09/2019 at Unknown time  . famotidine (PEPCID) 20 MG tablet Take 20 mg by mouth daily.   12/09/2019 at Unknown time  . loratadine (CLARITIN) 10 MG tablet Take 10 mg by mouth daily.    12/09/2019 at Unknown time  . metoprolol tartrate (LOPRESSOR) 25 MG tablet Take 0.5 tablets (12.5 mg total) by mouth daily. 15 tablet 0 12/09/2019 at 0800  . PARoxetine (PAXIL) 20 MG tablet Take 20 mg by mouth daily.    12/09/2019 at Unknown time  . rosuvastatin (CRESTOR) 10 MG tablet Take 10 mg by mouth at bedtime.   12/08/2019 at Unknown time  . sodium bicarbonate 650 MG tablet Take 650 mg by mouth daily.   12/09/2019 at Unknown time  . tamsulosin (FLOMAX) 0.4 MG CAPS capsule Take 0.4 mg by mouth daily.   12/09/2019 at Unknown time  . albuterol (VENTOLIN HFA) 108 (90 Base) MCG/ACT inhaler Inhale 2 puffs into the lungs every 4 (four) hours as needed for wheezing or shortness of breath.    unknown   Scheduled:  . acetaminophen  650 mg Oral TID  . aspirin EC  81 mg Oral Q breakfast  . carvedilol  3.125 mg Oral BID WC  . cholecalciferol  1,000 Units Oral Daily  . doxycycline  100 mg Oral Q12H  . famotidine  20  mg Oral Daily  . furosemide  40 mg Intravenous BID  . guaiFENesin  600 mg Oral BID  . loratadine  10 mg Oral Daily  . PARoxetine  20 mg Oral Daily  . rosuvastatin  20 mg Oral QPM  . sodium bicarbonate  650 mg Oral BID  . sodium chloride flush  3 mL Intravenous Q12H   Infusions:  . sodium chloride    . cefTRIAXone (ROCEPHIN)  IV    . heparin 850 Units/hr (12/09/19 2011)    Assessment: Pt with multiple medical hx including CVA who was admitted for CP. He has been on apixaban for his CVA/afib. It was last taken 3/17 at 0800. IV heparin has been ordered to r/o MI. Will start IV heparin tonight after 12 hr from first dose. His heparin level will be elevated due to apixaban so we will use APTT for monitoring until it's correlated.   HL > 2.20 APTT 78- therapeutic  Goal of Therapy:  Heparin level 0.3-0.7 units/ml  APTT 66-102 Monitor platelets by anticoagulation protocol: Yes   Plan:  Continue heparin infusion at 850 units/hr  Confirmatory level in 8 hours. Daily PTT and  HL  Margot Ables, PharmD Clinical Pharmacist 12/10/2019 9:02 AM

## 2019-12-10 NOTE — Progress Notes (Signed)
Patient Demographics:    Anthony Andrews, is a 74 y.o. male, DOB - 12-Apr-1946, YSA:[id number]  Admit date - 12/09/2019   Admitting Physician Arisbeth Purrington Denton Brick, MD  Outpatient Primary MD for the patient is [name], MD  LOS - 1   Chief Complaint  Patient presents with  . Shortness of Breath        Subjective:    [facility] today has no fevers, no emesis,  No chest pain,   -Orthopnea and dyspnea improving, dyspnea on exertion persist -Voiding okay -Patient son at bedside, questions answered  Assessment  & Plan :    Principal Problem:   Abnormal result of cardiovascular function study suggestive of non-ST elevation myocardial infarction (NSTEMI) Active Problems:   Acute respiratory failure with hypoxia (HCC)   Acute on chronic HFrEF (heart failure with reduced ejection fraction) (HCC)   AKI (acute kidney injury) on CKD IIIa   Atrial fibrillation (HCC)   Pneumonia   Non-ST elevation (NSTEMI) myocardial infarction (HCC)   Tobacco abuse   Acute on chronic combined systolic and diastolic CHF (congestive heart failure) (HCC)   PAF (paroxysmal atrial fibrillation) (HCC)   Essential hypertension   Bilateral carotid artery stenosis   Anemia   Stage 3 chronic kidney disease   Brief Summary 74 y.o. male with PMHx of Prior CVA with mild residual left-sided weakness, HTN, ongoing tobacco abuse, PAF on chronic anticoagulation with Eliquis, chronic anemia, recurrent falls syncope and status post 30-day event monitor placed on 09/16/2020     A/p 1)Possible NSTEMI--- ???  Due to demand ischemia in the setting of CHF exacerbation --- on admission patient presented  with dyspnea, EKG with ST depression nonspecific ST and T wave changes -No chest pain, dyspnea improving, dyspnea on exertion persist - echo with EF down to 40% from 45 to 50% less than 3 months ago on 09/17/2019, echo today also shows global  hypokinesis and grade 2 diastolic dysfunction, pulmonary hypertension -Troponin 4,269 >>  4,848 >>> 3,050 -Stop Eliquis, start IV heparin, -Continue Coreg, Crestor and aspirin -Cardiology consult appreciated, patient may need RHC/LHC when renal function permits  2)HFrEF---  patient admitted with acute on chronic combined systolic and diastolic CHF exacerbation  --- Admission chest x-ray with possible pulmonary edema and pleural fluid -Repeat echo on 12/09/2018 as noted above #1 -BNP  3459 -Elevated troponins as above --Continue IV Lasix ACE/ARB therapy was not prescribed due to Worsening renal function. -Monitor daily weights and fluid input and output accurately  3)AKI----acute kidney injury on CKD stage - IIIa-worsening renal function might be due to CHF exacerbation with decreased renal perfusion   creatinine on admission= 2.0 , baseline creatinine = 1.4 (09/21/2019)    , creatinine 92.4 most likely due to diuresis, -- renally adjust medications, avoid nephrotoxic agents / dehydration  / hypotension --Continue sodium bicarb -Monitor renal function closely with IV diuresis for #2 above  4) chronic normocytic and normochromic anemia--- hemoglobin currently down to 8.0 from 8.8 on admission which is close to patient's baseline -No evidence of ongoing bleeding the patient is on Eliquis and now  switched to IV heparin -Stool occult blood requested  5) history of PAFib/history of second-degree AV block/history of tachyarrhythmia or bradyarrhythmia--- patient had a 30-day event  monitor placed on 11/18/2019 -Preliminary review of his 30-day event monitor shows no significant arrhythmias -Continue Coreg, Eliquis stopped due to NSTEMI, IV heparin for now  6) history of COVID-19 infection--- resolved, asymptomatic, repeat Covid test negative at this time  7) history of prior stroke with residual left-sided hemiparesis----anticoagulation as above #1 and #5 -Continue statin and  aspirin  8) possible pneumonia-----no fevers, no leukocytosis, patient with shortness of breath, some cough, chest x-ray findings with pneumonia versus pulmonary edema--- suspect symptoms more likely from #1 and #2 above earlier than frank pneumonia -Continue Rocephin and doxycycline and as well as mucolytics and bronchodilators for possible pneumonia  9)tobacco abuse--- smoking cessation strongly advised, patient is not ready to quit  10) acute hypoxic respiratory failure--- secondary to #1 and #2 above, continue supplemental oxygen, treat as above #1 and #2  11)Social/Ethics--- patient is a DNR/DNI, he is from [facility] where he has lived for over 6 months now   Disposition/Need for in-Hospital Stay- patient unable to be discharged at this time due to --- acute CHF exacerbation requiring further IV diuresis and IV heparin for possible NSTEMI -Anticipate discharge back to [facility] for discharge once acute medical issues resolved pending physical therapy eval -Not medically ready for discharge at this time  Code Status : DNR  Family Communication:    (patient is alert, awake and coherent) --- Plan of care discussed with patient's son at bedside  Consults  :  cardiology  DVT Prophylaxis  : IV heparin  Lab Results  Component Value Date   PLT 419 (H) 12/10/2019    Inpatient Medications  Scheduled Meds: . acetaminophen  650 mg Oral TID  . aspirin EC  81 mg Oral Q breakfast  . carvedilol  3.125 mg Oral BID WC  . cholecalciferol  1,000 Units Oral Daily  . doxycycline  100 mg Oral Q12H  . famotidine  20 mg Oral Daily  . furosemide  40 mg Intravenous BID  . guaiFENesin  600 mg Oral BID  . loratadine  10 mg Oral Daily  . PARoxetine  20 mg Oral Daily  . rosuvastatin  20 mg Oral QPM  . sodium bicarbonate  650 mg Oral BID  . sodium chloride flush  3 mL Intravenous Q12H   Continuous Infusions: . sodium chloride    . cefTRIAXone (ROCEPHIN)  IV 1 g (12/10/19 1427)  .  heparin 850 Units/hr (12/09/19 2011)   PRN Meds:.sodium chloride, acetaminophen **OR** acetaminophen, albuterol, ondansetron **OR** ondansetron (ZOFRAN) IV, polyethylene glycol, sodium chloride flush, traZODone    Anti-infectives (From admission, onward)   Start     Dose/Rate Route Frequency Ordered Stop   12/10/19 1400  cefTRIAXone (ROCEPHIN) 1 g in sodium chloride 0.9 % 100 mL IVPB     1 g 200 mL/hr over 30 Minutes Intravenous Every 24 hours 12/09/19 1741     12/09/19 2200  doxycycline (VIBRA-TABS) tablet 100 mg     100 mg Oral Every 12 hours 12/09/19 1741     12/09/19 1315  cefTRIAXone (ROCEPHIN) 1 g in sodium chloride 0.9 % 100 mL IVPB     1 g 200 mL/hr over 30 Minutes Intravenous  Once 12/09/19 1310 12/09/19 1445   12/09/19 1315  azithromycin (ZITHROMAX) 500 mg in sodium chloride 0.9 % 250 mL IVPB     500 mg 250 mL/hr over 60 Minutes Intravenous  Once 12/09/19 1310 12/09/19 1554        Objective:   Vitals:   12/10/19 0500  12/10/19 0800 12/10/19 0914 12/10/19 1423  BP:   109/67 102/64  Pulse:   92 87  Resp:    18  Temp:    98.6 F (37 C)  TempSrc:    Oral  SpO2:  97% 96% 95%  Weight: 59.9 kg     Height:        Wt Readings from Last 3 Encounters:  12/10/19 59.9 kg  11/17/19 57.1 kg  10/01/19 85.3 kg     Intake/Output Summary (Last 24 hours) at 12/10/2019 1621 Last data filed at 12/10/2019 0800 Gross per 24 hour  Intake 334.63 ml  Output 250 ml  Net 84.63 ml     Physical Exam  Gen:- Awake Alert,  In no apparent distress  HEENT:- [place].AT, No sclera icterus Neck-Supple Neck,No JVD,.  Lungs-diminished in bases with faint bibasilar rales CV- S1, S2 normal, regular , anterior chest wall with event monitor  abd-  +ve B.Sounds, Abd Soft, No tenderness,    Extremity/Skin:- trace edema, pedal pulses present  Psych-affect is appropriate, oriented x3 Neuro-no new focal deficits, no tremors   Data Review:   Micro Results Recent Results (from the past 240 hour(s))   Respiratory Panel by RT PCR (Flu A&B, Covid) - Nasopharyngeal Swab     Status: None   Collection Time: 12/09/19  2:21 PM   Specimen: Nasopharyngeal Swab  Result Value Ref Range Status   SARS Coronavirus 2 by RT PCR NEGATIVE NEGATIVE Final    Comment: (NOTE) SARS-CoV-2 target nucleic acids are NOT DETECTED. The SARS-CoV-2 RNA is generally detectable in upper respiratoy specimens during the acute phase of infection. The lowest concentration of SARS-CoV-2 viral copies this assay can detect is 131 copies/mL. A negative result does not preclude SARS-Cov-2 infection and should not be used as the sole basis for treatment or other patient management decisions. A negative result may occur with  improper specimen collection/handling, submission of specimen other than nasopharyngeal swab, presence of viral mutation(s) within the areas targeted by this assay, and inadequate number of viral copies (<131 copies/mL). A negative result must be combined with clinical observations, patient history, and epidemiological information. The expected result is Negative. Fact Sheet for Patients:  PinkCheek.be Fact Sheet for Healthcare Providers:  GravelBags.it This test is not yet ap proved or cleared by the [place] FDA and  has been authorized for detection and/or diagnosis of SARS-CoV-2 by FDA under an Emergency Use Authorization (EUA). This EUA will remain  in effect (meaning this test can be used) for the duration of the COVID-19 declaration under Section 564(b)(1) of the Act, 21 U.S.C. section 360bbb-3(b)(1), unless the authorization is terminated or revoked sooner.    Influenza A by PCR NEGATIVE NEGATIVE Final   Influenza B by PCR NEGATIVE NEGATIVE Final    Comment: (NOTE) The Xpert Xpress SARS-CoV-2/FLU/RSV assay is intended as an aid in  the diagnosis of influenza from Nasopharyngeal swab specimens and  should not be used as a sole  basis for treatment. Nasal washings and  aspirates are unacceptable for Xpert Xpress SARS-CoV-2/FLU/RSV  testing. Fact Sheet for Patients: PinkCheek.be Fact Sheet for Healthcare Providers: GravelBags.it This test is not yet approved or cleared by the [place] FDA and  has been authorized for detection and/or diagnosis of SARS-CoV-2 by  FDA under an Emergency Use Authorization (EUA). This EUA will remain  in effect (meaning this test can be used) for the duration of the  Covid-19 declaration under Section 564(b)(1) of the Act, 21  U.S.C.  section 360bbb-3(b)(1), unless the authorization is  terminated or revoked. Performed at [facility], [address], [place], [place] [address]     Radiology Reports DG Chest Portable 1 View  Result Date: 12/09/2019 CLINICAL DATA:  EMS reports pt is a residen at [facility]. Reports sob and cp since last night. Per Pts chart Pt has had COVID - unsure when. Hx A-fib, stroke, smoker EXAM: PORTABLE CHEST 1 VIEW COMPARISON:  09/15/2019 FINDINGS: Lungs density interstitial hazy airspace lung opacities, with more confluent opacity in the right lung base, obscuring the hemidiaphragm. Cardiac silhouette is normal in size.  Mediastinal hilar masses. Probable pleural effusions, larger on the right.  No pneumothorax. IMPRESSION: 1. Interstitial and hazy airspace lung opacities bilaterally with more confluent opacity noted in the right lung base. Findings may reflect multifocal pneumonia or pulmonary edema. Suspect right lung base opacity due to a combination of pleural fluid with either atelectasis or more confluent infection. Electronically Signed   By: Lajean Manes M.D.   On: 12/09/2019 11:34   ECHOCARDIOGRAM COMPLETE  Result Date: 12/09/2019    ECHOCARDIOGRAM REPORT   Patient Name:   TED GOODNER Date of Exam: 12/09/2019 Medical Rec #:  [id number]   Height:       63.0 in Accession #:    [id number]  Weight:        125.0 lb Date of Birth:  1946/03/26  BSA:          1.584 m Patient Age:    62 years    BP:           140/78 mmHg Patient Gender: M           HR:           90 bpm. Exam Location:  [facility] Procedure: 2D Echo Indications:    Dyspnea 786.09 / R06.00  History:        Patient has prior history of Echocardiogram examinations, most                 recent 09/17/2019. Stroke, Arrythmias:Atrial Fibrillation; Risk                 Factors:Current Smoker. Rhabdomyolysis, Pneumonia.  Sonographer:    Leavy Cella RDCS (AE) Referring Phys: [id number] Merci Walthers IMPRESSIONS  1. Left ventricular ejection fraction, by estimation, is 40%. The left ventricle has moderately decreased function. The left ventricle demonstrates global hypokinesis. Left ventricular diastolic parameters are consistent with Grade II diastolic dysfunction (pseudonormalization).  2. Right ventricular systolic function is normal. The right ventricular size is normal. There is moderately elevated pulmonary artery systolic pressure.  3. The mitral valve is grossly normal. Mild mitral valve regurgitation.  4. Morphologically, there appears to be mild aortic stenosis. The aortic valve is tricuspid. Aortic valve regurgitation is not visualized.  5. The inferior vena cava is normal in size with greater than 50% respiratory variability, suggesting right atrial pressure of 3 mmHg. FINDINGS  Left Ventricle: Left ventricular ejection fraction, by estimation, is 40%. The left ventricle has moderately decreased function. The left ventricle demonstrates global hypokinesis. The left ventricular internal cavity size was normal in size. There is no left ventricular hypertrophy. Left ventricular diastolic parameters are consistent with Grade II diastolic dysfunction (pseudonormalization). Indeterminate filling pressures. Right Ventricle: The right ventricular size is normal. No increase in right ventricular wall thickness. Right ventricular systolic function is  normal. There is moderately elevated pulmonary artery systolic pressure. The tricuspid regurgitant velocity is 3.08 m/s, and with  an assumed right atrial pressure of 10 mmHg, the estimated right ventricular systolic pressure is 76.2 mmHg. Left Atrium: Left atrial size was normal in size. Right Atrium: Right atrial size was normal in size. Pericardium: There is no evidence of pericardial effusion. Mitral Valve: The mitral valve is grossly normal. Mild mitral valve regurgitation. Tricuspid Valve: The tricuspid valve is grossly normal. Tricuspid valve regurgitation is mild. Aortic Valve: Morphologically, there appears to be mild aortic stenosis. The aortic valve is tricuspid. . There is mild thickening and mild calcification of the aortic valve. Aortic valve regurgitation is not visualized. Mild aortic valve annular calcification. There is mild thickening of the aortic valve. There is mild calcification of the aortic valve. Pulmonic Valve: The pulmonic valve was grossly normal. Pulmonic valve regurgitation is not visualized. Aorta: The aortic root is normal in size and structure. Venous: The inferior vena cava is normal in size with greater than 50% respiratory variability, suggesting right atrial pressure of 3 mmHg. IAS/Shunts: No atrial level shunt detected by color flow Doppler.  LEFT VENTRICLE PLAX 2D LVIDd:         4.76 cm  Diastology LVIDs:         4.10 cm  LV e' lateral:   6.96 cm/s LV PW:         0.84 cm  LV E/e' lateral: 9.4 LV IVS:        0.79 cm  LV e' medial:    5.98 cm/s LVOT diam:     2.10 cm  LV E/e' medial:  11.0 LVOT Area:     3.46 cm  RIGHT VENTRICLE RV S prime:     11.60 cm/s TAPSE (M-mode): 1.7 cm LEFT ATRIUM             Index       RIGHT ATRIUM           Index LA diam:        4.00 cm 2.53 cm/m  RA Area:     13.90 cm LA Vol (A2C):   74.9 ml 47.30 ml/m RA Volume:   37.30 ml  23.55 ml/m LA Vol (A4C):   42.6 ml 26.90 ml/m LA Biplane Vol: 56.1 ml 35.43 ml/m   AORTA Ao Root diam: 2.80 cm MITRAL  VALVE               TRICUSPID VALVE MV Area (PHT): 2.91 cm    TR Peak grad:   37.9 mmHg MV Decel Time: 261 msec    TR Vmax:        308.00 cm/s MR Peak grad: 42.2 mmHg MR Mean grad: 29.0 mmHg    SHUNTS MR Vmax:      325.00 cm/s  Systemic Diam: 2.10 cm MR Vmean:     254.0 cm/s MV E velocity: 65.50 cm/s MV A velocity: 41.70 cm/s MV E/A ratio:  1.57 Kate Sable MD Electronically signed by Kate Sable MD Signature Date/Time: 12/09/2019/3:20:59 PM    Final      CBC Recent Labs  Lab 12/09/19 1115 12/10/19 0613  WBC 7.9 7.0  HGB 8.8* 8.0*  HCT 27.6* 25.1*  PLT 456* 419*  MCV 98.9 98.8  MCH 31.5 31.5  MCHC 31.9 31.9  RDW 14.8 14.6  LYMPHSABS 1.6  --   MONOABS 0.6  --   EOSABS 0.1  --   BASOSABS 0.1  --     Chemistries  Recent Labs  Lab 12/09/19 1115 12/10/19 0613  NA 133* 134*  K 4.1 4.0  CL 104 104  CO2 20* 20*  GLUCOSE 107* 109*  BUN 32* 45*  CREATININE 2.05* 2.43*  CALCIUM 9.6 9.3  AST 31  --   ALT 13  --   ALKPHOS 76  --   BILITOT 0.4  --    ------------------------------------------------------------------------------------------------------------------ No results for input(s): CHOL, HDL, LDLCALC, TRIG, CHOLHDL, LDLDIRECT in the last 72 hours.  No results found for: HGBA1C ------------------------------------------------------------------------------------------------------------------ No results for input(s): TSH, T4TOTAL, T3FREE, THYROIDAB in the last 72 hours.  Invalid input(s): FREET3 ------------------------------------------------------------------------------------------------------------------ No results for input(s): VITAMINB12, FOLATE, FERRITIN, TIBC, IRON, RETICCTPCT in the last 72 hours.  Coagulation profile Recent Labs  Lab 12/09/19 1749  INR NOT CALCULATED    No results for input(s): DDIMER in the last 72 hours.  Cardiac Enzymes No results for input(s): CKMB, TROPONINI, MYOGLOBIN in the last 168 hours.  Invalid input(s):  CK ------------------------------------------------------------------------------------------------------------------    Component Value Date/Time   BNP 3,459.0 (H) 12/09/2019 1419     Roxan Hockey M.D on 12/10/2019 at 4:21 PM  Go to [email or web address] - for contact info  [facility] - Office  [phone]

## 2019-12-10 NOTE — Consult Note (Addendum)
Cardiology Consult    Patient ID: Anthony Andrews; [id number]; 1946/03/20   Admit date: 12/09/2019 Date of Consult: 12/10/2019  Primary Care Provider: [name], MD Primary Cardiologist: Quay Burow, MD   Patient Profile    Anthony Andrews is a 74 y.o. male with past medical history of HTN, HLD, carotid artery stenosis, paroxysmal atrial fibrillation (diagnosed in 08/2019 with episodes of secondary AV block during admission), cardiomyopathy (EF 45-50% by echo in 08/2019), chronic anemia and prior CVA who is being seen today for the evaluation of NSTEMI at the request of Dr. Denton Brick.   History of Present Illness    Anthony Andrews was last examined by Almyra Deforest, PA-C on 11/17/2019 and reported intermittent episodes of dizziness at that time but denied any chest pain, palpitations or dyspnea  He reported having another syncopal episode in February and it was recommended he have a 30-day cardiac event monitor to rule out any significant pauses or bradycardia but this has not yet resulted. By the review of the [employer] website, he was enrolled on 11/27/2019 with no significant arrhythmias thus far.   He presented to [facility] ED on 12/09/[facility] from [facility] for evaluation of chest pain and worsening dyspnea. In talking with the patient today, he reports having recent orthopnea over the past few weeks and has been having to sleep propped up. He has also noted swelling along his legs. He does walk with a walker at SNF and reports having dyspnea at rest and with activity over the past several days. On my interview, he denies any recent chest pain or palpitations.  He does report intermittent dizziness which typically occurs when sitting still.  He denies any actual syncopal events since 10/2019.  Initial labs show WBC 7.9, Hgb 8.8 (close to baseline), platelets 456, Na+ 133, K+ 4.1 and creatinine 2.05 (baseline 1.2 - 1.3). Initial HS Troponin 4269 with repeat of 4848. BNP 3459. Negative for COVID-19. CXR showed  interstitial and hazy airspace lung opacities bilaterally with more confluent opacity noted in the right lung base and findings thought to be consistent with multifocal PNA or pulmonary edema. EKG shows NSR, HR 86 with PVC's and ST depression along V5 and V6.   He received IV Lasix 60mg  in the ED yesterday along with another 40mg  of Lasix yesterday evening. I&O's not recorded and weight listed as having increased from 125 lbs to 132 lbs (weight was 125 lbs at the time of his office visit in 10/2019). Repeat limited echo showed an EF of 40% with Grade 2 DD and global HK. RV function was normal with mild MR and mild AS noted.    Past Medical History:  Diagnosis Date  . Acute metabolic encephalopathy   . AKI (acute kidney injury) ([place])   . Atrial fibrillation ([place])   . Chronic hyponatremia   . COVID-19   . Dysphagia   . Essential hypertension   . Hyponatremia   . Kidney failure   . Respiratory failure ([place])   . Rhabdomyolysis   . Stroke ([place])   . Tobacco use     Past Surgical History:  Procedure Laterality Date  . BACK SURGERY    . SHOULDER SURGERY       Home Medications:  Prior to Admission medications   Medication Sig Start Date End Date Taking? Authorizing Provider  acetaminophen (TYLENOL) 325 MG tablet Take 650 mg by mouth 3 (three) times daily.   Yes [provider]  apixaban (ELIQUIS) 5 MG TABS tablet Take  1 tablet (5 mg total) by mouth 2 (two) times daily. 09/21/19 12/09/19 Yes Shah, Pratik D, DO  aspirin EC 81 MG tablet Take 81 mg by mouth every morning.   Yes [provider]  cholecalciferol (VITAMIN D3) 25 MCG (1000 UT) tablet Take 1,000 Units by mouth daily.   Yes [provider]  famotidine (PEPCID) 20 MG tablet Take 20 mg by mouth daily.   Yes [provider]  loratadine (CLARITIN) 10 MG tablet Take 10 mg by mouth daily.   Yes [provider]  metoprolol tartrate (LOPRESSOR) 25 MG tablet Take 0.5 tablets (12.5 mg total) by  mouth daily. 09/21/19 12/09/19 Yes Shah, Pratik D, DO  PARoxetine (PAXIL) 20 MG tablet Take 20 mg by mouth daily.    Yes [provider]  rosuvastatin (CRESTOR) 10 MG tablet Take 10 mg by mouth at bedtime.   Yes [provider]  sodium bicarbonate 650 MG tablet Take 650 mg by mouth daily.   Yes [provider]  tamsulosin (FLOMAX) 0.4 MG CAPS capsule Take 0.4 mg by mouth daily.   Yes [provider]  albuterol (VENTOLIN HFA) 108 (90 Base) MCG/ACT inhaler Inhale 2 puffs into the lungs every 4 (four) hours as needed for wheezing or shortness of breath.  05/27/19   [provider]    Inpatient Medications: Scheduled Meds: . acetaminophen  650 mg Oral TID  . aspirin EC  81 mg Oral Q breakfast  . carvedilol  3.125 mg Oral BID WC  . cholecalciferol  1,000 Units Oral Daily  . doxycycline  100 mg Oral Q12H  . famotidine  20 mg Oral Daily  . furosemide  40 mg Intravenous BID  . guaiFENesin  600 mg Oral BID  . loratadine  10 mg Oral Daily  . PARoxetine  20 mg Oral Daily  . rosuvastatin  20 mg Oral QPM  . sodium bicarbonate  650 mg Oral BID  . sodium chloride flush  3 mL Intravenous Q12H   Continuous Infusions: . sodium chloride    . cefTRIAXone (ROCEPHIN)  IV    . heparin 850 Units/hr (12/09/19 2011)   PRN Meds: sodium chloride, acetaminophen **OR** acetaminophen, albuterol, ondansetron **OR** ondansetron (ZOFRAN) IV, polyethylene glycol, sodium chloride flush, traZODone  Allergies:   No Known Allergies  Social History:   Social History   Socioeconomic History  . Marital status: Married    Spouse name: Not on file  . Number of children: Not on file  . Years of education: Not on file  . Highest education level: Not on file  Occupational History  . Not on file  Tobacco Use  . Smoking status: Current Every Day Smoker    Types: Cigarettes  . Smokeless tobacco: Former Systems developer  . Tobacco comment: 3 cigs/per day  Substance and Sexual Activity   . Alcohol use: Not Currently  . Drug use: Never  . Sexual activity: Not on file  Other Topics Concern  . Not on file  Social History Narrative  . Not on file   Social Determinants of Health   Financial Resource Strain:   . Difficulty of Paying Living Expenses:   Food Insecurity:   . Worried About Charity fundraiser in the Last Year:   . Arboriculturist in the Last Year:   Transportation Needs:   . Film/video editor (Medical):   Marland Kitchen Lack of Transportation (Non-Medical):   Physical Activity:   . Days of Exercise per Week:   .  Minutes of Exercise per Session:   Stress:   . Feeling of Stress :   Social Connections:   . Frequency of Communication with Friends and Family:   . Frequency of Social Gatherings with Friends and Family:   . Attends Religious Services:   . Active Member of Clubs or Organizations:   . Attends Archivist Meetings:   Marland Kitchen Marital Status:   Intimate Partner Violence:   . Fear of Current or Ex-Partner:   . Emotionally Abused:   Marland Kitchen Physically Abused:   . Sexually Abused:      Family History:    Family History  Problem Relation Age of Onset  . Heart attack Mother   . Heart attack Father       Review of Systems    General:  No chills, fever, night sweats or weight changes.  Cardiovascular:  No chest pain, palpitations, paroxysmal nocturnal dyspnea. Positive for orthopnea, dyspnea on exertion and edema.  Dermatological: No rash, lesions/masses Respiratory: No cough, dyspnea Urologic: No hematuria, dysuria Abdominal:   No nausea, vomiting, diarrhea, bright red blood per rectum, melena, or hematemesis Neurologic:  No visual changes, wkns, changes in mental status. Positive for dizziness.   All other systems reviewed and are otherwise negative except as noted above.  Physical Exam/Data    Vitals:   12/09/19 1957 12/09/19 2346 12/10/19 0449 12/10/19 0500  BP: 121/80 122/67 109/66   Pulse: 89 64 90   Resp: 20 16 17    Temp: 98.3 F  (36.8 C) 99.7 F (37.6 C) 98.2 F (36.8 C)   TempSrc: Oral Oral Oral   SpO2: 96% 98% 98%   Weight:    59.9 kg  Height:        Intake/Output Summary (Last 24 hours) at 12/10/2019 0901 Last data filed at 12/09/2019 1554 Gross per 24 hour  Intake 350 ml  Output --  Net 350 ml   Filed Weights   12/09/19 1045 12/09/19 1643 12/10/19 0500  Weight: 56.7 kg 56.5 kg 59.9 kg   Body mass index is 23.39 kg/m.   General: Pleasant, thin male appearing in NAD Psych: Normal affect. Neuro: Alert and oriented X 3. Moves all extremities spontaneously. HEENT: Normal  Neck: Supple without bruits. JVD at 9 cm. Lungs:  Resp regular and unlabored, rales along bases bilaterally. Heart: RRR no s3, s4, or murmurs. Abdomen: Soft, non-tender, non-distended, BS + x 4.  Extremities: No clubbing or cyanosis. Trace lower extremity edema. DP/PT/Radials 2+ and equal bilaterally.   EKG:  The EKG was personally reviewed and demonstrates: NSR, HR 86 with PVC's and ST depression along V5 and V6.   Telemetry:  Telemetry was personally reviewed and demonstrates: NSR, HR in 70's to 80's with occasional PVC's, sometimes occurring in a trigeminal pattern.   Labs/Studies     Relevant CV Studies:  Echocardiogram: 08/2019 IMPRESSIONS    1. Left ventricular ejection fraction, by visual estimation, is 45 to  50%. The left ventricle has mildly decreased function. There is no left  ventricular hypertrophy.  2. Severe hypokinesis of the left ventricular, basal-mid inferior wall.  3. Elevated left atrial pressure.  4. Left ventricular diastolic parameters are consistent with Grade II  diastolic dysfunction (pseudonormalization).  5. Global right ventricle has normal systolic function.The right  ventricular size is not well visualized. Right vetricular wall thickness  was not assessed.  6. Left atrial size was normal.  7. Right atrial size was normal.  8. Trivial pericardial effusion is present.  9. The  mitral valve is normal in structure. Trivial mitral valve  regurgitation.  10. The tricuspid valve is normal in structure.  11. The aortic valve is normal in structure. Aortic valve regurgitation is  not visualized. Mild to moderate aortic valve sclerosis/calcification  without any evidence of aortic stenosis.  12. The pulmonic valve was not well visualized. Pulmonic valve  regurgitation is not visualized.  13. Normal pulmonary artery systolic pressure.  14. The inferior vena cava is normal in size with greater than 50%  respiratory variability, suggesting right atrial pressure of 3 mmHg.   Echocardiogram: 11/2019 IMPRESSIONS    1. Left ventricular ejection fraction, by estimation, is 40%. The left  ventricle has moderately decreased function. The left ventricle  demonstrates global hypokinesis. Left ventricular diastolic parameters are  consistent with Grade II diastolic  dysfunction (pseudonormalization).  2. Right ventricular systolic function is normal. The right ventricular  size is normal. There is moderately elevated pulmonary artery systolic  pressure.  3. The mitral valve is grossly normal. Mild mitral valve regurgitation.  4. Morphologically, there appears to be mild aortic stenosis. The aortic  valve is tricuspid. Aortic valve regurgitation is not visualized.  5. The inferior vena cava is normal in size with greater than 50%  respiratory variability, suggesting right atrial pressure of 3 mmHg.   Laboratory Data:  Chemistry Recent Labs  Lab 12/09/19 1115 12/10/19 0613  NA 133* 134*  K 4.1 4.0  CL 104 104  CO2 20* 20*  GLUCOSE 107* 109*  BUN 32* 45*  CREATININE 2.05* 2.43*  CALCIUM 9.6 9.3  GFRNONAA 32* 26*  GFRAA 37* 30*  ANIONGAP 9 10    Recent Labs  Lab 12/09/19 1115  PROT 6.6  ALBUMIN 2.9*  AST 31  ALT 13  ALKPHOS 76  BILITOT 0.4   Hematology Recent Labs  Lab 12/09/19 1115 12/10/19 0613  WBC 7.9 7.0  RBC 2.79* 2.54*  HGB 8.8* 8.0*   HCT 27.6* 25.1*  MCV 98.9 98.8  MCH 31.5 31.5  MCHC 31.9 31.9  RDW 14.8 14.6  PLT 456* 419*   Cardiac EnzymesNo results for input(s): TROPONINI in the last 168 hours. No results for input(s): TROPIPOC in the last 168 hours.  BNP Recent Labs  Lab 12/09/19 1419  BNP 3,459.0*    DDimer No results for input(s): DDIMER in the last 168 hours.  Radiology/Studies:  DG Chest Portable 1 View  Result Date: 12/09/2019 CLINICAL DATA:  EMS reports pt is a residen at [facility]. Reports sob and cp since last night. Per Pts chart Pt has had COVID - unsure when. Hx A-fib, stroke, smoker EXAM: PORTABLE CHEST 1 VIEW COMPARISON:  09/15/2019 FINDINGS: Lungs density interstitial hazy airspace lung opacities, with more confluent opacity in the right lung base, obscuring the hemidiaphragm. Cardiac silhouette is normal in size.  Mediastinal hilar masses. Probable pleural effusions, larger on the right.  No pneumothorax. IMPRESSION: 1. Interstitial and hazy airspace lung opacities bilaterally with more confluent opacity noted in the right lung base. Findings may reflect multifocal pneumonia or pulmonary edema. Suspect right lung base opacity due to a combination of pleural fluid with either atelectasis or more confluent infection. Electronically Signed   By: Lajean Manes M.D.   On: 12/09/2019 11:34   ECHOCARDIOGRAM COMPLETE  Result Date: 12/09/2019    ECHOCARDIOGRAM REPORT   Patient Name:   Anthony Andrews Date of Exam: 12/09/2019 Medical Rec #:  [id number]   Height:  63.0 in Accession #:    [id number]  Weight:       125.0 lb Date of Birth:  1945/10/01  BSA:          1.584 m Patient Age:    28 years    BP:           140/78 mmHg Patient Gender: M           HR:           90 bpm. Exam Location:  [facility] Procedure: 2D Echo Indications:    Dyspnea 786.09 / R06.00  History:        Patient has prior history of Echocardiogram examinations, most                 recent 09/17/2019. Stroke, Arrythmias:Atrial Fibrillation;  Risk                 Factors:Current Smoker. Rhabdomyolysis, Pneumonia.  Sonographer:    Leavy Cella RDCS (AE) Referring Phys: [id number] COURAGE EMOKPAE IMPRESSIONS  1. Left ventricular ejection fraction, by estimation, is 40%. The left ventricle has moderately decreased function. The left ventricle demonstrates global hypokinesis. Left ventricular diastolic parameters are consistent with Grade II diastolic dysfunction (pseudonormalization).  2. Right ventricular systolic function is normal. The right ventricular size is normal. There is moderately elevated pulmonary artery systolic pressure.  3. The mitral valve is grossly normal. Mild mitral valve regurgitation.  4. Morphologically, there appears to be mild aortic stenosis. The aortic valve is tricuspid. Aortic valve regurgitation is not visualized.  5. The inferior vena cava is normal in size with greater than 50% respiratory variability, suggesting right atrial pressure of 3 mmHg. FINDINGS  Left Ventricle: Left ventricular ejection fraction, by estimation, is 40%. The left ventricle has moderately decreased function. The left ventricle demonstrates global hypokinesis. The left ventricular internal cavity size was normal in size. There is no left ventricular hypertrophy. Left ventricular diastolic parameters are consistent with Grade II diastolic dysfunction (pseudonormalization). Indeterminate filling pressures. Right Ventricle: The right ventricular size is normal. No increase in right ventricular wall thickness. Right ventricular systolic function is normal. There is moderately elevated pulmonary artery systolic pressure. The tricuspid regurgitant velocity is 3.08 m/s, and with an assumed right atrial pressure of 10 mmHg, the estimated right ventricular systolic pressure is 90.2 mmHg. Left Atrium: Left atrial size was normal in size. Right Atrium: Right atrial size was normal in size. Pericardium: There is no evidence of pericardial effusion. Mitral Valve:  The mitral valve is grossly normal. Mild mitral valve regurgitation. Tricuspid Valve: The tricuspid valve is grossly normal. Tricuspid valve regurgitation is mild. Aortic Valve: Morphologically, there appears to be mild aortic stenosis. The aortic valve is tricuspid. . There is mild thickening and mild calcification of the aortic valve. Aortic valve regurgitation is not visualized. Mild aortic valve annular calcification. There is mild thickening of the aortic valve. There is mild calcification of the aortic valve. Pulmonic Valve: The pulmonic valve was grossly normal. Pulmonic valve regurgitation is not visualized. Aorta: The aortic root is normal in size and structure. Venous: The inferior vena cava is normal in size with greater than 50% respiratory variability, suggesting right atrial pressure of 3 mmHg. IAS/Shunts: No atrial level shunt detected by color flow Doppler.  LEFT VENTRICLE PLAX 2D LVIDd:         4.76 cm  Diastology LVIDs:         4.10 cm  LV e' lateral:   6.96 cm/s LV  PW:         0.84 cm  LV E/e' lateral: 9.4 LV IVS:        0.79 cm  LV e' medial:    5.98 cm/s LVOT diam:     2.10 cm  LV E/e' medial:  11.0 LVOT Area:     3.46 cm  RIGHT VENTRICLE RV S prime:     11.60 cm/s TAPSE (M-mode): 1.7 cm LEFT ATRIUM             Index       RIGHT ATRIUM           Index LA diam:        4.00 cm 2.53 cm/m  RA Area:     13.90 cm LA Vol (A2C):   74.9 ml 47.30 ml/m RA Volume:   37.30 ml  23.55 ml/m LA Vol (A4C):   42.6 ml 26.90 ml/m LA Biplane Vol: 56.1 ml 35.43 ml/m   AORTA Ao Root diam: 2.80 cm MITRAL VALVE               TRICUSPID VALVE MV Area (PHT): 2.91 cm    TR Peak grad:   37.9 mmHg MV Decel Time: 261 msec    TR Vmax:        308.00 cm/s MR Peak grad: 42.2 mmHg MR Mean grad: 29.0 mmHg    SHUNTS MR Vmax:      325.00 cm/s  Systemic Diam: 2.10 cm MR Vmean:     254.0 cm/s MV E velocity: 65.50 cm/s MV A velocity: 41.70 cm/s MV E/A ratio:  1.57 Kate Sable MD Electronically signed by Kate Sable MD  Signature Date/Time: 12/09/2019/3:20:59 PM    Final      Assessment & Plan    1. Acute on Chronic Combined Systolic and Diastolic CHF  - EF previously 45-50% by echo in 08/2019, at 40% by repeat imaging this admission with Grade 2 DD and global HK.  - his symptoms of dyspnea on exertion, orthopnea and edema seem most consistent with volume overload. Was not on diuretic therapy prior to admission. Baseline weight of 125 lbs per his report, at 132 lbs today. -  BNP 3459 and CXR showed interstitial and hazy airspace lung opacities bilaterally with more confluent opacity noted in the right lung base and findings thought to be consistent with multifocal PNA or pulmonary edema. - he received both IV Lasix 60mg  and another 40mg  of Lasix yesterday evening and I suspect this contributed to his bump in creatinine from 2.05 to 2.43. Agree with IV Lasix 40mg  BID today. Repeat BMET in AM and if renal function has worsened, would need Nephrology consult. Follow I&O's along with daily weights.  - continue Coreg 3.125mg  BID. No ACE-I/ARB/ARNI at this time given his AKI.   2. Elevated Troponin Values - The patient reports having dyspnea on exertion but denies any recent chest pain.  - Initial HS Troponin 4269 with repeat of 4848. Initial EKG shows NSR, HR 86 with PVC's and ST depression along V5 and V6 which improved on repeat tracings. Repeat echocardiogram did show his EF is slightly more reduced as compared to prior imaging with EF now at 40%. - He would ultimately benefit from a [facility] but given his current renal function, he at high-risk for contrast induced nephropathy. Could also consider an outpatient Lexiscan but given his prior 2nd degree AV block, this is not ideal but thankfully he has not had any recurrence on his outpatient monitor or  inpatient telemetry thus far.  - remains on Heparin for now. Continue ASA, statin and BB therapy.   3. Paroxysmal Atrial Fibrillation - He has maintained normal sinus  rhythm this admission. He was on once daily Lopressor prior to admission and has been switched to Coreg 3.125 mg twice daily in the setting of his cardiomyopathy. - On Eliquis 5 mg twice daily prior to admission which is currently held given his elevated troponin values.  Continue heparin for now.  4. HTN - BP has been variable at 109/66 - 149/113 within the past 24 hours. Continue Coreg 3.125mg  BID.   5. Carotid Artery Stenosis - dopplers in 08/2019 showed 40-59% RICA and 21-30% LICA stenosis. Continue to follow as an outpatient. Remains on ASA and statin therapy.   6. Acute on Chronic Stage 3 CKD - baseline creatinine 1.2 - 1.3. At 2.05 on admission, at 2.43 today. Follow renal function closely. If creatinine continues to worsen with diuresis, would recommend Nephrology consult.   7. Anemia - Hgb 8.8 on admission which is close to baseline. He denies any evidence of active bleeding.    For questions or updates, please contact [place] Please consult [email or web address] for contact info under Cardiology/STEMI.  Signed, Erma Heritage, PA-C 12/10/2019, 9:01 AM Pager: [phone]  The patient was seen and examined, and I agree with the history, physical exam, assessment and plan as documented above, with modifications made above and as noted below. I have also personally reviewed all relevant documentation, old records, labs, and both radiographic and cardiovascular studies. I have also independently interpreted old and new ECG's.  Briefly, this is a 74 year old male with a history of carotid artery stenosis, hypertension, hyperlipidemia, cardiomyopathy, chronic anemia, previous CVA, and paroxysmal atrial fibrillation whom cardiology is asked to evaluate for volume overload and elevated troponins.  He was most recently seen in the cardiology office on 11/17/2019 and complained of intermittent dizziness.  He was not having chest pain or shortness of breath at that time.  A 30-day event  monitor was ordered.  He presented to [facility] ED on 12/09/2019 with progressive shortness of breath over the previous 2 days.  When I spoke with him he denied chest pain.  He said he is breathing better today.  He was noted to be orthopneic with mild leg swelling.  He denies palpitations.  He moved here from [place] 2 years ago to be near his son.  He is a retired Airline pilot. He smokes 3 cigarettes daily.  Labs reviewed above with peak troponin of 4848 which is down to 3050 today. BNP markedly elevated at 3459. Hemoglobin 8, creatinine 2.05 on 12/09/2019 and 2.43 today.  It had been in the 1.3 range in late December 2020.  Impression reviewed several ECGs, the most recent one demonstrating sinus rhythm with PVCs.  Initial ECG demonstrated sinus rhythm with nonspecific ST segment abnormalities in leads V5 and V6.  He received a total of 100 mg of IV Lasix on 12/09/2019.  I personally reviewed the echocardiogram performed yesterday which demonstrated LVEF 40% with grade 2 diastolic dysfunction and global hypokinesis.  There is mild aortic stenosis and mild mitral regurgitation.  Recommendations: Continue IV Lasix 40 mg twice daily for acute on chronic combined heart failure with close monitoring of renal function.  Continue carvedilol (switched from Lopressor given cardiomyopathy).  Unable to use ACE inhibitor's/angiotensin receptor blockers due to acute kidney injury.  With respect elevated troponins, continue IV heparin for now.  Given  slight drop in LVEF to 40%, he would ultimately benefit from right and left heart catheterization and coronary angiography, but not at this time given his acute kidney injury.  Treat medically for now with aspirin, statin, and beta-blocker.  If renal function improves within the next several days, a transfer to [facility] for cardiac catheterization could be considered.  Otherwise, it will need to be arranged in the outpatient setting.  With  respect to paroxysmal atrial fibrillation, he continues to remain in sinus rhythm.  Continue IV heparin and beta-blocker for now.  He had been on Eliquis prior to admission.      Kate Sable, MD, [facility]  12/10/2019 10:08 AM

## 2019-12-10 NOTE — Progress Notes (Signed)
ANTICOAGULATION CONSULT NOTE -   Pharmacy Consult for heparin  Indication: chest pain/ACS  No Known Allergies  Patient Measurements: Height: 5\' 3"  (160 cm) Weight: 132 lb 0.9 oz (59.9 kg) IBW/kg (Calculated) : 56.9 Heparin Dosing Weight: 57 kg  Vital Signs: Temp: 98.6 F (37 C) (03/18 1423) Temp Source: Oral (03/18 1423) BP: 102/64 (03/18 1423) Pulse Rate: 87 (03/18 1423)  Labs: Recent Labs    12/09/19 1115 12/09/19 1442 12/09/19 1749 12/10/19 0613 12/10/19 1502  HGB 8.8*  --   --  8.0*  --   HCT 27.6*  --   --  25.1*  --   PLT 456*  --   --  419*  --   APTT  --   --  <20* 78* 54*  LABPROT  --   --  <10.0*  --   --   INR  --   --  NOT CALCULATED  --   --   HEPARINUNFRC  --   --  >2.20* >2.20*  --   CREATININE 2.05*  --   --  2.43*  --   TROPONINIHS 4,269* 2,353*  --  3,050*  --     Estimated Creatinine Clearance: 22.4 mL/min (A) (by C-G formula based on SCr of 2.43 mg/dL (H)).   Medical History: Past Medical History:  Diagnosis Date  . Acute metabolic encephalopathy   . AKI (acute kidney injury) ([place])   . Atrial fibrillation ([place])   . Chronic hyponatremia   . COVID-19   . Dysphagia   . Essential hypertension   . Hyponatremia   . Kidney failure   . Respiratory failure ([place])   . Rhabdomyolysis   . Stroke ([place])   . Tobacco use     Medications:  Medications Prior to Admission  Medication Sig Dispense Refill Last Dose  . acetaminophen (TYLENOL) 325 MG tablet Take 650 mg by mouth 3 (three) times daily.   12/09/2019 at Unknown time  . apixaban (ELIQUIS) 5 MG TABS tablet Take 1 tablet (5 mg total) by mouth 2 (two) times daily. 60 tablet 0 12/09/2019 at 0800  . aspirin EC 81 MG tablet Take 81 mg by mouth every morning.   12/09/2019 at Unknown time  . cholecalciferol (VITAMIN D3) 25 MCG (1000 UT) tablet Take 1,000 Units by mouth daily.   12/09/2019 at Unknown time  . famotidine (PEPCID) 20 MG tablet Take 20 mg by mouth daily.   12/09/2019 at Unknown time  .  loratadine (CLARITIN) 10 MG tablet Take 10 mg by mouth daily.   12/09/2019 at Unknown time  . metoprolol tartrate (LOPRESSOR) 25 MG tablet Take 0.5 tablets (12.5 mg total) by mouth daily. 15 tablet 0 12/09/2019 at 0800  . PARoxetine (PAXIL) 20 MG tablet Take 20 mg by mouth daily.    12/09/2019 at Unknown time  . rosuvastatin (CRESTOR) 10 MG tablet Take 10 mg by mouth at bedtime.   12/08/2019 at Unknown time  . sodium bicarbonate 650 MG tablet Take 650 mg by mouth daily.   12/09/2019 at Unknown time  . tamsulosin (FLOMAX) 0.4 MG CAPS capsule Take 0.4 mg by mouth daily.   12/09/2019 at Unknown time  . albuterol (VENTOLIN HFA) 108 (90 Base) MCG/ACT inhaler Inhale 2 puffs into the lungs every 4 (four) hours as needed for wheezing or shortness of breath.    unknown   Scheduled:  . acetaminophen  650 mg Oral TID  . aspirin EC  81 mg Oral Q breakfast  . carvedilol  3.125 mg Oral BID WC  . cholecalciferol  1,000 Units Oral Daily  . doxycycline  100 mg Oral Q12H  . famotidine  20 mg Oral Daily  . furosemide  40 mg Intravenous BID  . guaiFENesin  600 mg Oral BID  . loratadine  10 mg Oral Daily  . PARoxetine  20 mg Oral Daily  . rosuvastatin  20 mg Oral QPM  . sodium bicarbonate  650 mg Oral BID  . sodium chloride flush  3 mL Intravenous Q12H   Infusions:  . sodium chloride    . cefTRIAXone (ROCEPHIN)  IV 1 g (12/10/19 1427)  . heparin 850 Units/hr (12/09/19 2011)    Assessment: Pt with multiple medical hx including CVA who was admitted for CP. He has been on apixaban for his CVA/afib. It was last taken 3/17 at 0800. IV heparin has been ordered to r/o MI. Will start IV heparin tonight after 12 hr from first dose. His heparin level will be elevated due to apixaban so we will use APTT for monitoring until it's correlated.   HL > 2.20 APTT 54- subtherapeutic  Goal of Therapy:  Heparin level 0.3-0.7 units/ml  APTT 66-102 Monitor platelets by anticoagulation protocol: Yes   Plan:  Increase heparin  infusion to 1000 units/hr  Confirmatory level in 8 hours. Daily PTT and HL  Margot Ables, PharmD Clinical Pharmacist 12/10/2019 4:16 PM

## 2019-12-10 NOTE — Care Management (Signed)
Patient is from [place] with medicaid pending. Will need new PT eval before new authorization can be started with [facility] per [place]. Attending notified to order PT eval when appropriate.

## 2019-12-11 ENCOUNTER — Inpatient Hospital Stay (HOSPITAL_COMMUNITY): Payer: Medicare PPO

## 2019-12-11 DIAGNOSIS — R0600 Dyspnea, unspecified: Secondary | ICD-10-CM

## 2019-12-11 LAB — BASIC METABOLIC PANEL
Anion gap: 10 (ref 5–15)
BUN: 46 mg/dL — ABNORMAL HIGH (ref 8–23)
CO2: 22 mmol/L (ref 22–32)
Calcium: 9.3 mg/dL (ref 8.9–10.3)
Chloride: 102 mmol/L (ref 98–111)
Creatinine, Ser: 2.55 mg/dL — ABNORMAL HIGH (ref 0.61–1.24)
GFR calc Af Amer: 28 mL/min — ABNORMAL LOW (ref >60)
GFR calc non Af Amer: 24 mL/min — ABNORMAL LOW (ref >60)
Glucose, Bld: 92 mg/dL (ref 70–99)
Potassium: 3.3 mmol/L — ABNORMAL LOW (ref 3.5–5.1)
Sodium: 134 mmol/L — ABNORMAL LOW (ref 135–145)

## 2019-12-11 LAB — APTT
aPTT: 116 seconds — ABNORMAL HIGH (ref 24–36)
aPTT: 71 seconds — ABNORMAL HIGH (ref 24–36)
aPTT: 97 seconds — ABNORMAL HIGH (ref 24–36)

## 2019-12-11 LAB — CBC
HCT: 24.5 % — ABNORMAL LOW (ref 39.0–52.0)
Hemoglobin: 7.9 g/dL — ABNORMAL LOW (ref 13.0–17.0)
MCH: 31.3 pg (ref 26.0–34.0)
MCHC: 32.2 g/dL (ref 30.0–36.0)
MCV: 97.2 fL (ref 80.0–100.0)
Platelets: 408 10*3/uL — ABNORMAL HIGH (ref 150–400)
RBC: 2.52 MIL/uL — ABNORMAL LOW (ref 4.22–5.81)
RDW: 14.6 % (ref 11.5–15.5)
WBC: 7.9 10*3/uL (ref 4.0–10.5)
nRBC: 0 % (ref 0.0–0.2)

## 2019-12-11 LAB — ECHOCARDIOGRAM LIMITED
Height: 63 in
Weight: 1913.6 oz

## 2019-12-11 LAB — BRAIN NATRIURETIC PEPTIDE: B Natriuretic Peptide: 1010 pg/mL — ABNORMAL HIGH (ref 0.0–100.0)

## 2019-12-11 MED ORDER — ISOSORBIDE MONONITRATE ER 30 MG PO TB24
15.0000 mg | ORAL_TABLET | Freq: Every day | ORAL | Status: DC
  Administered 2019-12-11 – 2019-12-21 (×11): 15 mg via ORAL
  Filled 2019-12-11 (×11): qty 1

## 2019-12-11 MED ORDER — POTASSIUM CHLORIDE CRYS ER 20 MEQ PO TBCR
40.0000 meq | EXTENDED_RELEASE_TABLET | ORAL | Status: AC
  Administered 2019-12-11 (×2): 40 meq via ORAL
  Filled 2019-12-11 (×2): qty 2

## 2019-12-11 MED ORDER — POTASSIUM CHLORIDE CRYS ER 20 MEQ PO TBCR
40.0000 meq | EXTENDED_RELEASE_TABLET | Freq: Once | ORAL | Status: AC
  Administered 2019-12-11: 40 meq via ORAL
  Filled 2019-12-11: qty 4

## 2019-12-11 MED ORDER — PERFLUTREN LIPID MICROSPHERE
1.0000 mL | INTRAVENOUS | Status: AC | PRN
  Administered 2019-12-11: 14:00:00 1 mL via INTRAVENOUS
  Filled 2019-12-11: qty 10

## 2019-12-11 MED ORDER — MAGNESIUM SULFATE 2 GM/50ML IV SOLN
2.0000 g | Freq: Once | INTRAVENOUS | Status: AC
  Administered 2019-12-11: 2 g via INTRAVENOUS
  Filled 2019-12-11: qty 50

## 2019-12-11 MED ORDER — ALUM & MAG HYDROXIDE-SIMETH 200-200-20 MG/5ML PO SUSP
30.0000 mL | Freq: Four times a day (QID) | ORAL | Status: DC | PRN
  Administered 2019-12-11: 30 mL via ORAL
  Filled 2019-12-11: qty 30

## 2019-12-11 MED ORDER — HYDRALAZINE HCL 25 MG PO TABS
25.0000 mg | ORAL_TABLET | Freq: Three times a day (TID) | ORAL | Status: DC
  Administered 2019-12-11 – 2019-12-14 (×10): 25 mg via ORAL
  Filled 2019-12-11 (×10): qty 1

## 2019-12-11 NOTE — Plan of Care (Signed)
  Problem: Acute Rehab PT Goals(only PT should resolve) Goal: Pt Will Go Supine/Side To Sit Outcome: Progressing Flowsheets (Taken 12/11/2019 0933) Pt will go Supine/Side to Sit: with supervision Goal: Pt Will Go Sit To Supine/Side Outcome: Progressing Flowsheets (Taken 12/11/2019 0933) Pt will go Sit to Supine/Side: with supervision Goal: Patient Will Transfer Sit To/From Stand Outcome: Progressing Flowsheets (Taken 12/11/2019 0933) Patient will transfer sit to/from stand: with supervision Goal: Pt Will Transfer Bed To Chair/Chair To Bed Outcome: Progressing Flowsheets (Taken 12/11/2019 0933) Pt will Transfer Bed to Chair/Chair to Bed: min guard assist Goal: Pt Will Ambulate Outcome: Progressing Flowsheets (Taken 12/11/2019 0933) Pt will Ambulate:  25 feet  with supervision  with rolling walker   9:35 AM, 12/11/19 Mearl Latin PT, DPT Physical Therapist at [facility]

## 2019-12-11 NOTE — Progress Notes (Signed)
Repeat echo with contrast supports LVEF of 40%. CXR with some suggestion of intersitial edema, suggets RLL pneumonia. Standing weight today was 119 lbs. From notes admitted at 132 lbs, baseline weight is 125 lbs based on 10/2019 and 119 lbs in 08/2019. Cr and BUN have been trending up with diuersis. On echo IVC is normal in size and collapse suggesting normal RA pressure. BNP remains elevated though down from 3500 to 1000. He appears euvolemic by exam    Somewhat mixed data regarding volume status however the majority of the evidence would suggest he is euvolemic or near euvolemic.Would d/c IV lasix. Depending on Cr tomorrow could start oral lasix or give a 1-2 day diuretic holiday. Ongoing SOB could be related to possible pneumonia on CXR, he also has COPD and is on bronchodilators. I would not aggresively diurese any further    Carlyle Dolly MD

## 2019-12-11 NOTE — Care Management Important Message (Signed)
Important Message  Patient Details  Name: Anthony Andrews MRN: [id number] Date of Birth: Feb 11, 1946   Medicare Important Message Given:  Yes     [name] 12/11/2019, 2:34 PM

## 2019-12-11 NOTE — Progress Notes (Signed)
*  PRELIMINARY RESULTS* Echocardiogram 2D Echocardiogram LIMITED has been performed.  Leavy Cella 12/11/2019, 2:58 PM

## 2019-12-11 NOTE — Progress Notes (Signed)
Progress Note  Patient Name: Anthony Andrews Date of Encounter: 12/11/2019  Primary Cardiologist: Quay Burow, MD   Subjective   Ongoing SOB.   Inpatient Medications    Scheduled Meds: . acetaminophen  650 mg Oral TID  . aspirin EC  81 mg Oral Q breakfast  . carvedilol  3.125 mg Oral BID WC  . cholecalciferol  1,000 Units Oral Daily  . doxycycline  100 mg Oral Q12H  . famotidine  20 mg Oral Daily  . furosemide  40 mg Intravenous BID  . guaiFENesin  600 mg Oral BID  . ipratropium-albuterol  3 mL Nebulization Q6H  . loratadine  10 mg Oral Daily  . PARoxetine  20 mg Oral Daily  . rosuvastatin  20 mg Oral QPM  . sodium bicarbonate  650 mg Oral BID  . sodium chloride flush  3 mL Intravenous Q12H   Continuous Infusions: . sodium chloride    . cefTRIAXone (ROCEPHIN)  IV 1 g (12/10/19 1427)  . heparin 900 Units/hr (12/11/19 0059)   PRN Meds: sodium chloride, acetaminophen **OR** acetaminophen, albuterol, ondansetron **OR** ondansetron (ZOFRAN) IV, polyethylene glycol, sodium chloride flush, traZODone   Vital Signs    Vitals:   12/10/19 2119 12/11/19 0104 12/11/19 0537 12/11/19 0729  BP: 114/76  (!) 145/96   Pulse: 83  79   Resp: 18  18   Temp: 97.7 F (36.5 C)  97.8 F (36.6 C)   TempSrc: Oral  Oral   SpO2: 100% 98% 99% 94%  Weight:   54.4 kg   Height:        Intake/Output Summary (Last 24 hours) at 12/11/2019 0915 Last data filed at 12/11/2019 0900 Gross per 24 hour  Intake 606.05 ml  Output 1850 ml  Net -1243.95 ml   Last 3 Weights 12/11/2019 12/10/2019 12/09/2019  Weight (lbs) 119 lb 14.9 oz 132 lb 0.9 oz 124 lb 9 oz  Weight (kg) 54.4 kg 59.9 kg 56.5 kg      Telemetry    NSR - Personally Reviewed  ECG    n/a - Personally Reviewed  Physical Exam   GEN: No acute distress.   Neck: No JVD Cardiac: RRR, no murmurs, rubs, or gallops.  Respiratory: Clear to auscultation bilaterally. GI: Soft, nontender, non-distended  MS: No edema; No  deformity. Neuro:  Nonfocal  Psych: Normal affect   Labs    High Sensitivity Troponin:   Recent Labs  Lab 12/09/19 1115 12/09/19 1442 12/10/19 0613  TROPONINIHS 4,269* 4,848* 3,050*      Chemistry Recent Labs  Lab 12/09/19 1115 12/10/19 0613  NA 133* 134*  K 4.1 4.0  CL 104 104  CO2 20* 20*  GLUCOSE 107* 109*  BUN 32* 45*  CREATININE 2.05* 2.43*  CALCIUM 9.6 9.3  PROT 6.6  --   ALBUMIN 2.9*  --   AST 31  --   ALT 13  --   ALKPHOS 76  --   BILITOT 0.4  --   GFRNONAA 32* 26*  GFRAA 37* 30*  ANIONGAP 9 10     Hematology Recent Labs  Lab 12/09/19 1115 12/10/19 0613 12/11/19 0609  WBC 7.9 7.0 7.9  RBC 2.79* 2.54* 2.52*  HGB 8.8* 8.0* 7.9*  HCT 27.6* 25.1* 24.5*  MCV 98.9 98.8 97.2  MCH 31.5 31.5 31.3  MCHC 31.9 31.9 32.2  RDW 14.8 14.6 14.6  PLT 456* 419* 408*    BNP Recent Labs  Lab 12/09/19 1419  BNP 3,459.0*  DDimer No results for input(s): DDIMER in the last 168 hours.   Radiology    DG Chest Portable 1 View  Result Date: 12/09/2019 CLINICAL DATA:  EMS reports pt is a residen at [facility] Reports sob and cp since last night. Per Pts chart Pt has had COVID - unsure when. Hx A-fib, stroke, smoker EXAM: PORTABLE CHEST 1 VIEW COMPARISON:  09/15/2019 FINDINGS: Lungs density interstitial hazy airspace lung opacities, with more confluent opacity in the right lung base, obscuring the hemidiaphragm. Cardiac silhouette is normal in size.  Mediastinal hilar masses. Probable pleural effusions, larger on the right.  No pneumothorax. IMPRESSION: 1. Interstitial and hazy airspace lung opacities bilaterally with more confluent opacity noted in the right lung base. Findings may reflect multifocal pneumonia or pulmonary edema. Suspect right lung base opacity due to a combination of pleural fluid with either atelectasis or more confluent infection. Electronically Signed   By: Lajean Manes M.D.   On: 12/09/2019 11:34   ECHOCARDIOGRAM COMPLETE  Result Date:  12/09/2019    ECHOCARDIOGRAM REPORT   Patient Name:   Anthony Andrews Date of Exam: 12/09/2019 Medical Rec #:  [id number]   Height:       63.0 in Accession #:    [id number]  Weight:       125.0 lb Date of Birth:  1946-08-17  BSA:          1.584 m Patient Age:    5 years    BP:           140/78 mmHg Patient Gender: M           HR:           90 bpm. Exam Location:  [facility] Procedure: 2D Echo Indications:    Dyspnea 786.09 / R06.00  History:        Patient has prior history of Echocardiogram examinations, most                 recent 09/17/2019. Stroke, Arrythmias:Atrial Fibrillation; Risk                 Factors:Current Smoker. Rhabdomyolysis, Pneumonia.  Sonographer:    Leavy Cella RDCS (AE) Referring Phys: [id number] COURAGE EMOKPAE IMPRESSIONS  1. Left ventricular ejection fraction, by estimation, is 40%. The left ventricle has moderately decreased function. The left ventricle demonstrates global hypokinesis. Left ventricular diastolic parameters are consistent with Grade II diastolic dysfunction (pseudonormalization).  2. Right ventricular systolic function is normal. The right ventricular size is normal. There is moderately elevated pulmonary artery systolic pressure.  3. The mitral valve is grossly normal. Mild mitral valve regurgitation.  4. Morphologically, there appears to be mild aortic stenosis. The aortic valve is tricuspid. Aortic valve regurgitation is not visualized.  5. The inferior vena cava is normal in size with greater than 50% respiratory variability, suggesting right atrial pressure of 3 mmHg. FINDINGS  Left Ventricle: Left ventricular ejection fraction, by estimation, is 40%. The left ventricle has moderately decreased function. The left ventricle demonstrates global hypokinesis. The left ventricular internal cavity size was normal in size. There is no left ventricular hypertrophy. Left ventricular diastolic parameters are consistent with Grade II diastolic dysfunction (pseudonormalization).  Indeterminate filling pressures. Right Ventricle: The right ventricular size is normal. No increase in right ventricular wall thickness. Right ventricular systolic function is normal. There is moderately elevated pulmonary artery systolic pressure. The tricuspid regurgitant velocity is 3.08 m/s, and with an assumed right atrial pressure of 10 mmHg, the estimated  right ventricular systolic pressure is 16.1 mmHg. Left Atrium: Left atrial size was normal in size. Right Atrium: Right atrial size was normal in size. Pericardium: There is no evidence of pericardial effusion. Mitral Valve: The mitral valve is grossly normal. Mild mitral valve regurgitation. Tricuspid Valve: The tricuspid valve is grossly normal. Tricuspid valve regurgitation is mild. Aortic Valve: Morphologically, there appears to be mild aortic stenosis. The aortic valve is tricuspid. . There is mild thickening and mild calcification of the aortic valve. Aortic valve regurgitation is not visualized. Mild aortic valve annular calcification. There is mild thickening of the aortic valve. There is mild calcification of the aortic valve. Pulmonic Valve: The pulmonic valve was grossly normal. Pulmonic valve regurgitation is not visualized. Aorta: The aortic root is normal in size and structure. Venous: The inferior vena cava is normal in size with greater than 50% respiratory variability, suggesting right atrial pressure of 3 mmHg. IAS/Shunts: No atrial level shunt detected by color flow Doppler.  LEFT VENTRICLE PLAX 2D LVIDd:         4.76 cm  Diastology LVIDs:         4.10 cm  LV e' lateral:   6.96 cm/s LV PW:         0.84 cm  LV E/e' lateral: 9.4 LV IVS:        0.79 cm  LV e' medial:    5.98 cm/s LVOT diam:     2.10 cm  LV E/e' medial:  11.0 LVOT Area:     3.46 cm  RIGHT VENTRICLE RV S prime:     11.60 cm/s TAPSE (M-mode): 1.7 cm LEFT ATRIUM             Index       RIGHT ATRIUM           Index LA diam:        4.00 cm 2.53 cm/m  RA Area:     13.90 cm LA Vol  (A2C):   74.9 ml 47.30 ml/m RA Volume:   37.30 ml  23.55 ml/m LA Vol (A4C):   42.6 ml 26.90 ml/m LA Biplane Vol: 56.1 ml 35.43 ml/m   AORTA Ao Root diam: 2.80 cm MITRAL VALVE               TRICUSPID VALVE MV Area (PHT): 2.91 cm    TR Peak grad:   37.9 mmHg MV Decel Time: 261 msec    TR Vmax:        308.00 cm/s MR Peak grad: 42.2 mmHg MR Mean grad: 29.0 mmHg    SHUNTS MR Vmax:      325.00 cm/s  Systemic Diam: 2.10 cm MR Vmean:     254.0 cm/s MV E velocity: 65.50 cm/s MV A velocity: 41.70 cm/s MV E/A ratio:  1.57 Kate Sable MD Electronically signed by Kate Sable MD Signature Date/Time: 12/09/2019/3:20:59 PM    Final     Cardiac Studies    Patient Profile     Anthony Andrews is a 74 y.o. male with past medical history of HTN, HLD, carotid artery stenosis, paroxysmal atrial fibrillation (diagnosed in 08/2019 with episodes of secondary AV block during admission), cardiomyopathy (EF 45-50% by echo in 08/2019), chronic anemia and prior CVA who is being seen today for the evaluation of NSTEMI at the request of Dr. Denton Brick.   Assessment & Plan    1. 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  - baseline weight 125 lbs, admitted 132 lbs and BNP 3459 and CXR consistent with edema - negative 1.4 L yesterday, I/Os incomplete from admission. Weights appear inaccurate. He is on lasix 40mg  IV bid, Cr up to 2.55 today.   Medical therapy with coreg 3.125mg  bid.  No ACE/ARB/ARNI/aldactone due to renal dysfunction.  Start hydral 25mg  tid, imdur 15mg  daily.   - repeat limited echo with contrast to verify LVEF, unclear if may be lower than 40%.  - repeat CXR, BNP. Obtain standing weight. By exam does not appear significnantly overloaded, unable to gauge adequacy of diuresis due to unreliable I/Os and weight data. F/u this additional data to further consider diuretic dosing.     2.  Elevated troponins - peak trop 4800 trending down. - echo with relative decrease in LVE to 40% - EKG nonspecific ST/T changes - no specific chest pains  - medical therapy for possible ACS with hep gtt, ASA 81, coreg 3.125mg  bid, crestor 20. No ACE/ARB/ARNI/aldactone due to renal dysfunction.   - would consider cath pending renal function and anemia, not a candidate at this time.    3. PAF - rate contorlled, has been on SR - eliquis on hold while on hep gtt  4. AKI on CKD 3 - baseline creatinine 1.2 - 1.3. At 2.05 on admission, at 2.55 today.    5 Hypokalemia - order KCl 108mEq x 1   6. Anemia  - per primary team, Hgb down to 7.9    For questions or updates, please contact [place] Please consult [email or web address] for contact info under        Signed, Carlyle Dolly, MD    12/11/2019, 9:15 AM

## 2019-12-11 NOTE — Progress Notes (Signed)
ANTICOAGULATION CONSULT NOTE   Pharmacy Consult for heparin  Indication: chest pain/ACS  No Known Allergies  Patient Measurements: Height: 5\' 3"  (160 cm) Weight: 132 lb 0.9 oz (59.9 kg) IBW/kg (Calculated) : 56.9 Heparin Dosing Weight: 57 kg  Vital Signs: Temp: 97.7 F (36.5 C) (03/18 2119) Temp Source: Oral (03/18 2119) BP: 114/76 (03/18 2119) Pulse Rate: 83 (03/18 2119)  Labs: Recent Labs    12/09/19 1115 12/09/19 1442 12/09/19 1749 12/09/19 1749 12/10/19 0613 12/10/19 1502 12/10/19 2350  HGB 8.8*  --   --   --  8.0*  --   --   HCT 27.6*  --   --   --  25.1*  --   --   PLT 456*  --   --   --  419*  --   --   APTT  --   --  <20*   < > 78* 54* 116*  LABPROT  --   --  <10.0*  --   --   --   --   INR  --   --  NOT CALCULATED  --   --   --   --   HEPARINUNFRC  --   --  >2.20*  --  >2.20*  --   --   CREATININE 2.05*  --   --   --  2.43*  --   --   TROPONINIHS 4,269* 6,213*  --   --  3,050*  --   --    < > = values in this interval not displayed.    Estimated Creatinine Clearance: 22.4 mL/min (A) (by C-G formula based on SCr of 2.43 mg/dL (H)).   Assessment: Pt with multiple medical hx including CVA who was admitted for CP. He has been on apixaban for his CVA/afib. It was last taken 3/17 at 0800. IV heparin has been ordered to r/o MI. Will start IV heparin tonight after 12 hr from first dose. His heparin level will be elevated due to apixaban so we will use APTT for monitoring until it's correlated.   PTT up to 116 sec (supratherapeutic). Lab drawn correctly from arm opposite where heparin running. No bleeding noted.  Goal of Therapy:  Heparin level 0.3-0.7 units/ml  APTT 66-102 Monitor platelets by anticoagulation protocol: Yes   Plan:  Decrease heparin infusion to 900 units/hr  Will f/u 8hr PTT  Sherlon Handing, PharmD, BCPS Please see amion for complete clinical pharmacist phone list 12/11/2019 12:57 AM

## 2019-12-11 NOTE — Evaluation (Signed)
Physical Therapy Evaluation Patient Details Name: Anthony Andrews MRN: [id number] DOB: 1946-07-02 Today's Date: 12/11/2019   History of Present Illness  Anthony Andrews  is a 74 y.o. male with PMHx of Prior CVA with mild residual left-sided weakness, HTN, ongoing tobacco abuse, PAF on chronic anticoagulation with Eliquis, chronic anemia, recurrent falls syncope and status post 30-day event monitor placed on 09/16/2020 who presents from [facility] with shortness of breath    Clinical Impression  Patient limited for functional mobility as stated below secondary to BLE weakness, fatigue and poor standing balance. Patient able to transition to seated EOB with minimal assistance. He becomes slightly dizzy upon initial standing which resolves with sitting. He is able to transition to standing with min assist and take several short, slow steps with use of RW. Patient demonstrates good sitting tolerance throughout session. Patient ended session seated in chair eating breakfast with hospital staff in room. Patient will benefit from continued physical therapy in hospital and recommended venue below to increase strength, balance, endurance for safe ADLs and gait.     Follow Up Recommendations SNF    Equipment Recommendations  None recommended by PT    Recommendations for Other Services       Precautions / Restrictions Precautions Precautions: Fall Restrictions Weight Bearing Restrictions: No      Mobility  Bed Mobility Overal bed mobility: Needs Assistance Bed Mobility: Supine to Sit;Sit to Supine     Supine to sit: Min guard;Min assist Sit to supine: Supervision   General bed mobility comments: to transition to seated EOB, slightly slow and labored  Transfers Overall transfer level: Needs assistance Equipment used: Rolling walker (2 wheeled) Transfers: Sit to/from [facility] Sit to Stand: Min guard;Min assist Stand pivot transfers: Min guard       General transfer  comment: using RW, slow and labored, some dizziness upon ititial standing, transfer to standing from bed x4  Ambulation/Gait Ambulation/Gait assistance: Min assist Gait Distance (Feet): 6 Feet Assistive device: Rolling walker (2 wheeled) Gait Pattern/deviations: Step-through pattern;Decreased step length - left;Decreased step length - right;Decreased stride length Gait velocity: decreased   General Gait Details: slow, labored gait with short steps using RW and assist for balance and safety  Stairs            Wheelchair Mobility    Modified Rankin (Stroke Patients Only)       Balance Overall balance assessment: Needs assistance   Sitting balance-Leahy Scale: Fair Sitting balance - Comments: seated EOB   Standing balance support: During functional activity;Bilateral upper extremity supported Standing balance-Leahy Scale: Fair Standing balance comment: using RW                             Pertinent Vitals/Pain Pain Assessment: No/denies pain    Home Living Family/patient expects to be discharged to:: Skilled nursing facility                      Prior Function Level of Independence: Needs assistance   Gait / Transfers Assistance Needed: household ambulator with RW  ADL's / Homemaking Assistance Needed: requires assist with ADL he states        Hand Dominance        Extremity/Trunk Assessment   Upper Extremity Assessment Upper Extremity Assessment: Generalized weakness    Lower Extremity Assessment Lower Extremity Assessment: Generalized weakness    Cervical / Trunk Assessment Cervical / Trunk Assessment: Normal  Communication  Communication: No difficulties  Cognition Arousal/Alertness: Awake/alert Behavior During Therapy: WFL for tasks assessed/performed Overall Cognitive Status: Within Functional Limits for tasks assessed                                        General Comments      Exercises      Assessment/Plan    PT Assessment Patient needs continued PT services  PT Problem List Decreased strength;Decreased activity tolerance;Decreased balance;Decreased mobility;Decreased knowledge of use of DME       PT Treatment Interventions DME instruction;Balance training;Gait training;Functional mobility training;Patient/family education;Therapeutic activities;Therapeutic exercise;Neuromuscular re-education    PT Goals (Current goals can be found in the Care Plan section)  Acute Rehab PT Goals Patient Stated Goal: Return to SNF PT Goal Formulation: With patient Time For Goal Achievement: 12/25/19 Potential to Achieve Goals: Good    Frequency Min 3X/week   Barriers to discharge        Co-evaluation               AM-PAC PT "6 Clicks" Mobility  Outcome Measure Help needed turning from your back to your side while in a flat bed without using bedrails?: A Little Help needed moving from lying on your back to sitting on the side of a flat bed without using bedrails?: A Little Help needed moving to and from a bed to a chair (including a wheelchair)?: A Lot Help needed standing up from a chair using your arms (e.g., wheelchair or bedside chair)?: A Little Help needed to walk in hospital room?: A Lot Help needed climbing 3-5 steps with a railing? : Total 6 Click Score: 14    End of Session Equipment Utilized During Treatment: Gait belt;Oxygen Activity Tolerance: Patient tolerated treatment well;Patient limited by fatigue Patient left: in chair;with call bell/phone within reach;with chair alarm set;Other (comment)(with hospital staff in room) Nurse Communication: Mobility status PT Visit Diagnosis: Unsteadiness on feet (R26.81);Other abnormalities of gait and mobility (R26.89);Muscle weakness (generalized) (M62.81)    Time: 4917-9150 PT Time Calculation (min) (ACUTE ONLY): 27 min   Charges:   PT Evaluation $PT Eval Low Complexity: 1 Low PT Treatments $Therapeutic  Activity: 8-22 mins       9:32 AM, 12/11/19 Mearl Latin PT, DPT Physical Therapist at [facility]

## 2019-12-11 NOTE — Progress Notes (Signed)
[place] for heparin  Indication: chest pain/ACS  No Known Allergies  Patient Measurements: Height: 5\' 3"  (160 cm) Weight: 119 lb 14.9 oz (54.4 kg) IBW/kg (Calculated) : 56.9 Heparin Dosing Weight: 57 kg  Vital Signs: Temp: 97.8 F (36.6 C) (03/19 0537) Temp Source: Oral (03/19 0537) BP: 145/96 (03/19 0537) Pulse Rate: 79 (03/19 0537)  Labs: Recent Labs    12/09/19 1115 12/09/19 1115 12/09/19 1442 12/09/19 1749 12/10/19 2778 12/10/19 0613 12/10/19 1502 12/10/19 2350 12/11/19 0609 12/11/19 0849  HGB 8.8*   < >  --   --  8.0*  --   --   --  7.9*  --   HCT 27.6*  --   --   --  25.1*  --   --   --  24.5*  --   PLT 456*  --   --   --  419*  --   --   --  408*  --   APTT  --    < >  --  <20* 78*   < > 54* 116*  --  97*  LABPROT  --   --   --  <10.0*  --   --   --   --   --   --   INR  --   --   --  NOT CALCULATED  --   --   --   --   --   --   HEPARINUNFRC  --   --   --  >2.20* >2.20*  --   --   --   --   --   CREATININE 2.05*  --   --   --  2.43*  --   --   --   --  2.55*  TROPONINIHS 4,269*  --  2,423*  --  3,050*  --   --   --   --   --    < > = values in this interval not displayed.    Estimated Creatinine Clearance: 20.4 mL/min (A) (by C-G formula based on SCr of 2.55 mg/dL (H)).   Assessment: Pt with multiple medical hx including CVA who was admitted for CP. He has been on apixaban for his CVA/afib. It was last taken 3/17 at 0800. IV heparin has been ordered to r/o MI. Will start IV heparin tonight after 12 hr from first dose. His heparin level will be elevated due to apixaban so we will use APTT for monitoring until it's correlated.   PTT up to 97sec (therapeutic).   Goal of Therapy:  Heparin level 0.3-0.7 units/ml  APTT 66-102 Monitor platelets by anticoagulation protocol: Yes   Plan:  Continue heparin infusion at 900 units/hr  Confirmatory aPTT in 8 hours  Monitor daily heparin level/aPTT and s/s of  bleeding.  Margot Ables, PharmD Clinical Pharmacist 12/11/2019 9:25 AM

## 2019-12-11 NOTE — Progress Notes (Signed)
Patient Demographics:    Anthony Andrews, is a 74 y.o. male, DOB - [birth year], WUJ:[id number]  Admit date - 12/09/2019   Admitting Physician Anthony Shiflett Denton Brick, MD  Outpatient Primary MD for the patient is Anthony Renshaw, MD  LOS - 2   Chief Complaint  Patient presents with  . Shortness of Breath        Subjective:    [name] today has no fevers, no emesis,  No chest pain,   Orthopnea and dyspnea restless improved -Dyspnea on exertion persist  Assessment  & Plan :    Principal Problem:   Abnormal result of cardiovascular function study suggestive of non-ST elevation myocardial infarction (NSTEMI) Active Problems:   Acute respiratory failure with hypoxia (HCC)   Acute on chronic HFrEF (heart failure with reduced ejection fraction) (HCC)   AKI (acute kidney injury) on CKD IIIa   Atrial fibrillation (HCC)   Pneumonia   Non-ST elevation (NSTEMI) myocardial infarction (HCC)   Tobacco abuse   Acute on chronic combined systolic and diastolic CHF (congestive heart failure) (HCC)   PAF (paroxysmal atrial fibrillation) (HCC)   Essential hypertension   Bilateral carotid artery stenosis   Anemia   Stage 3 chronic kidney disease   Brief Summary 74 y.o. male with PMHx of Prior CVA with mild residual left-sided weakness, HTN, ongoing tobacco abuse, PAF on chronic anticoagulation with Eliquis, chronic anemia, recurrent falls syncope and status post 30-day event monitor placed on 09/16/2020 admitted on 12/09/2019 with concerns about possible NSTEMI and CHF exacerbation as well as possible superimposed right-sided pneumonia    A/p 1)Possible NSTEMI--- ???  Due to demand ischemia in the setting of CHF exacerbation --- on admission patient presented  with dyspnea, EKG with ST depression nonspecific ST and T wave changes -No chest pain, dyspnea improving, dyspnea on exertion persist - echo with EF down to 40% from  45 to 50% less than 3 months ago on 09/17/2019, echo today also shows global hypokinesis and grade 2 diastolic dysfunction, pulmonary hypertension -Troponin 4,269 >>  4,848 >>> 3,050 -Stop Eliquis, continue IV heparin started on 12/09/2019---cardiology service do not restart Eliquis on 2 decision on possible LHC has been made most likely on 12/14/2019 -Continue Coreg, Crestor and aspirin -Cardiology consult appreciated, patient may need RHC/LHC when renal function permits  2)HFrEF---  patient admitted with acute on chronic combined systolic and diastolic CHF exacerbation  --- Admission chest x-ray with possible pulmonary edema and pleural fluid -Repeat chest x-ray on 12/11/2019 continue to suggest CHF superimposed on emphysema and possible right-sided pneumonia -Repeat echo on 12/09/2018 as noted above #1 -Repeat echo on 12/11/2019 suggest EF of 40% without evidence of significant volume overload -BNP  3459 >> 1010 -Elevated troponins as above --Standing weight 119,  baseline weight 125  -Okay to stop IV Lasix, may consider oral Lasix after reevaluation in a.m. of 12/12/2019 ACE/ARB therapy was not prescribed due to Worsening renal function. -Monitor daily weights and fluid input and output accurately  3)AKI----acute kidney injury on CKD stage - IIIa-worsening renal function might be due to CHF exacerbation with decreased renal perfusion   creatinine on admission= 2.0 , baseline creatinine = 1.4 (09/21/2019)    , creatinine 92.4 most likely due to diuresis, -- renally adjust  medications, avoid nephrotoxic agents / dehydration  / hypotension --Continue sodium bicarb -Monitor renal function closely with diuresis for #2 above  4) chronic normocytic and normochromic anemia--- hemoglobin currently down to 7.9 from 8.8 on admission which is close to patient's baseline -No evidence of ongoing bleeding, PTA  the patient was on Eliquis and now  switched to IV heparin -Stool occult blood  requested  5) history of PAFib/history of second-degree AV block/history of tachyarrhythmia or bradyarrhythmia--- patient had a 30-day event monitor placed on 11/18/2019 -Preliminary review of his 30-day event monitor shows no significant arrhythmias -Continue Coreg, Eliquis stopped due to NSTEMI, IV heparin for now  6) history of COVID-19 infection--- resolved, asymptomatic, repeat Covid test negative at this time  7) history of prior stroke with residual left-sided hemiparesis----anticoagulation as above #1 and #5 -Continue statin and aspirin  8) possible Rt Sided pneumonia-----no fevers, no leukocytosis, patient with shortness of breath, some cough, admission chest x-ray findings with pneumonia versus pulmonary edema---  -Repeat chest x-ray on 12/11/2019 continues to suggest CHF superimposed on emphysema with possible concomitant right-sided pneumonia --Continue Rocephin and doxycycline started on 12/09/2019 and as well as mucolytics and bronchodilators for possible pneumonia  9)tobacco abuse--- smoking cessation strongly advised, patient is not ready to quit  10) acute hypoxic respiratory failure--- secondary to #1 and #2 above, continue supplemental oxygen, treat as above #1 and #2  11)Social/Ethics--- patient is a DNR/DNI, he is from [facility] where he has lived for over 6 months now  12)Generalized Weakness/deconditioning--PT eval appreciated recommends SNF rehab  Disposition/Need for in-Hospital Stay- patient unable to be discharged at this time due to --- acute CHF exacerbation requiring further  diuresis and IV heparin for possible NSTEMI -Anticipate discharge back to [facility] for discharge once acute medical issues resolved pending physical therapy eval -Not medically ready for discharge at this time  Code Status : DNR  Family Communication:    (patient is alert, awake and coherent) --- Plan of care discussed with patient's son  Consults  :  cardiology  DVT  Prophylaxis  : IV heparin  Lab Results  Component Value Date   PLT 408 (H) 12/11/2019    Inpatient Medications  Scheduled Meds: . acetaminophen  650 mg Oral TID  . aspirin EC  81 mg Oral Q breakfast  . carvedilol  3.125 mg Oral BID WC  . cholecalciferol  1,000 Units Oral Daily  . doxycycline  100 mg Oral Q12H  . famotidine  20 mg Oral Daily  . guaiFENesin  600 mg Oral BID  . hydrALAZINE  25 mg Oral TID  . ipratropium-albuterol  3 mL Nebulization Q6H  . isosorbide mononitrate  15 mg Oral Daily  . loratadine  10 mg Oral Daily  . PARoxetine  20 mg Oral Daily  . potassium chloride  40 mEq Oral Q3H  . rosuvastatin  20 mg Oral QPM  . sodium bicarbonate  650 mg Oral BID  . sodium chloride flush  3 mL Intravenous Q12H   Continuous Infusions: . sodium chloride    . cefTRIAXone (ROCEPHIN)  IV 1 g (12/11/19 1627)  . heparin 900 Units/hr (12/11/19 0059)  . magnesium sulfate bolus IVPB     PRN Meds:.sodium chloride, acetaminophen **OR** acetaminophen, albuterol, ondansetron **OR** ondansetron (ZOFRAN) IV, polyethylene glycol, sodium chloride flush, traZODone    Anti-infectives (From admission, onward)   Start     Dose/Rate Route Frequency Ordered Stop   12/10/19 1400  cefTRIAXone (ROCEPHIN) 1 g in sodium  chloride 0.9 % 100 mL IVPB     1 g 200 mL/hr over 30 Minutes Intravenous Every 24 hours 12/09/19 1741     12/09/19 2200  doxycycline (VIBRA-TABS) tablet 100 mg     100 mg Oral Every 12 hours 12/09/19 1741     12/09/19 1315  cefTRIAXone (ROCEPHIN) 1 g in sodium chloride 0.9 % 100 mL IVPB     1 g 200 mL/hr over 30 Minutes Intravenous  Once 12/09/19 1310 12/09/19 1445   12/09/19 1315  azithromycin (ZITHROMAX) 500 mg in sodium chloride 0.9 % 250 mL IVPB     500 mg 250 mL/hr over 60 Minutes Intravenous  Once 12/09/19 1310 12/09/19 1554        Objective:   Vitals:   12/11/19 1127 12/11/19 1333 12/11/19 1522 12/11/19 1611  BP: 137/69 114/61  121/61  Pulse: 68 73  70  Resp: 18  18    Temp: 98 F (36.7 C) 97.8 F (36.6 C)    TempSrc: Oral     SpO2: 100% 100% 99%   Weight:      Height:        Wt Readings from Last 3 Encounters:  12/11/19 54.3 kg  11/17/19 57.1 kg  10/01/19 85.3 kg     Intake/Output Summary (Last 24 hours) at 12/11/2019 1902 Last data filed at 12/11/2019 1800 Gross per 24 hour  Intake 1000.1 ml  Output 2000 ml  Net -999.9 ml   Physical Exam  Gen:- Awake Alert,  In no apparent distress  HEENT:- [place].AT, No sclera icterus Nose- [place] 2L/min Neck-Supple Neck,No JVD,.  Lungs-diminished in bases with faint bibasilar rales CV- S1, S2 normal, regular , anterior chest wall with cardiac event monitor  abd-  +ve B.Sounds, Abd Soft, No tenderness,    Extremity/Skin:- trace edema, pedal pulses present  Psych-affect is appropriate, oriented x3 Neuro-generalized weakness, no new focal deficits, no tremors   Data Review:   Micro Results Recent Results (from the past 240 hour(s))  Respiratory Panel by RT PCR (Flu A&B, Covid) - Nasopharyngeal Swab     Status: None   Collection Time: 12/09/19  2:21 PM   Specimen: Nasopharyngeal Swab  Result Value Ref Range Status   SARS Coronavirus 2 by RT PCR NEGATIVE NEGATIVE Final    Comment: (NOTE) SARS-CoV-2 target nucleic acids are NOT DETECTED. The SARS-CoV-2 RNA is generally detectable in upper respiratoy specimens during the acute phase of infection. The lowest concentration of SARS-CoV-2 viral copies this assay can detect is 131 copies/mL. A negative result does not preclude SARS-Cov-2 infection and should not be used as the sole basis for treatment or other patient management decisions. A negative result may occur with  improper specimen collection/handling, submission of specimen other than nasopharyngeal swab, presence of viral mutation(s) within the areas targeted by this assay, and inadequate number of viral copies (<131 copies/mL). A negative result must be combined with clinical observations,  patient history, and epidemiological information. The expected result is Negative. Fact Sheet for Patients:  PinkCheek.be Fact Sheet for Healthcare Providers:  GravelBags.it This test is not yet ap proved or cleared by the [place] FDA and  has been authorized for detection and/or diagnosis of SARS-CoV-2 by FDA under an Emergency Use Authorization (EUA). This EUA will remain  in effect (meaning this test can be used) for the duration of the COVID-19 declaration under Section 564(b)(1) of the Act, 21 U.S.C. section 360bbb-3(b)(1), unless the authorization is terminated or revoked sooner.  Influenza A by PCR NEGATIVE NEGATIVE Final   Influenza B by PCR NEGATIVE NEGATIVE Final    Comment: (NOTE) The Xpert Xpress SARS-CoV-2/FLU/RSV assay is intended as an aid in  the diagnosis of influenza from Nasopharyngeal swab specimens and  should not be used as a sole basis for treatment. Nasal washings and  aspirates are unacceptable for Xpert Xpress SARS-CoV-2/FLU/RSV  testing. Fact Sheet for Patients: PinkCheek.be Fact Sheet for Healthcare Providers: GravelBags.it This test is not yet approved or cleared by the [place] FDA and  has been authorized for detection and/or diagnosis of SARS-CoV-2 by  FDA under an Emergency Use Authorization (EUA). This EUA will remain  in effect (meaning this test can be used) for the duration of the  Covid-19 declaration under Section 564(b)(1) of the Act, 21  U.S.C. section 360bbb-3(b)(1), unless the authorization is  terminated or revoked. Performed at [facility], [address], [place],  [address]     Radiology Reports DG Chest 2 View  Result Date: 12/11/2019 CLINICAL DATA:  Shortness of breath with atrial fibrillation and hypertension EXAM: CHEST - 2 VIEW COMPARISON:  December 09, 2019 chest radiograph and chest CT September 15, 2020 FINDINGS: Underlying emphysematous change is noted, most severe in the right upper lobe. There is a right pleural effusion with airspace opacity in the right lower lobe. There is atelectatic change in the left base. Heart is mildly enlarged. There is mild interstitial edema. Heart is slightly enlarged with pulmonary vascularity reflecting the underlying emphysema. There is aortic atherosclerosis. There is a loop recorder anteriorly. There is postoperative change in the left medial shoulder region. IMPRESSION: Findings most consistent with congestive heart failure superimposed on emphysematous change. Cardiomegaly is present with interstitial edema. Right pleural effusion. There is suspected superimposed right lower lobe pneumonia. There is aortic atherosclerosis. Aortic Atherosclerosis (ICD10-I70.0) and Emphysema (ICD10-J43.9). Electronically Signed   By: Lowella Grip III M.D.   On: 12/11/2019 11:14   DG Chest Portable 1 View  Result Date: 12/09/2019 CLINICAL DATA:  EMS reports pt is a residen at [facility]. Reports sob and cp since last night. Per Pts chart Pt has had COVID - unsure when. Hx A-fib, stroke, smoker EXAM: PORTABLE CHEST 1 VIEW COMPARISON:  09/15/2019 FINDINGS: Lungs density interstitial hazy airspace lung opacities, with more confluent opacity in the right lung base, obscuring the hemidiaphragm. Cardiac silhouette is normal in size.  Mediastinal hilar masses. Probable pleural effusions, larger on the right.  No pneumothorax. IMPRESSION: 1. Interstitial and hazy airspace lung opacities bilaterally with more confluent opacity noted in the right lung base. Findings may reflect multifocal pneumonia or pulmonary edema. Suspect right lung base opacity due to a combination of pleural fluid with either atelectasis or more confluent infection. Electronically Signed   By: Lajean Manes M.D.   On: 12/09/2019 11:34   ECHOCARDIOGRAM COMPLETE  Result Date: 12/09/2019    ECHOCARDIOGRAM REPORT    Patient Name:   JAHKARI MACLIN Date of Exam: 12/09/2019 Medical Rec #:  [id number]   Height:       63.0 in Accession #:    [id number]  Weight:       125.0 lb Date of Birth:  05-23-1946  BSA:          1.584 m Patient Age:    57 years    BP:           140/78 mmHg Patient Gender: M           HR:  90 bpm. Exam Location:  [facility] Procedure: 2D Echo Indications:    Dyspnea 786.09 / R06.00  History:        Patient has prior history of Echocardiogram examinations, most                 recent 09/17/2019. Stroke, Arrythmias:Atrial Fibrillation; Risk                 Factors:Current Smoker. Rhabdomyolysis, Pneumonia.  Sonographer:    Leavy Cella RDCS (AE) Referring Phys: [id number] Edem Tiegs IMPRESSIONS  1. Left ventricular ejection fraction, by estimation, is 40%. The left ventricle has moderately decreased function. The left ventricle demonstrates global hypokinesis. Left ventricular diastolic parameters are consistent with Grade II diastolic dysfunction (pseudonormalization).  2. Right ventricular systolic function is normal. The right ventricular size is normal. There is moderately elevated pulmonary artery systolic pressure.  3. The mitral valve is grossly normal. Mild mitral valve regurgitation.  4. Morphologically, there appears to be mild aortic stenosis. The aortic valve is tricuspid. Aortic valve regurgitation is not visualized.  5. The inferior vena cava is normal in size with greater than 50% respiratory variability, suggesting right atrial pressure of 3 mmHg. FINDINGS  Left Ventricle: Left ventricular ejection fraction, by estimation, is 40%. The left ventricle has moderately decreased function. The left ventricle demonstrates global hypokinesis. The left ventricular internal cavity size was normal in size. There is no left ventricular hypertrophy. Left ventricular diastolic parameters are consistent with Grade II diastolic dysfunction (pseudonormalization). Indeterminate filling pressures. Right  Ventricle: The right ventricular size is normal. No increase in right ventricular wall thickness. Right ventricular systolic function is normal. There is moderately elevated pulmonary artery systolic pressure. The tricuspid regurgitant velocity is 3.08 m/s, and with an assumed right atrial pressure of 10 mmHg, the estimated right ventricular systolic pressure is 24.2 mmHg. Left Atrium: Left atrial size was normal in size. Right Atrium: Right atrial size was normal in size. Pericardium: There is no evidence of pericardial effusion. Mitral Valve: The mitral valve is grossly normal. Mild mitral valve regurgitation. Tricuspid Valve: The tricuspid valve is grossly normal. Tricuspid valve regurgitation is mild. Aortic Valve: Morphologically, there appears to be mild aortic stenosis. The aortic valve is tricuspid. . There is mild thickening and mild calcification of the aortic valve. Aortic valve regurgitation is not visualized. Mild aortic valve annular calcification. There is mild thickening of the aortic valve. There is mild calcification of the aortic valve. Pulmonic Valve: The pulmonic valve was grossly normal. Pulmonic valve regurgitation is not visualized. Aorta: The aortic root is normal in size and structure. Venous: The inferior vena cava is normal in size with greater than 50% respiratory variability, suggesting right atrial pressure of 3 mmHg. IAS/Shunts: No atrial level shunt detected by color flow Doppler.  LEFT VENTRICLE PLAX 2D LVIDd:         4.76 cm  Diastology LVIDs:         4.10 cm  LV e' lateral:   6.96 cm/s LV PW:         0.84 cm  LV E/e' lateral: 9.4 LV IVS:        0.79 cm  LV e' medial:    5.98 cm/s LVOT diam:     2.10 cm  LV E/e' medial:  11.0 LVOT Area:     3.46 cm  RIGHT VENTRICLE RV S prime:     11.60 cm/s TAPSE (M-mode): 1.7 cm LEFT ATRIUM  Index       RIGHT ATRIUM           Index LA diam:        4.00 cm 2.53 cm/m  RA Area:     13.90 cm LA Vol (A2C):   74.9 ml 47.30 ml/m RA  Volume:   37.30 ml  23.55 ml/m LA Vol (A4C):   42.6 ml 26.90 ml/m LA Biplane Vol: 56.1 ml 35.43 ml/m   AORTA Ao Root diam: 2.80 cm MITRAL VALVE               TRICUSPID VALVE MV Area (PHT): 2.91 cm    TR Peak grad:   37.9 mmHg MV Decel Time: 261 msec    TR Vmax:        308.00 cm/s MR Peak grad: 42.2 mmHg MR Mean grad: 29.0 mmHg    SHUNTS MR Vmax:      325.00 cm/s  Systemic Diam: 2.10 cm MR Vmean:     254.0 cm/s MV E velocity: 65.50 cm/s MV A velocity: 41.70 cm/s MV E/A ratio:  1.57 Kate Sable MD Electronically signed by Kate Sable MD Signature Date/Time: 12/09/2019/3:20:59 PM    Final    ECHOCARDIOGRAM LIMITED  Result Date: 12/11/2019    ECHOCARDIOGRAM LIMITED REPORT   Patient Name:   ANKIT DEGREGORIO Date of Exam: 12/11/2019 Medical Rec #:  [id number]   Height:       63.0 in Accession #:    [id number]  Weight:       119.6 lb Date of Birth:  07/28/1946  BSA:          1.554 m Patient Age:    61 years    BP:           114/81 mmHg Patient Gender: M           HR:           73 bpm. Exam Location:  [facility] Procedure: Limited Echo Indications:    Dyspnea 786.09 / R06.00  History:        Patient has prior history of Echocardiogram examinations, most                 recent 12/09/2019. Previous Myocardial Infarction,                 Arrythmias:Atrial Fibrillation; Risk Factors:Current Smoker and                 Hypertension. Rhabdomyolysis.  Sonographer:    Leavy Cella RDCS (AE) Referring Phys: [id number] [place]  1. Left ventricular ejection fraction, by estimation, is 40%. The left ventricle demonstrates global hypokinesis.  2. The inferior vena cava is normal in size with greater than 50% respiratory variability, suggesting right atrial pressure of 3 mmHg.  3. Limited echo to evaluate LV function. Echocontrast was used to enhance visualization. FINDINGS  Left Ventricle: Left ventricular ejection fraction, by estimation, is 40%. The left ventricle demonstrates global hypokinesis.  Definity contrast agent was given IV to delineate the left ventricular endocardial borders. Pericardium: Trivial pericardial effusion is present. The pericardial effusion is circumferential. Venous: The inferior vena cava is normal in size with greater than 50% respiratory variability, suggesting right atrial pressure of 3 mmHg. Carlyle Dolly MD Electronically signed by Carlyle Dolly MD Signature Date/Time: 12/11/2019/3:12:49 PM    Final      CBC Recent Labs  Lab 12/09/19 1115 12/10/19 0347 12/11/19 0609  WBC 7.9 7.0 7.9  HGB 8.8*  8.0* 7.9*  HCT 27.6* 25.1* 24.5*  PLT 456* 419* 408*  MCV 98.9 98.8 97.2  MCH 31.5 31.5 31.3  MCHC 31.9 31.9 32.2  RDW 14.8 14.6 14.6  LYMPHSABS 1.6  --   --   MONOABS 0.6  --   --   EOSABS 0.1  --   --   BASOSABS 0.1  --   --     Chemistries  Recent Labs  Lab 12/09/19 1115 12/10/19 0613 12/11/19 0849  NA 133* 134* 134*  K 4.1 4.0 3.3*  CL 104 104 102  CO2 20* 20* 22  GLUCOSE 107* 109* 92  BUN 32* 45* 46*  CREATININE 2.05* 2.43* 2.55*  CALCIUM 9.6 9.3 9.3  AST 31  --   --   ALT 13  --   --   ALKPHOS 76  --   --   BILITOT 0.4  --   --    ------------------------------------------------------------------------------------------------------------------ No results for input(s): CHOL, HDL, LDLCALC, TRIG, CHOLHDL, LDLDIRECT in the last 72 hours.  No results found for: HGBA1C ------------------------------------------------------------------------------------------------------------------ No results for input(s): TSH, T4TOTAL, T3FREE, THYROIDAB in the last 72 hours.  Invalid input(s): FREET3 ------------------------------------------------------------------------------------------------------------------ No results for input(s): VITAMINB12, FOLATE, FERRITIN, TIBC, IRON, RETICCTPCT in the last 72 hours.  Coagulation profile Recent Labs  Lab 12/09/19 1749  INR NOT CALCULATED    No results for input(s): DDIMER in the last 72  hours.  Cardiac Enzymes No results for input(s): CKMB, TROPONINI, MYOGLOBIN in the last 168 hours.  Invalid input(s): CK ------------------------------------------------------------------------------------------------------------------    Component Value Date/Time   BNP 1,010.0 (H) 12/11/2019 2233     Roxan Hockey M.D on 12/11/2019 at 7:02 PM  Go to [email or web address] - for contact info  [facility] - Office  [phone]

## 2019-12-11 NOTE — Progress Notes (Signed)
[place] for heparin  Indication: chest pain/ACS  No Known Allergies  Patient Measurements: Height: 5\' 3"  (160 cm) Weight: 119 lb 9.6 oz (54.3 kg) IBW/kg (Calculated) : 56.9 Heparin Dosing Weight: 57 kg  Vital Signs: Temp: 97.8 F (36.6 C) (03/19 1333) Temp Source: Oral (03/19 1127) BP: 121/61 (03/19 1611) Pulse Rate: 70 (03/19 1611)  Labs: Recent Labs    12/09/19 1115 12/09/19 1115 12/09/19 1442 12/09/19 1749 12/10/19 0613 12/10/19 1502 12/10/19 2350 12/11/19 0609 12/11/19 0849 12/11/19 1803  HGB 8.8*   < >  --   --  8.0*  --   --  7.9*  --   --   HCT 27.6*  --   --   --  25.1*  --   --  24.5*  --   --   PLT 456*  --   --   --  419*  --   --  408*  --   --   APTT  --    < >  --  <20* 78*   < > 116*  --  97* 71*  LABPROT  --   --   --  <10.0*  --   --   --   --   --   --   INR  --   --   --  NOT CALCULATED  --   --   --   --   --   --   HEPARINUNFRC  --   --   --  >2.20* >2.20*  --   --   --   --   --   CREATININE 2.05*  --   --   --  2.43*  --   --   --  2.55*  --   TROPONINIHS 4,269*  --  3,532*  --  3,050*  --   --   --   --   --    < > = values in this interval not displayed.    Estimated Creatinine Clearance: 20.4 mL/min (A) (by C-G formula based on SCr of 2.55 mg/dL (H)).   Assessment: Pt with multiple medical hx including CVA who was admitted for CP. He has been on apixaban for his CVA/afib. It was last taken 3/17 at 0800. IV heparin has been ordered to r/o MI. Will start IV heparin tonight after 12 hr from first dose. His heparin level will be elevated due to apixaban so we will use APTT for monitoring until it's correlated.   APTT came back therapeutic tonight at 71. We will continue with the same rate.   Goal of Therapy:  Heparin level 0.3-0.7 units/ml  APTT 66-102 Monitor platelets by anticoagulation protocol: Yes   Plan:  Continue heparin infusion at 900 units/hr  Monitor daily heparin level/aPTT and s/s of  bleeding.  Onnie Boer, PharmD, BCIDP, AAHIVP, CPP Infectious Disease Pharmacist 12/11/2019 7:26 PM

## 2019-12-12 DIAGNOSIS — I214 Non-ST elevation (NSTEMI) myocardial infarction: Secondary | ICD-10-CM

## 2019-12-12 DIAGNOSIS — J9601 Acute respiratory failure with hypoxia: Principal | ICD-10-CM

## 2019-12-12 DIAGNOSIS — N179 Acute kidney failure, unspecified: Secondary | ICD-10-CM

## 2019-12-12 LAB — CBC
HCT: 24.7 % — ABNORMAL LOW (ref 39.0–52.0)
Hemoglobin: 7.8 g/dL — ABNORMAL LOW (ref 13.0–17.0)
MCH: 30.8 pg (ref 26.0–34.0)
MCHC: 31.6 g/dL (ref 30.0–36.0)
MCV: 97.6 fL (ref 80.0–100.0)
Platelets: 425 10*3/uL — ABNORMAL HIGH (ref 150–400)
RBC: 2.53 MIL/uL — ABNORMAL LOW (ref 4.22–5.81)
RDW: 14.4 % (ref 11.5–15.5)
WBC: 8.2 10*3/uL (ref 4.0–10.5)
nRBC: 0 % (ref 0.0–0.2)

## 2019-12-12 LAB — RENAL FUNCTION PANEL
Albumin: 2.6 g/dL — ABNORMAL LOW (ref 3.5–5.0)
Anion gap: 10 (ref 5–15)
BUN: 42 mg/dL — ABNORMAL HIGH (ref 8–23)
CO2: 21 mmol/L — ABNORMAL LOW (ref 22–32)
Calcium: 9.3 mg/dL (ref 8.9–10.3)
Chloride: 104 mmol/L (ref 98–111)
Creatinine, Ser: 2.44 mg/dL — ABNORMAL HIGH (ref 0.61–1.24)
GFR calc Af Amer: 30 mL/min — ABNORMAL LOW (ref >60)
GFR calc non Af Amer: 26 mL/min — ABNORMAL LOW (ref >60)
Glucose, Bld: 78 mg/dL (ref 70–99)
Phosphorus: 2.9 mg/dL (ref 2.5–4.6)
Potassium: 4.8 mmol/L (ref 3.5–5.1)
Sodium: 135 mmol/L (ref 135–145)

## 2019-12-12 LAB — BASIC METABOLIC PANEL
Anion gap: 9 (ref 5–15)
BUN: 43 mg/dL — ABNORMAL HIGH (ref 8–23)
CO2: 21 mmol/L — ABNORMAL LOW (ref 22–32)
Calcium: 9.2 mg/dL (ref 8.9–10.3)
Chloride: 104 mmol/L (ref 98–111)
Creatinine, Ser: 2.43 mg/dL — ABNORMAL HIGH (ref 0.61–1.24)
GFR calc Af Amer: 30 mL/min — ABNORMAL LOW (ref >60)
GFR calc non Af Amer: 26 mL/min — ABNORMAL LOW (ref >60)
Glucose, Bld: 77 mg/dL (ref 70–99)
Potassium: 4.7 mmol/L (ref 3.5–5.1)
Sodium: 134 mmol/L — ABNORMAL LOW (ref 135–145)

## 2019-12-12 LAB — GLUCOSE, CAPILLARY
Glucose-Capillary: 80 mg/dL (ref 70–99)
Glucose-Capillary: 102 mg/dL — ABNORMAL HIGH (ref 70–99)

## 2019-12-12 LAB — APTT: aPTT: 99 seconds — ABNORMAL HIGH (ref 24–36)

## 2019-12-12 LAB — HEPARIN LEVEL (UNFRACTIONATED): Heparin Unfractionated: 1.01 IU/mL — ABNORMAL HIGH (ref 0.30–0.70)

## 2019-12-12 NOTE — Progress Notes (Signed)
.                                                                                   Patient Demographics:    Anthony Andrews, is a 74 y.o. male, DOB - 05/11/1946, MCN:[id number]  Admit date - 12/09/2019   Admitting Physician Courage Denton Brick, MD  Outpatient Primary MD for the patient is [name], MD  LOS - 3   Chief Complaint  Patient presents with   Shortness of Breath        Subjective:    Anthony Andrews today has no fevers, no emesis,  No chest pain,   Orthopnea and dyspnea restless improved -Dyspnea on exertion persist  Assessment  & Plan :    Principal Problem:   Abnormal result of cardiovascular function study suggestive of non-ST elevation myocardial infarction (NSTEMI) Active Problems:   AKI (acute kidney injury) on CKD IIIa   Acute respiratory failure with hypoxia (HCC)   Atrial fibrillation (HCC)   Pneumonia   Acute on chronic HFrEF (heart failure with reduced ejection fraction) (HCC)   Non-ST elevation (NSTEMI) myocardial infarction ([facility])   Tobacco abuse   Acute on chronic combined systolic and diastolic CHF (congestive heart failure) (HCC)   PAF (paroxysmal atrial fibrillation) (HCC)   Essential hypertension   Bilateral carotid artery stenosis   Anemia   Stage 3 chronic kidney disease   Brief Summary 74 y.o. male with PMHx of Prior CVA with mild residual left-sided weakness, HTN, ongoing tobacco abuse, PAF on chronic anticoagulation with Eliquis, chronic anemia, recurrent falls syncope and status post 30-day event monitor placed on 09/16/2020 admitted on 12/09/2019 with concerns about possible NSTEMI and CHF exacerbation as well as possible superimposed right-sided pneumonia    A/p 1)Possible NSTEMI--- ???  Due to demand ischemia in the setting of CHF exacerbation --- on admission patient presented  with dyspnea, EKG with ST depression nonspecific ST and T wave changes -No chest pain, dyspnea improving, dyspnea on exertion persist - echo with EF down to 40%  from 45 to 50% less than 3 months ago on 09/17/2019, echo today also shows global hypokinesis and grade 2 diastolic dysfunction, pulmonary hypertension -Troponin 4,269 >>  4,848 >>> 3,050 -Stop Eliquis, continue IV heparin started on 12/09/2019---cardiology service do not restart Eliquis until decision on possible LHC has been made most likely on 12/14/2019 -Continue Coreg, Crestor and aspirin -Cardiology consult appreciated, patient may need RHC/LHC when renal function permits  2)HFrEF---  patient admitted with acute on chronic combined systolic and diastolic CHF exacerbation  --- Admission chest x-ray with possible pulmonary edema and pleural fluid -Repeat chest x-ray on 12/11/2019 continue to suggest CHF superimposed on emphysema and possible right-sided pneumonia -Repeat echo on 12/09/2018 as noted above #1 -Repeat echo on 12/11/2019 suggest EF of 40% without evidence of significant volume overload -BNP  3459 >> 1010 -Elevated troponins as above --Standing weight 119,  baseline weight 125  -Okay to stop IV Lasix, may consider oral Lasix after reevaluation in a.m. of 12/13/2019 ACE/ARB therapy was not prescribed due to Worsening renal function. -Monitor daily weights and fluid input and output accurately  3)AKI----acute kidney injury on CKD stage -  IIIa-worsening renal function might be due to CHF exacerbation with decreased renal perfusion   creatinine on admission= 2.0 , baseline creatinine = 1.4 (09/21/2019)    , creatinine 2.4 most likely due to diuresis, -- renally adjust medications, avoid nephrotoxic agents / dehydration  / hypotension --Continue sodium bicarb -Monitor renal function closely with diuresis for #2 above -Holding further diuretics for now since he appears to be euvolemic  4) chronic normocytic and normochromic anemia--- hemoglobin currently down to 7.9 from 8.8 on admission which is close to patient's baseline -No evidence of ongoing bleeding, PTA  the patient was on  Eliquis and now  switched to IV heparin -Stool occult blood requested  5) history of PAFib/history of second-degree AV block/history of tachyarrhythmia or bradyarrhythmia--- patient had a 30-day event monitor placed on 11/18/2019 -Preliminary review of his 30-day event monitor shows no significant arrhythmias -Continue Coreg, Eliquis stopped due to NSTEMI, IV heparin for now  6) history of COVID-19 infection--- resolved, asymptomatic, repeat Covid test negative at this time  7) history of prior stroke with residual left-sided hemiparesis----anticoagulation as above #1 and #5 -Continue statin and aspirin  8) possible Rt Sided pneumonia-----no fevers, no leukocytosis, patient continues to have shortness of breath, some cough, admission chest x-ray findings with pneumonia versus pulmonary edema---  -Repeat chest x-ray on 12/11/2019 continues to suggest CHF superimposed on emphysema with possible concomitant right-sided pneumonia --Continue Rocephin and doxycycline started on 12/09/2019 and as well as mucolytics and bronchodilators for possible pneumonia  9)tobacco abuse--- smoking cessation strongly advised, patient is not ready to quit  10) acute hypoxic respiratory failure--- secondary to #1,#2 and #8 above, continue supplemental oxygen, treat as above #1 and #2  11)Social/Ethics--- patient is a DNR/DNI, he is from [facility] where he has lived for over 6 months now  12)Generalized Weakness/deconditioning--PT eval appreciated recommends SNF rehab  Disposition/Need for in-Hospital Stay- patient unable to be discharged at this time due to --- acute CHF exacerbation requiring further  diuresis and IV heparin for possible NSTEMI -Anticipate discharge back to [facility] for discharge once acute medical issues resolved pending physical therapy eval -Not medically ready for discharge at this time -He is to be cleared by cardiology for discharge, may need cardiac cath prior to  discharge  Code Status : DNR  Family Communication:    (patient is alert, awake and coherent) --- Plan of care discussed with patient's son  Consults  :  cardiology  DVT Prophylaxis  : IV heparin  Lab Results  Component Value Date   PLT 425 (H) 12/12/2019    Inpatient Medications  Scheduled Meds:  acetaminophen  650 mg Oral TID   aspirin EC  81 mg Oral Q breakfast   carvedilol  3.125 mg Oral BID WC   cholecalciferol  1,000 Units Oral Daily   doxycycline  100 mg Oral Q12H   famotidine  20 mg Oral Daily   guaiFENesin  600 mg Oral BID   hydrALAZINE  25 mg Oral TID   ipratropium-albuterol  3 mL Nebulization Q6H   isosorbide mononitrate  15 mg Oral Daily   loratadine  10 mg Oral Daily   PARoxetine  20 mg Oral Daily   rosuvastatin  20 mg Oral QPM   sodium bicarbonate  650 mg Oral BID   sodium chloride flush  3 mL Intravenous Q12H   Continuous Infusions:  sodium chloride     cefTRIAXone (ROCEPHIN)  IV 1 g (12/12/19 1632)   heparin 900 Units/hr (12/12/19  0500)   PRN Meds:.sodium chloride, acetaminophen **OR** acetaminophen, albuterol, alum & mag hydroxide-simeth, ondansetron **OR** ondansetron (ZOFRAN) IV, polyethylene glycol, sodium chloride flush, traZODone    Anti-infectives (From admission, onward)   Start     Dose/Rate Route Frequency Ordered Stop   12/10/19 1400  cefTRIAXone (ROCEPHIN) 1 g in sodium chloride 0.9 % 100 mL IVPB     1 g 200 mL/hr over 30 Minutes Intravenous Every 24 hours 12/09/19 1741     12/09/19 2200  doxycycline (VIBRA-TABS) tablet 100 mg     100 mg Oral Every 12 hours 12/09/19 1741     12/09/19 1315  cefTRIAXone (ROCEPHIN) 1 g in sodium chloride 0.9 % 100 mL IVPB     1 g 200 mL/hr over 30 Minutes Intravenous  Once 12/09/19 1310 12/09/19 1445   12/09/19 1315  azithromycin (ZITHROMAX) 500 mg in sodium chloride 0.9 % 250 mL IVPB     500 mg 250 mL/hr over 60 Minutes Intravenous  Once 12/09/19 1310 12/09/19 1554         Objective:   Vitals:   12/12/19 0514 12/12/19 0719 12/12/19 1408 12/12/19 1443  BP: 130/85   (!) 121/59  Pulse: 79   61  Resp: 17   17  Temp: 98.2 F (36.8 C)   (!) 97.4 F (36.3 C)  TempSrc: Oral   Oral  SpO2: 100% 97% 98% 97%  Weight:      Height:        Wt Readings from Last 3 Encounters:  12/12/19 55.3 kg  11/17/19 57.1 kg  10/01/19 85.3 kg     Intake/Output Summary (Last 24 hours) at 12/12/2019 1834 Last data filed at 12/12/2019 1500 Gross per 24 hour  Intake 879 ml  Output 750 ml  Net 129 ml   Physical Exam  General exam: Alert, awake, oriented x 3 Respiratory system: Clear to auscultation. Respiratory effort normal. Cardiovascular system:RRR. No murmurs, rubs, gallops. Gastrointestinal system: Abdomen is nondistended, soft and nontender. No organomegaly or masses felt. Normal bowel sounds heard. Central nervous system: Alert and oriented. No focal neurological deficits. Extremities: No C/C/E, +pedal pulses Skin: No rashes, lesions or ulcers Psychiatry: Judgement and insight appear normal. Mood & affect appropriate.     Data Review:   Micro Results Recent Results (from the past 240 hour(s))  Respiratory Panel by RT PCR (Flu A&B, Covid) - Nasopharyngeal Swab     Status: None   Collection Time: 12/09/19  2:21 PM   Specimen: Nasopharyngeal Swab  Result Value Ref Range Status   SARS Coronavirus 2 by RT PCR NEGATIVE NEGATIVE Final    Comment: (NOTE) SARS-CoV-2 target nucleic acids are NOT DETECTED. The SARS-CoV-2 RNA is generally detectable in upper respiratoy specimens during the acute phase of infection. The lowest concentration of SARS-CoV-2 viral copies this assay can detect is 131 copies/mL. A negative result does not preclude SARS-Cov-2 infection and should not be used as the sole basis for treatment or other patient management decisions. A negative result may occur with  improper specimen collection/handling, submission of specimen other than  nasopharyngeal swab, presence of viral mutation(s) within the areas targeted by this assay, and inadequate number of viral copies (<131 copies/mL). A negative result must be combined with clinical observations, patient history, and epidemiological information. The expected result is Negative. Fact Sheet for Patients:  PinkCheek.be Fact Sheet for Healthcare Providers:  GravelBags.it This test is not yet ap proved or cleared by the [place] and  has been authorized  for detection and/or diagnosis of SARS-CoV-2 by FDA under an Emergency Use Authorization (EUA). This EUA will remain  in effect (meaning this test can be used) for the duration of the COVID-19 declaration under Section 564(b)(1) of the Act, 21 U.S.C. section 360bbb-3(b)(1), unless the authorization is terminated or revoked sooner.    Influenza A by PCR NEGATIVE NEGATIVE Final   Influenza B by PCR NEGATIVE NEGATIVE Final    Comment: (NOTE) The Xpert Xpress SARS-CoV-2/FLU/RSV assay is intended as an aid in  the diagnosis of influenza from Nasopharyngeal swab specimens and  should not be used as a sole basis for treatment. Nasal washings and  aspirates are unacceptable for Xpert Xpress SARS-CoV-2/FLU/RSV  testing. Fact Sheet for Patients: PinkCheek.be Fact Sheet for Healthcare Providers: GravelBags.it This test is not yet approved or cleared by the [place] FDA and  has been authorized for detection and/or diagnosis of SARS-CoV-2 by  FDA under an Emergency Use Authorization (EUA). This EUA will remain  in effect (meaning this test can be used) for the duration of the  Covid-19 declaration under Section 564(b)(1) of the Act, 21  U.S.C. section 360bbb-3(b)(1), unless the authorization is  terminated or revoked. Performed at [facility], [address] [place], [place] [address]     Radiology  Reports DG Chest 2 View  Result Date: 12/11/2019 CLINICAL DATA:  Shortness of breath with atrial fibrillation and hypertension EXAM: CHEST - 2 VIEW COMPARISON:  December 09, 2019 chest radiograph and chest CT September 15, 2020 FINDINGS: Underlying emphysematous change is noted, most severe in the right upper lobe. There is a right pleural effusion with airspace opacity in the right lower lobe. There is atelectatic change in the left base. Heart is mildly enlarged. There is mild interstitial edema. Heart is slightly enlarged with pulmonary vascularity reflecting the underlying emphysema. There is aortic atherosclerosis. There is a loop recorder anteriorly. There is postoperative change in the left medial shoulder region. IMPRESSION: Findings most consistent with congestive heart failure superimposed on emphysematous change. Cardiomegaly is present with interstitial edema. Right pleural effusion. There is suspected superimposed right lower lobe pneumonia. There is aortic atherosclerosis. Aortic Atherosclerosis (ICD10-I70.0) and Emphysema (ICD10-J43.9). Electronically Signed   By: Lowella Grip III M.D.   On: 12/11/2019 11:14   DG Chest Portable 1 View  Result Date: 12/09/2019 CLINICAL DATA:  EMS reports pt is a residen at [facility]. Reports sob and cp since last night. Per Pts chart Pt has had COVID - unsure when. Hx A-fib, stroke, smoker EXAM: PORTABLE CHEST 1 VIEW COMPARISON:  09/15/2019 FINDINGS: Lungs density interstitial hazy airspace lung opacities, with more confluent opacity in the right lung base, obscuring the hemidiaphragm. Cardiac silhouette is normal in size.  Mediastinal hilar masses. Probable pleural effusions, larger on the right.  No pneumothorax. IMPRESSION: 1. Interstitial and hazy airspace lung opacities bilaterally with more confluent opacity noted in the right lung base. Findings may reflect multifocal pneumonia or pulmonary edema. Suspect right lung base opacity due to a combination of  pleural fluid with either atelectasis or more confluent infection. Electronically Signed   By: Lajean Manes M.D.   On: 12/09/2019 11:34   ECHOCARDIOGRAM COMPLETE  Result Date: 12/09/2019    ECHOCARDIOGRAM REPORT   Patient Name:   ADARIAN BUR Date of Exam: 12/09/2019 Medical Rec #:  [id number]   Height:       63.0 in Accession #:    [id number]  Weight:       125.0 lb Date  of Birth:  Feb 09, 1946  BSA:          1.584 m Patient Age:    40 years    BP:           140/78 mmHg Patient Gender: M           HR:           90 bpm. Exam Location:  [facility] Procedure: 2D Echo Indications:    Dyspnea 786.09 / R06.00  History:        Patient has prior history of Echocardiogram examinations, most                 recent 09/17/2019. Stroke, Arrythmias:Atrial Fibrillation; Risk                 Factors:Current Smoker. Rhabdomyolysis, Pneumonia.  Sonographer:    Leavy Cella RDCS (AE) Referring Phys: [id number] COURAGE EMOKPAE IMPRESSIONS  1. Left ventricular ejection fraction, by estimation, is 40%. The left ventricle has moderately decreased function. The left ventricle demonstrates global hypokinesis. Left ventricular diastolic parameters are consistent with Grade II diastolic dysfunction (pseudonormalization).  2. Right ventricular systolic function is normal. The right ventricular size is normal. There is moderately elevated pulmonary artery systolic pressure.  3. The mitral valve is grossly normal. Mild mitral valve regurgitation.  4. Morphologically, there appears to be mild aortic stenosis. The aortic valve is tricuspid. Aortic valve regurgitation is not visualized.  5. The inferior vena cava is normal in size with greater than 50% respiratory variability, suggesting right atrial pressure of 3 mmHg. FINDINGS  Left Ventricle: Left ventricular ejection fraction, by estimation, is 40%. The left ventricle has moderately decreased function. The left ventricle demonstrates global hypokinesis. The left ventricular internal cavity  size was normal in size. There is no left ventricular hypertrophy. Left ventricular diastolic parameters are consistent with Grade II diastolic dysfunction (pseudonormalization). Indeterminate filling pressures. Right Ventricle: The right ventricular size is normal. No increase in right ventricular wall thickness. Right ventricular systolic function is normal. There is moderately elevated pulmonary artery systolic pressure. The tricuspid regurgitant velocity is 3.08 m/s, and with an assumed right atrial pressure of 10 mmHg, the estimated right ventricular systolic pressure is 72.6 mmHg. Left Atrium: Left atrial size was normal in size. Right Atrium: Right atrial size was normal in size. Pericardium: There is no evidence of pericardial effusion. Mitral Valve: The mitral valve is grossly normal. Mild mitral valve regurgitation. Tricuspid Valve: The tricuspid valve is grossly normal. Tricuspid valve regurgitation is mild. Aortic Valve: Morphologically, there appears to be mild aortic stenosis. The aortic valve is tricuspid. . There is mild thickening and mild calcification of the aortic valve. Aortic valve regurgitation is not visualized. Mild aortic valve annular calcification. There is mild thickening of the aortic valve. There is mild calcification of the aortic valve. Pulmonic Valve: The pulmonic valve was grossly normal. Pulmonic valve regurgitation is not visualized. Aorta: The aortic root is normal in size and structure. Venous: The inferior vena cava is normal in size with greater than 50% respiratory variability, suggesting right atrial pressure of 3 mmHg. IAS/Shunts: No atrial level shunt detected by color flow Doppler.  LEFT VENTRICLE PLAX 2D LVIDd:         4.76 cm  Diastology LVIDs:         4.10 cm  LV e' lateral:   6.96 cm/s LV PW:         0.84 cm  LV E/e' lateral: 9.4 LV IVS:  0.79 cm  LV e' medial:    5.98 cm/s LVOT diam:     2.10 cm  LV E/e' medial:  11.0 LVOT Area:     3.46 cm  RIGHT VENTRICLE  RV S prime:     11.60 cm/s TAPSE (M-mode): 1.7 cm LEFT ATRIUM             Index       RIGHT ATRIUM           Index LA diam:        4.00 cm 2.53 cm/m  RA Area:     13.90 cm LA Vol (A2C):   74.9 ml 47.30 ml/m RA Volume:   37.30 ml  23.55 ml/m LA Vol (A4C):   42.6 ml 26.90 ml/m LA Biplane Vol: 56.1 ml 35.43 ml/m   AORTA Ao Root diam: 2.80 cm MITRAL VALVE               TRICUSPID VALVE MV Area (PHT): 2.91 cm    TR Peak grad:   37.9 mmHg MV Decel Time: 261 msec    TR Vmax:        308.00 cm/s MR Peak grad: 42.2 mmHg MR Mean grad: 29.0 mmHg    SHUNTS MR Vmax:      325.00 cm/s  Systemic Diam: 2.10 cm MR Vmean:     254.0 cm/s MV E velocity: 65.50 cm/s MV A velocity: 41.70 cm/s MV E/A ratio:  1.57 Kate Sable MD Electronically signed by Kate Sable MD Signature Date/Time: 12/09/2019/3:20:59 PM    Final    ECHOCARDIOGRAM LIMITED  Result Date: 12/11/2019    ECHOCARDIOGRAM LIMITED REPORT   Patient Name:   ERIQ HUFFORD Date of Exam: 12/11/2019 Medical Rec #:  [id number]   Height:       63.0 in Accession #:    [id number]  Weight:       119.6 lb Date of Birth:  10/29/1945  BSA:          1.554 m Patient Age:    32 years    BP:           114/81 mmHg Patient Gender: M           HR:           73 bpm. Exam Location:  [facility] Procedure: Limited Echo Indications:    Dyspnea 786.09 / R06.00  History:        Patient has prior history of Echocardiogram examinations, most                 recent 12/09/2019. Previous Myocardial Infarction,                 Arrythmias:Atrial Fibrillation; Risk Factors:Current Smoker and                 Hypertension. Rhabdomyolysis.  Sonographer:    Leavy Cella RDCS (AE) Referring Phys: [id number] [place]  1. Left ventricular ejection fraction, by estimation, is 40%. The left ventricle demonstrates global hypokinesis.  2. The inferior vena cava is normal in size with greater than 50% respiratory variability, suggesting right atrial pressure of 3 mmHg.  3. Limited echo to  evaluate LV function. Echocontrast was used to enhance visualization. FINDINGS  Left Ventricle: Left ventricular ejection fraction, by estimation, is 40%. The left ventricle demonstrates global hypokinesis. Definity contrast agent was given IV to delineate the left ventricular endocardial borders. Pericardium: Trivial pericardial effusion is present. The pericardial effusion is circumferential.  Venous: The inferior vena cava is normal in size with greater than 50% respiratory variability, suggesting right atrial pressure of 3 mmHg. Carlyle Dolly MD Electronically signed by Carlyle Dolly MD Signature Date/Time: 12/11/2019/3:12:49 PM    Final      CBC Recent Labs  Lab 12/09/19 1115 12/10/19 4827 12/11/19 0609 12/12/19 0404  WBC 7.9 7.0 7.9 8.2  HGB 8.8* 8.0* 7.9* 7.8*  HCT 27.6* 25.1* 24.5* 24.7*  PLT 456* 419* 408* 425*  MCV 98.9 98.8 97.2 97.6  MCH 31.5 31.5 31.3 30.8  MCHC 31.9 31.9 32.2 31.6  RDW 14.8 14.6 14.6 14.4  LYMPHSABS 1.6  --   --   --   MONOABS 0.6  --   --   --   EOSABS 0.1  --   --   --   BASOSABS 0.1  --   --   --     Chemistries  Recent Labs  Lab 12/09/19 1115 12/10/19 0613 12/11/19 0849 12/12/19 0404  NA 133* 134* 134* 135   134*  K 4.1 4.0 3.3* 4.8   4.7  CL 104 104 102 104   104  CO2 20* 20* 22 21*   21*  GLUCOSE 107* 109* 92 78   77  BUN 32* 45* 46* 42*   43*  CREATININE 2.05* 2.43* 2.55* 2.44*   2.43*  CALCIUM 9.6 9.3 9.3 9.3   9.2  AST 31  --   --   --   ALT 13  --   --   --   ALKPHOS 76  --   --   --   BILITOT 0.4  --   --   --    ------------------------------------------------------------------------------------------------------------------ No results for input(s): CHOL, HDL, LDLCALC, TRIG, CHOLHDL, LDLDIRECT in the last 72 hours.  No results found for: HGBA1C ------------------------------------------------------------------------------------------------------------------ No results for input(s): TSH, T4TOTAL, T3FREE, THYROIDAB in the  last 72 hours.  Invalid input(s): FREET3 ------------------------------------------------------------------------------------------------------------------ No results for input(s): VITAMINB12, FOLATE, FERRITIN, TIBC, IRON, RETICCTPCT in the last 72 hours.  Coagulation profile Recent Labs  Lab 12/09/19 1749  INR NOT CALCULATED    No results for input(s): DDIMER in the last 72 hours.  Cardiac Enzymes No results for input(s): CKMB, TROPONINI, MYOGLOBIN in the last 168 hours.  Invalid input(s): CK ------------------------------------------------------------------------------------------------------------------    Component Value Date/Time   BNP 1,010.0 (H) 12/11/2019 0786     Kathie Dike M.D on 12/12/2019 at 6:34 PM  Go to [email or web address] - for contact info  [facility] - Office  [phone]

## 2019-12-12 NOTE — Progress Notes (Signed)
[place] for heparin  Indication: chest pain/ACS  No Known Allergies  Patient Measurements: Height: 5\' 3"  (160 cm) Weight: 122 lb (55.3 kg) IBW/kg (Calculated) : 56.9 Heparin Dosing Weight: 57 kg  Vital Signs: Temp: 98.2 F (36.8 C) (03/20 0514) Temp Source: Oral (03/20 0514) BP: 130/85 (03/20 0514) Pulse Rate: 79 (03/20 0514)  Labs: Recent Labs    12/09/19 1115 12/09/19 1115 12/09/19 1442 12/09/19 1749 12/10/19 7322 12/10/19 1502 12/10/19 2350 12/11/19 0609 12/11/19 0849 12/11/19 1803 12/12/19 0404  HGB 8.8*   < >  --   --  8.0*  --   --  7.9*  --   --  7.8*  HCT 27.6*   < >  --   --  25.1*  --   --  24.5*  --   --  24.7*  PLT 456*   < >  --   --  419*  --   --  408*  --   --  425*  APTT  --    < >  --  <20* 78*   < >   < >  --  97* 71* 99*  LABPROT  --   --   --  <10.0*  --   --   --   --   --   --   --   INR  --   --   --  NOT CALCULATED  --   --   --   --   --   --   --   HEPARINUNFRC  --   --   --  >2.20* >2.20*  --   --   --   --   --  1.01*  CREATININE 2.05*   < >  --   --  2.43*  --   --   --  2.55*  --  2.44*  2.43*  TROPONINIHS 4,269*  --  0,254*  --  3,050*  --   --   --   --   --   --    < > = values in this interval not displayed.    Estimated Creatinine Clearance: 21.8 mL/min (A) (by C-G formula based on SCr of 2.43 mg/dL (H)).   Assessment: Pt with multiple medical hx including CVA who was admitted for CP. He has been on apixaban for his CVA/afib. It was last taken 3/17 at 0800. IV heparin has been ordered to r/o MI. Will start IV heparin tonight after 12 hr from first dose. His heparin level will be elevated due to apixaban so we will use APTT for monitoring until it's correlated.   APTT came back therapeutic this morning 99. We will continue with the same rate.   Goal of Therapy:  Heparin level 0.3-0.7 units/ml  APTT 66-102 Monitor platelets by anticoagulation protocol: Yes   Plan:  Continue heparin  infusion at 900 units/hr  Monitor daily heparin level/aPTT and s/s of bleeding.  Thomasenia Sales, PharmD, MBA, BCGP Clinical Pharmacist  12/12/2019 7:37 AM

## 2019-12-12 NOTE — Plan of Care (Signed)

## 2019-12-13 LAB — BASIC METABOLIC PANEL
Anion gap: 6 (ref 5–15)
BUN: 36 mg/dL — ABNORMAL HIGH (ref 8–23)
CO2: 22 mmol/L (ref 22–32)
Calcium: 9 mg/dL (ref 8.9–10.3)
Chloride: 104 mmol/L (ref 98–111)
Creatinine, Ser: 2.26 mg/dL — ABNORMAL HIGH (ref 0.61–1.24)
GFR calc Af Amer: 33 mL/min — ABNORMAL LOW (ref >60)
GFR calc non Af Amer: 28 mL/min — ABNORMAL LOW (ref >60)
Glucose, Bld: 85 mg/dL (ref 70–99)
Potassium: 4.4 mmol/L (ref 3.5–5.1)
Sodium: 132 mmol/L — ABNORMAL LOW (ref 135–145)

## 2019-12-13 LAB — CBC
HCT: 26.7 % — ABNORMAL LOW (ref 39.0–52.0)
Hemoglobin: 8.4 g/dL — ABNORMAL LOW (ref 13.0–17.0)
MCH: 30.7 pg (ref 26.0–34.0)
MCHC: 31.5 g/dL (ref 30.0–36.0)
MCV: 97.4 fL (ref 80.0–100.0)
Platelets: 416 10*3/uL — ABNORMAL HIGH (ref 150–400)
RBC: 2.74 MIL/uL — ABNORMAL LOW (ref 4.22–5.81)
RDW: 14.6 % (ref 11.5–15.5)
WBC: 7.5 10*3/uL (ref 4.0–10.5)
nRBC: 0 % (ref 0.0–0.2)

## 2019-12-13 LAB — HEPARIN LEVEL (UNFRACTIONATED): Heparin Unfractionated: 0.92 IU/mL — ABNORMAL HIGH (ref 0.30–0.70)

## 2019-12-13 LAB — APTT: aPTT: 93 seconds — ABNORMAL HIGH (ref 24–36)

## 2019-12-13 MED ORDER — IPRATROPIUM-ALBUTEROL 0.5-2.5 (3) MG/3ML IN SOLN
3.0000 mL | RESPIRATORY_TRACT | Status: DC | PRN
  Filled 2019-12-13: qty 3

## 2019-12-13 NOTE — Progress Notes (Signed)
.                                                                                   Patient Demographics:    Oscar Hank, is a 74 y.o. male, DOB - [birth year], YOV:[id number]  Admit date - 12/09/2019   Admitting Physician Courage Denton Brick, MD  Outpatient Primary MD for the patient is [name], MD  LOS - 4   Chief Complaint  Patient presents with  . Shortness of Breath        Subjective:    Lynn Sissel feels her breathing is improving.  Does not describe much cough.  No chest pain.  Assessment  & Plan :    Principal Problem:   Abnormal result of cardiovascular function study suggestive of non-ST elevation myocardial infarction (NSTEMI) Active Problems:   AKI (acute kidney injury) on CKD IIIa   Acute respiratory failure with hypoxia (HCC)   Atrial fibrillation (HCC)   Pneumonia   Acute on chronic HFrEF (heart failure with reduced ejection fraction) (HCC)   Non-ST elevation (NSTEMI) myocardial infarction (HCC)   Tobacco abuse   Acute on chronic combined systolic and diastolic CHF (congestive heart failure) (HCC)   PAF (paroxysmal atrial fibrillation) (HCC)   Essential hypertension   Bilateral carotid artery stenosis   Anemia   Stage 3 chronic kidney disease   Brief Summary 73 y.o. male with PMHx of Prior CVA with mild residual left-sided weakness, HTN, ongoing tobacco abuse, PAF on chronic anticoagulation with Eliquis, chronic anemia, recurrent falls syncope and status post 30-day event monitor placed on 09/16/2020 admitted on 12/09/2019 with concerns about possible NSTEMI and CHF exacerbation as well as possible superimposed right-sided pneumonia    A/p 1)Possible NSTEMI--- ???  Due to demand ischemia in the setting of CHF exacerbation --- on admission patient presented  with dyspnea, EKG with ST depression nonspecific ST and T wave changes -No chest pain, dyspnea improving, dyspnea on exertion persist - echo with EF down to 40% from 45 to 50% less than 3 months ago on  09/17/2019, echo today also shows global hypokinesis and grade 2 diastolic dysfunction, pulmonary hypertension -Troponin 4,269 >>  4,848 >>> 3,050 -Stop Eliquis, continue IV heparin started on 12/09/2019---cardiology service do not restart Eliquis until decision on possible LHC has been made most likely on 12/14/2019 -Continue Coreg, Crestor and aspirin -Cardiology consult appreciated, patient may need RHC/LHC when renal function permits  2)HFrEF---  patient admitted with acute on chronic combined systolic and diastolic CHF exacerbation  --- Admission chest x-ray with possible pulmonary edema and pleural fluid -Repeat chest x-ray on 12/11/2019 continue to suggest CHF superimposed on emphysema and possible right-sided pneumonia -Repeat echo on 12/09/2018 as noted above #1 -Repeat echo on 12/11/2019 suggest EF of 40% without evidence of significant volume overload -BNP  3459 >> 1010 -Elevated troponins as above --Standing weight 119,  baseline weight 125  -Okay to stop IV Lasix, may consider oral Lasix after reevaluation in a.m. of 12/14/2019 ACE/ARB therapy was not prescribed due to Worsening renal function. -Monitor daily weights and fluid input and output accurately  3)AKI----acute kidney injury on CKD stage - IIIa-worsening renal function might be due  to CHF exacerbation with decreased renal perfusion   creatinine on admission= 2.0 , baseline creatinine = 1.4 (09/21/2019)    , creatinine 2.2 most likely due to diuresis, -- renally adjust medications, avoid nephrotoxic agents / dehydration  / hypotension --Continue sodium bicarb -Monitor renal function closely with diuresis for #2 above -Holding further diuretics for now since he appears to be euvolemic  4) chronic normocytic and normochromic anemia---  hemoglobin currently stable at 8.4 -No evidence of ongoing bleeding, PTA  the patient was on Eliquis and now  switched to IV heparin -Stool occult blood requested  5) history of  PAFib/history of second-degree AV block/history of tachyarrhythmia or bradyarrhythmia--- patient had a 30-day event monitor placed on 11/18/2019 -Preliminary review of his 30-day event monitor shows no significant arrhythmias -Continue Coreg, Eliquis stopped due to NSTEMI, IV heparin for now  6) history of COVID-19 infection--- resolved, asymptomatic, repeat Covid test negative at this time  7) history of prior stroke with residual left-sided hemiparesis----anticoagulation as above #1 and #5 -Continue statin and aspirin  8) possible Rt Sided pneumonia-----no fevers, no leukocytosis, patient continues to have shortness of breath, some cough, admission chest x-ray findings with pneumonia versus pulmonary edema---  -Repeat chest x-ray on 12/11/2019 continues to suggest CHF superimposed on emphysema with possible concomitant right-sided pneumonia --Continue Rocephin and doxycycline started on 12/09/2019 and as well as mucolytics and bronchodilators for possible pneumonia  9)tobacco abuse--- smoking cessation strongly advised, patient is not ready to quit  10) acute hypoxic respiratory failure--- secondary to #1,#2 and #8 above, continue supplemental oxygen, treat as above #1 and #2  11)Social/Ethics--- patient is a DNR/DNI, he is from [facility] where he has lived for over 6 months now  12)Generalized Weakness/deconditioning--PT eval appreciated recommends SNF rehab  Disposition/Need for in-Hospital Stay- patient unable to be discharged at this time due to --- acute CHF exacerbation requiring further  diuresis and IV heparin for possible NSTEMI -Anticipate discharge back to [facility] for discharge once acute medical issues resolved -Not medically ready for discharge at this time -He is to be cleared by cardiology for discharge, may need cardiac cath prior to discharge  Code Status : DNR  Family Communication:    (patient is alert, awake and coherent) --- Plan of care discussed with  patient's son  Consults  :  cardiology  DVT Prophylaxis  : IV heparin  Lab Results  Component Value Date   PLT 416 (H) 12/13/2019    Inpatient Medications  Scheduled Meds: . acetaminophen  650 mg Oral TID  . aspirin EC  81 mg Oral Q breakfast  . carvedilol  3.125 mg Oral BID WC  . cholecalciferol  1,000 Units Oral Daily  . doxycycline  100 mg Oral Q12H  . famotidine  20 mg Oral Daily  . guaiFENesin  600 mg Oral BID  . hydrALAZINE  25 mg Oral TID  . isosorbide mononitrate  15 mg Oral Daily  . loratadine  10 mg Oral Daily  . PARoxetine  20 mg Oral Daily  . rosuvastatin  20 mg Oral QPM  . sodium bicarbonate  650 mg Oral BID  . sodium chloride flush  3 mL Intravenous Q12H   Continuous Infusions: . sodium chloride    . cefTRIAXone (ROCEPHIN)  IV 1 g (12/12/19 1632)  . heparin 900 Units/hr (12/13/19 1123)   PRN Meds:.sodium chloride, acetaminophen **OR** acetaminophen, alum & mag hydroxide-simeth, ipratropium-albuterol, ondansetron **OR** ondansetron (ZOFRAN) IV, polyethylene glycol, sodium chloride flush, traZODone  Anti-infectives (From admission, onward)   Start     Dose/Rate Route Frequency Ordered Stop   12/10/19 1400  cefTRIAXone (ROCEPHIN) 1 g in sodium chloride 0.9 % 100 mL IVPB     1 g 200 mL/hr over 30 Minutes Intravenous Every 24 hours 12/09/19 1741     12/09/19 2200  doxycycline (VIBRA-TABS) tablet 100 mg     100 mg Oral Every 12 hours 12/09/19 1741     12/09/19 1315  cefTRIAXone (ROCEPHIN) 1 g in sodium chloride 0.9 % 100 mL IVPB     1 g 200 mL/hr over 30 Minutes Intravenous  Once 12/09/19 1310 12/09/19 1445   12/09/19 1315  azithromycin (ZITHROMAX) 500 mg in sodium chloride 0.9 % 250 mL IVPB     500 mg 250 mL/hr over 60 Minutes Intravenous  Once 12/09/19 1310 12/09/19 1554        Objective:   Vitals:   12/12/19 2102 12/13/19 0205 12/13/19 0516 12/13/19 0722  BP: 129/74  (!) 145/88   Pulse: 76  78   Resp: 17  18   Temp: 98.1 F (36.7 C)  98 F  (36.7 C)   TempSrc: Oral  Oral   SpO2: 99% 97% 97% 97%  Weight:   54.3 kg   Height:        Wt Readings from Last 3 Encounters:  12/13/19 54.3 kg  11/17/19 57.1 kg  10/01/19 85.3 kg     Intake/Output Summary (Last 24 hours) at 12/13/2019 1418 Last data filed at 12/13/2019 0707 Gross per 24 hour  Intake 180 ml  Output 1150 ml  Net -970 ml   Physical Exam  General exam: Alert, awake, oriented x 3 Respiratory system: Clear to auscultation. Respiratory effort normal. Cardiovascular system:RRR. No murmurs, rubs, gallops. Gastrointestinal system: Abdomen is nondistended, soft and nontender. No organomegaly or masses felt. Normal bowel sounds heard. Central nervous system: Alert and oriented. No focal neurological deficits. Extremities: No C/C/E, +pedal pulses Skin: No rashes, lesions or ulcers Psychiatry: Judgement and insight appear normal. Mood & affect appropriate.  .     Data Review:   Micro Results Recent Results (from the past 240 hour(s))  Respiratory Panel by RT PCR (Flu A&B, Covid) - Nasopharyngeal Swab     Status: None   Collection Time: 12/09/19  2:21 PM   Specimen: Nasopharyngeal Swab  Result Value Ref Range Status   SARS Coronavirus 2 by RT PCR NEGATIVE NEGATIVE Final    Comment: (NOTE) SARS-CoV-2 target nucleic acids are NOT DETECTED. The SARS-CoV-2 RNA is generally detectable in upper respiratoy specimens during the acute phase of infection. The lowest concentration of SARS-CoV-2 viral copies this assay can detect is 131 copies/mL. A negative result does not preclude SARS-Cov-2 infection and should not be used as the sole basis for treatment or other patient management decisions. A negative result may occur with  improper specimen collection/handling, submission of specimen other than nasopharyngeal swab, presence of viral mutation(s) within the areas targeted by this assay, and inadequate number of viral copies (<131 copies/mL). A negative result must be  combined with clinical observations, patient history, and epidemiological information. The expected result is Negative. Fact Sheet for Patients:  PinkCheek.be Fact Sheet for Healthcare Providers:  GravelBags.it This test is not yet ap proved or cleared by the [place] FDA and  has been authorized for detection and/or diagnosis of SARS-CoV-2 by FDA under an Emergency Use Authorization (EUA). This EUA will remain  in effect (meaning this test can be  used) for the duration of the COVID-19 declaration under Section 564(b)(1) of the Act, 21 U.S.C. section 360bbb-3(b)(1), unless the authorization is terminated or revoked sooner.    Influenza A by PCR NEGATIVE NEGATIVE Final   Influenza B by PCR NEGATIVE NEGATIVE Final    Comment: (NOTE) The Xpert Xpress SARS-CoV-2/FLU/RSV assay is intended as an aid in  the diagnosis of influenza from Nasopharyngeal swab specimens and  should not be used as a sole basis for treatment. Nasal washings and  aspirates are unacceptable for Xpert Xpress SARS-CoV-2/FLU/RSV  testing. Fact Sheet for Patients: PinkCheek.be Fact Sheet for Healthcare Providers: GravelBags.it This test is not yet approved or cleared by the [place] FDA and  has been authorized for detection and/or diagnosis of SARS-CoV-2 by  FDA under an Emergency Use Authorization (EUA). This EUA will remain  in effect (meaning this test can be used) for the duration of the  Covid-19 declaration under Section 564(b)(1) of the Act, 21  U.S.C. section 360bbb-3(b)(1), unless the authorization is  terminated or revoked. Performed at [facility], [address], [place], [place] [address]     Radiology Reports DG Chest 2 View  Result Date: 12/11/2019 CLINICAL DATA:  Shortness of breath with atrial fibrillation and hypertension EXAM: CHEST - 2 VIEW COMPARISON:  December 09, 2019  chest radiograph and chest CT September 15, 2020 FINDINGS: Underlying emphysematous change is noted, most severe in the right upper lobe. There is a right pleural effusion with airspace opacity in the right lower lobe. There is atelectatic change in the left base. Heart is mildly enlarged. There is mild interstitial edema. Heart is slightly enlarged with pulmonary vascularity reflecting the underlying emphysema. There is aortic atherosclerosis. There is a loop recorder anteriorly. There is postoperative change in the left medial shoulder region. IMPRESSION: Findings most consistent with congestive heart failure superimposed on emphysematous change. Cardiomegaly is present with interstitial edema. Right pleural effusion. There is suspected superimposed right lower lobe pneumonia. There is aortic atherosclerosis. Aortic Atherosclerosis (ICD10-I70.0) and Emphysema (ICD10-J43.9). Electronically Signed   By: Lowella Grip III M.D.   On: 12/11/2019 11:14   DG Chest Portable 1 View  Result Date: 12/09/2019 CLINICAL DATA:  EMS reports pt is a residen at [facility]. Reports sob and cp since last night. Per Pts chart Pt has had COVID - unsure when. Hx A-fib, stroke, smoker EXAM: PORTABLE CHEST 1 VIEW COMPARISON:  09/15/2019 FINDINGS: Lungs density interstitial hazy airspace lung opacities, with more confluent opacity in the right lung base, obscuring the hemidiaphragm. Cardiac silhouette is normal in size.  Mediastinal hilar masses. Probable pleural effusions, larger on the right.  No pneumothorax. IMPRESSION: 1. Interstitial and hazy airspace lung opacities bilaterally with more confluent opacity noted in the right lung base. Findings may reflect multifocal pneumonia or pulmonary edema. Suspect right lung base opacity due to a combination of pleural fluid with either atelectasis or more confluent infection. Electronically Signed   By: Lajean Manes M.D.   On: 12/09/2019 11:34   ECHOCARDIOGRAM COMPLETE  Result  Date: 12/09/2019    ECHOCARDIOGRAM REPORT   Patient Name:   MACE WEINBERG Date of Exam: 12/09/2019 Medical Rec #:  [id number]   Height:       63.0 in Accession #:    [id number]  Weight:       125.0 lb Date of Birth:  02-10-1946  BSA:          1.584 m Patient Age:    50 years  BP:           140/78 mmHg Patient Gender: M           HR:           90 bpm. Exam Location:  [facility] Procedure: 2D Echo Indications:    Dyspnea 786.09 / R06.00  History:        Patient has prior history of Echocardiogram examinations, most                 recent 09/17/2019. Stroke, Arrythmias:Atrial Fibrillation; Risk                 Factors:Current Smoker. Rhabdomyolysis, Pneumonia.  Sonographer:    Leavy Cella RDCS (AE) Referring Phys: [id number] COURAGE EMOKPAE IMPRESSIONS  1. Left ventricular ejection fraction, by estimation, is 40%. The left ventricle has moderately decreased function. The left ventricle demonstrates global hypokinesis. Left ventricular diastolic parameters are consistent with Grade II diastolic dysfunction (pseudonormalization).  2. Right ventricular systolic function is normal. The right ventricular size is normal. There is moderately elevated pulmonary artery systolic pressure.  3. The mitral valve is grossly normal. Mild mitral valve regurgitation.  4. Morphologically, there appears to be mild aortic stenosis. The aortic valve is tricuspid. Aortic valve regurgitation is not visualized.  5. The inferior vena cava is normal in size with greater than 50% respiratory variability, suggesting right atrial pressure of 3 mmHg. FINDINGS  Left Ventricle: Left ventricular ejection fraction, by estimation, is 40%. The left ventricle has moderately decreased function. The left ventricle demonstrates global hypokinesis. The left ventricular internal cavity size was normal in size. There is no left ventricular hypertrophy. Left ventricular diastolic parameters are consistent with Grade II diastolic dysfunction (pseudonormalization).  Indeterminate filling pressures. Right Ventricle: The right ventricular size is normal. No increase in right ventricular wall thickness. Right ventricular systolic function is normal. There is moderately elevated pulmonary artery systolic pressure. The tricuspid regurgitant velocity is 3.08 m/s, and with an assumed right atrial pressure of 10 mmHg, the estimated right ventricular systolic pressure is 29.5 mmHg. Left Atrium: Left atrial size was normal in size. Right Atrium: Right atrial size was normal in size. Pericardium: There is no evidence of pericardial effusion. Mitral Valve: The mitral valve is grossly normal. Mild mitral valve regurgitation. Tricuspid Valve: The tricuspid valve is grossly normal. Tricuspid valve regurgitation is mild. Aortic Valve: Morphologically, there appears to be mild aortic stenosis. The aortic valve is tricuspid. . There is mild thickening and mild calcification of the aortic valve. Aortic valve regurgitation is not visualized. Mild aortic valve annular calcification. There is mild thickening of the aortic valve. There is mild calcification of the aortic valve. Pulmonic Valve: The pulmonic valve was grossly normal. Pulmonic valve regurgitation is not visualized. Aorta: The aortic root is normal in size and structure. Venous: The inferior vena cava is normal in size with greater than 50% respiratory variability, suggesting right atrial pressure of 3 mmHg. IAS/Shunts: No atrial level shunt detected by color flow Doppler.  LEFT VENTRICLE PLAX 2D LVIDd:         4.76 cm  Diastology LVIDs:         4.10 cm  LV e' lateral:   6.96 cm/s LV PW:         0.84 cm  LV E/e' lateral: 9.4 LV IVS:        0.79 cm  LV e' medial:    5.98 cm/s LVOT diam:     2.10 cm  LV E/e'  medial:  11.0 LVOT Area:     3.46 cm  RIGHT VENTRICLE RV S prime:     11.60 cm/s TAPSE (M-mode): 1.7 cm LEFT ATRIUM             Index       RIGHT ATRIUM           Index LA diam:        4.00 cm 2.53 cm/m  RA Area:     13.90 cm LA Vol  (A2C):   74.9 ml 47.30 ml/m RA Volume:   37.30 ml  23.55 ml/m LA Vol (A4C):   42.6 ml 26.90 ml/m LA Biplane Vol: 56.1 ml 35.43 ml/m   AORTA Ao Root diam: 2.80 cm MITRAL VALVE               TRICUSPID VALVE MV Area (PHT): 2.91 cm    TR Peak grad:   37.9 mmHg MV Decel Time: 261 msec    TR Vmax:        308.00 cm/s MR Peak grad: 42.2 mmHg MR Mean grad: 29.0 mmHg    SHUNTS MR Vmax:      325.00 cm/s  Systemic Diam: 2.10 cm MR Vmean:     254.0 cm/s MV E velocity: 65.50 cm/s MV A velocity: 41.70 cm/s MV E/A ratio:  1.57 Kate Sable MD Electronically signed by Kate Sable MD Signature Date/Time: 12/09/2019/3:20:59 PM    Final    ECHOCARDIOGRAM LIMITED  Result Date: 12/11/2019    ECHOCARDIOGRAM LIMITED REPORT   Patient Name:   ADEOLUWA SILVERS Date of Exam: 12/11/2019 Medical Rec #:  [id number]   Height:       63.0 in Accession #:    [id number]  Weight:       119.6 lb Date of Birth:  [birth year]  BSA:          1.554 m Patient Age:    77 years    BP:           114/81 mmHg Patient Gender: M           HR:           73 bpm. Exam Location:  [facility] Procedure: Limited Echo Indications:    Dyspnea 786.09 / R06.00  History:        Patient has prior history of Echocardiogram examinations, most                 recent 12/09/2019. Previous Myocardial Infarction,                 Arrythmias:Atrial Fibrillation; Risk Factors:Current Smoker and                 Hypertension. Rhabdomyolysis.  Sonographer:    Leavy Cella RDCS (AE) Referring Phys: [id number] [place]  1. Left ventricular ejection fraction, by estimation, is 40%. The left ventricle demonstrates global hypokinesis.  2. The inferior vena cava is normal in size with greater than 50% respiratory variability, suggesting right atrial pressure of 3 mmHg.  3. Limited echo to evaluate LV function. Echocontrast was used to enhance visualization. FINDINGS  Left Ventricle: Left ventricular ejection fraction, by estimation, is 40%. The left ventricle  demonstrates global hypokinesis. Definity contrast agent was given IV to delineate the left ventricular endocardial borders. Pericardium: Trivial pericardial effusion is present. The pericardial effusion is circumferential. Venous: The inferior vena cava is normal in size with greater than 50% respiratory variability, suggesting right atrial pressure of 3 mmHg.  Carlyle Dolly MD Electronically signed by Carlyle Dolly MD Signature Date/Time: 12/11/2019/3:12:49 PM    Final      CBC Recent Labs  Lab 12/09/19 1115 12/10/19 3428 12/11/19 0609 12/12/19 0404 12/13/19 0412  WBC 7.9 7.0 7.9 8.2 7.5  HGB 8.8* 8.0* 7.9* 7.8* 8.4*  HCT 27.6* 25.1* 24.5* 24.7* 26.7*  PLT 456* 419* 408* 425* 416*  MCV 98.9 98.8 97.2 97.6 97.4  MCH 31.5 31.5 31.3 30.8 30.7  MCHC 31.9 31.9 32.2 31.6 31.5  RDW 14.8 14.6 14.6 14.4 14.6  LYMPHSABS 1.6  --   --   --   --   MONOABS 0.6  --   --   --   --   EOSABS 0.1  --   --   --   --   BASOSABS 0.1  --   --   --   --     Chemistries  Recent Labs  Lab 12/09/19 1115 12/10/19 0613 12/11/19 0849 12/12/19 0404 12/13/19 0412  NA 133* 134* 134* 135  134* 132*  K 4.1 4.0 3.3* 4.8  4.7 4.4  CL 104 104 102 104  104 104  CO2 20* 20* 22 21*  21* 22  GLUCOSE 107* 109* 92 78  77 85  BUN 32* 45* 46* 42*  43* 36*  CREATININE 2.05* 2.43* 2.55* 2.44*  2.43* 2.26*  CALCIUM 9.6 9.3 9.3 9.3  9.2 9.0  AST 31  --   --   --   --   ALT 13  --   --   --   --   ALKPHOS 76  --   --   --   --   BILITOT 0.4  --   --   --   --    ------------------------------------------------------------------------------------------------------------------ No results for input(s): CHOL, HDL, LDLCALC, TRIG, CHOLHDL, LDLDIRECT in the last 72 hours.  No results found for: HGBA1C ------------------------------------------------------------------------------------------------------------------ No results for input(s): TSH, T4TOTAL, T3FREE, THYROIDAB in the last 72 hours.  Invalid  input(s): FREET3 ------------------------------------------------------------------------------------------------------------------ No results for input(s): VITAMINB12, FOLATE, FERRITIN, TIBC, IRON, RETICCTPCT in the last 72 hours.  Coagulation profile Recent Labs  Lab 12/09/19 1749  INR NOT CALCULATED    No results for input(s): DDIMER in the last 72 hours.  Cardiac Enzymes No results for input(s): CKMB, TROPONINI, MYOGLOBIN in the last 168 hours.  Invalid input(s): CK ------------------------------------------------------------------------------------------------------------------    Component Value Date/Time   BNP 1,010.0 (H) 12/11/2019 7681     Kathie Dike M.D on 12/13/2019 at 2:18 PM  Go to [email or web address] - for contact info  [facility] - Office  [phone]

## 2019-12-13 NOTE — Progress Notes (Signed)
[facility]ane for heparin  Indication: chest pain/ACS  No Known Allergies  Patient Measurements: Height: 5\' 3"  (160 cm) Weight: 119 lb 11.4 oz (54.3 kg) IBW/kg (Calculated) : 56.9 Heparin Dosing Weight: 57 kg  Vital Signs: Temp: 98 F (36.7 C) (03/21 0516) Temp Source: Oral (03/21 0516) BP: 145/88 (03/21 0516) Pulse Rate: 78 (03/21 0516)  Labs: Recent Labs    12/10/19 2350 12/11/19 0609 12/11/19 0609 12/11/19 0849 12/11/19 0849 12/11/19 1803 12/12/19 0404 12/13/19 0412  HGB  --  7.9*   < >  --   --   --  7.8* 8.4*  HCT  --  24.5*  --   --   --   --  24.7* 26.7*  PLT  --  408*  --   --   --   --  425* 416*  APTT   < >  --   --  97*   < > 71* 99* 93*  HEPARINUNFRC  --   --   --   --   --   --  1.01* 0.92*  CREATININE  --   --   --  2.55*  --   --  2.44*  2.43* 2.26*   < > = values in this interval not displayed.    Estimated Creatinine Clearance: 23 mL/min (A) (by C-G formula based on SCr of 2.26 mg/dL (H)).   Assessment: Pt with multiple medical hx including CVA who was admitted for CP. He has been on apixaban for his CVA/afib. It was last taken 3/17 at 0800. IV heparin has been ordered to r/o MI. Will start IV heparin tonight after 12 hr from first dose. His heparin level will be elevated due to apixaban so we will use APTT for monitoring until it's correlated.   APTT came back therapeutic this morning 93. We will continue with the same rate. HL 0.92, not correlating yet   Goal of Therapy:  Heparin level 0.3-0.7 units/ml  APTT 66-102 Monitor platelets by anticoagulation protocol: Yes   Plan:  Continue heparin infusion at 900 units/hr  Monitor daily heparin level/aPTT and s/s of bleeding.  Thomasenia Sales, PharmD, MBA, BCGP Clinical Pharmacist  12/13/2019 7:55 AM

## 2019-12-13 NOTE — Progress Notes (Signed)
Patient asleep at 19:30 on room air. Appeared to have no respiratory distress.

## 2019-12-14 LAB — APTT
aPTT: 108 seconds — ABNORMAL HIGH (ref 24–36)
aPTT: 55 seconds — ABNORMAL HIGH (ref 24–36)

## 2019-12-14 LAB — CBC
HCT: 26.4 % — ABNORMAL LOW (ref 39.0–52.0)
Hemoglobin: 8.9 g/dL — ABNORMAL LOW (ref 13.0–17.0)
MCH: 33 pg (ref 26.0–34.0)
MCHC: 33.7 g/dL (ref 30.0–36.0)
MCV: 97.8 fL (ref 80.0–100.0)
Platelets: 442 10*3/uL — ABNORMAL HIGH (ref 150–400)
RBC: 2.7 MIL/uL — ABNORMAL LOW (ref 4.22–5.81)
RDW: 14.3 % (ref 11.5–15.5)
WBC: 7 10*3/uL (ref 4.0–10.5)
nRBC: 0 % (ref 0.0–0.2)

## 2019-12-14 LAB — RENAL FUNCTION PANEL
Albumin: 2.6 g/dL — ABNORMAL LOW (ref 3.5–5.0)
Anion gap: 8 (ref 5–15)
BUN: 35 mg/dL — ABNORMAL HIGH (ref 8–23)
CO2: 20 mmol/L — ABNORMAL LOW (ref 22–32)
Calcium: 9.1 mg/dL (ref 8.9–10.3)
Chloride: 105 mmol/L (ref 98–111)
Creatinine, Ser: 2.32 mg/dL — ABNORMAL HIGH (ref 0.61–1.24)
GFR calc Af Amer: 32 mL/min — ABNORMAL LOW (ref >60)
GFR calc non Af Amer: 27 mL/min — ABNORMAL LOW (ref >60)
Glucose, Bld: 86 mg/dL (ref 70–99)
Phosphorus: 2.7 mg/dL (ref 2.5–4.6)
Potassium: 4.1 mmol/L (ref 3.5–5.1)
Sodium: 133 mmol/L — ABNORMAL LOW (ref 135–145)

## 2019-12-14 LAB — HEPARIN LEVEL (UNFRACTIONATED)
Heparin Unfractionated: 0.66 IU/mL (ref 0.30–0.70)
Heparin Unfractionated: 0.41 IU/mL (ref 0.30–0.70)

## 2019-12-14 MED ORDER — APIXABAN 2.5 MG PO TABS: 2.5000 mg | ORAL_TABLET | Freq: Two times a day (BID) | ORAL | Status: DC

## 2019-12-14 MED ORDER — ROSUVASTATIN CALCIUM 10 MG PO TABS
10.0000 mg | ORAL_TABLET | Freq: Every evening | ORAL | Status: DC
  Administered 2019-12-14 – 2019-12-20 (×7): 10 mg via ORAL
  Filled 2019-12-14 (×7): qty 1

## 2019-12-14 MED ORDER — HYDRALAZINE HCL 25 MG PO TABS
37.5000 mg | ORAL_TABLET | Freq: Three times a day (TID) | ORAL | Status: DC
  Administered 2019-12-14 – 2019-12-21 (×20): 37.5 mg via ORAL
  Filled 2019-12-14 (×21): qty 2

## 2019-12-14 MED ORDER — HEPARIN (PORCINE) 25000 UT/250ML-% IV SOLN
900.0000 [IU]/h | INTRAVENOUS | Status: DC
  Administered 2019-12-14: 800 [IU]/h via INTRAVENOUS

## 2019-12-14 MED ORDER — SODIUM CHLORIDE 0.9 % IV SOLN: INTRAVENOUS | Status: AC

## 2019-12-14 MED ORDER — FAMOTIDINE 20 MG PO TABS
10.0000 mg | ORAL_TABLET | Freq: Every day | ORAL | Status: DC
  Administered 2019-12-14 – 2019-12-21 (×8): 10 mg via ORAL
  Filled 2019-12-14 (×7): qty 1

## 2019-12-14 NOTE — Progress Notes (Signed)
.                                                                                   Patient Demographics:    Aqib Lough, is a 74 y.o. male, DOB - 12-19-1945, IRS:[id number]  Admit date - 12/09/2019   Admitting Physician Tatumn Corbridge Denton Brick, MD  Outpatient Primary MD for the patient is [name], MD  LOS - 5   Chief Complaint  Patient presents with  . Shortness of Breath        Subjective:    Basem Yannuzzi feels less short of breath -Able to get up with staff into the chair with some assistance No bleeding concerns  Assessment  & Plan :    Principal Problem:   Abnormal result of cardiovascular function study suggestive of non-ST elevation myocardial infarction (NSTEMI) Active Problems:   Acute respiratory failure with hypoxia (HCC)   Acute on chronic HFrEF (heart failure with reduced ejection fraction) (HCC)   AKI (acute kidney injury) on CKD IIIa   Atrial fibrillation (HCC)   Pneumonia   Non-ST elevation (NSTEMI) myocardial infarction (HCC)   Tobacco abuse   Acute on chronic combined systolic and diastolic CHF (congestive heart failure) (HCC)   PAF (paroxysmal atrial fibrillation) (HCC)   Essential hypertension   Bilateral carotid artery stenosis   Anemia   Stage 3 chronic kidney disease   Brief Summary 74 y.o. male with PMHx of Prior CVA with mild residual left-sided weakness, HTN, ongoing tobacco abuse, PAF on chronic anticoagulation with Eliquis, chronic anemia, recurrent falls syncope and status post 30-day event monitor placed on 09/16/2020 admitted on 12/09/2019 with concerns about possible NSTEMI and CHF exacerbation as well as possible superimposed right-sided pneumonia    A/p 1)Possible NSTEMI--- ???  Due to demand ischemia in the setting of CHF exacerbation --- on admission patient presented  with dyspnea, EKG with ST depression nonspecific ST and T wave changes -No chest pain, dyspnea at rest mostly resolved,, dyspnea on exertion has not resolved - echo with  EF down to 40% from 45 to 50% less than 3 months ago on 09/17/2019, echo today also shows global hypokinesis and grade 2 diastolic dysfunction, pulmonary hypertension -Troponin 4,269 >>  4,848 >>> 3,050 -Stop Eliquis, continue IV heparin started on 12/09/2019---cardiology service do not restart Eliquis until decision on possible LHC has been made most likely on 12/14/2019 -Continue Coreg, Crestor and aspirin -Cardiology consult appreciated, patient may need RHC/LHC when renal function permits(cardiology service would like to do gentle hydration to see if renal function will improve with IV fluids)  2)HFrEF---  patient admitted with acute on chronic combined systolic and diastolic CHF exacerbation  --- Admission chest x-ray with possible pulmonary edema and pleural fluid -Repeat chest x-ray on 12/11/2019 continue to suggest CHF superimposed on emphysema and possible right-sided pneumonia -Repeat echo on 12/09/2018 as noted above #1 -Repeat echo on 12/11/2019 suggest EF of 40% without evidence of significant volume overload -BNP  3459 >> 1010 -Elevated troponins as above --Standing weight 119,  baseline weight 125  -???  Slightly over diuresed, IV diuretics discontinued -Gentle IV fluids per cardiology service to see if renal function improves enough  to allow for LHC ACE/ARB therapy was not prescribed due to Worsening renal function. -Monitor daily weights and fluid input and output accurately  3)AKI----acute kidney injury on CKD stage - IIIa-worsening renal function might be due to CHF exacerbation with decreased renal perfusion   creatinine on admission= 2.0 , baseline creatinine = 1.4 (09/21/2019)    , creatinine 2.3 most likely due to diuresis, -- renally adjust medications, avoid nephrotoxic agents / dehydration  / hypotension --Continue sodium bicarb -Monitor renal function closely as #2 above -Holding further diuretics for now   4)Chronic normocytic and normochromic Anemia---  hemoglobin  currently stable at 8.9 -No evidence of ongoing bleeding, PTA  the patient was on Eliquis and now  switched to IV heparin -Stool occult blood Negative  5)History of PAFib/history of second-degree AV block/history of tachyarrhythmia or bradyarrhythmia--- patient had a 30-day event monitor placed on 11/18/2019 -Preliminary review of his 30-day event monitor shows no significant arrhythmias -Continue Coreg, Eliquis stopped due to NSTEMI, IV heparin for now  6) history of COVID-19 infection--- resolved, asymptomatic, repeat Covid test negative at this time  7)History of prior stroke with residual left-sided hemiparesis----anticoagulation as above #1 and #5 -Continue statin and aspirin  8) possible Rt Sided pneumonia-----no fevers, no leukocytosis, patient continues to have shortness of breath, some cough, admission chest x-ray findings with pneumonia versus pulmonary edema---  -Repeat chest x-ray on 12/11/2019 continues to suggest CHF superimposed on emphysema with possible concomitant right-sided pneumonia --Continue Rocephin and doxycycline started on 12/09/2019 and as well as mucolytics and bronchodilators for possible pneumonia  9)Tobacco abuse--- smoking cessation strongly advised, patient is not ready to quit  10) acute hypoxic respiratory failure--- secondary to #1,#2 and #8 above, continue supplemental oxygen, treat as above #1 and #2  11)Social/Ethics--- patient is a DNR/DNI, he is from [facility] where he has lived for over 6 months now  12)Generalized Weakness/deconditioning--PT eval appreciated recommends SNF rehab  Disposition/Need for in-Hospital Stay- patient unable to be discharged at this time due to --- acute CHF exacerbation requiring further  diuresis and IV heparin for possible NSTEMI -Anticipate discharge back to [facility] for discharge once acute medical issues resolved (Phy therapy recommends SNF) -Not medically ready for discharge at this time -He is to be  cleared by cardiology for discharge, may need cardiac cath prior to discharge (currently receiving IV fluids to see if renal function will improve enough to allow for [facility])  Code Status : DNR  Family Communication:    (patient is alert, awake and coherent) --- Plan of care discussed with patient's son  Consults  :  cardiology  DVT Prophylaxis  : IV heparin  Lab Results  Component Value Date   PLT 442 (H) 12/14/2019    Inpatient Medications  Scheduled Meds: . acetaminophen  650 mg Oral TID  . aspirin EC  81 mg Oral Q breakfast  . carvedilol  3.125 mg Oral BID WC  . cholecalciferol  1,000 Units Oral Daily  . doxycycline  100 mg Oral Q12H  . famotidine  10 mg Oral Daily  . guaiFENesin  600 mg Oral BID  . hydrALAZINE  37.5 mg Oral TID  . isosorbide mononitrate  15 mg Oral Daily  . loratadine  10 mg Oral Daily  . PARoxetine  20 mg Oral Daily  . rosuvastatin  10 mg Oral QPM  . sodium bicarbonate  650 mg Oral BID  . sodium chloride flush  3 mL Intravenous Q12H   Continuous Infusions: .  sodium chloride    . sodium chloride 75 mL/hr at 12/14/19 1207  . cefTRIAXone (ROCEPHIN)  IV 1 g (12/14/19 1335)  . heparin 800 Units/hr (12/14/19 1158)   PRN Meds:.sodium chloride, acetaminophen **OR** acetaminophen, alum & mag hydroxide-simeth, ipratropium-albuterol, ondansetron **OR** ondansetron (ZOFRAN) IV, polyethylene glycol, sodium chloride flush, traZODone    Anti-infectives (From admission, onward)   Start     Dose/Rate Route Frequency Ordered Stop   12/10/19 1400  cefTRIAXone (ROCEPHIN) 1 g in sodium chloride 0.9 % 100 mL IVPB     1 g 200 mL/hr over 30 Minutes Intravenous Every 24 hours 12/09/19 1741     12/09/19 2200  doxycycline (VIBRA-TABS) tablet 100 mg     100 mg Oral Every 12 hours 12/09/19 1741     12/09/19 1315  cefTRIAXone (ROCEPHIN) 1 g in sodium chloride 0.9 % 100 mL IVPB     1 g 200 mL/hr over 30 Minutes Intravenous  Once 12/09/19 1310 12/09/19 1445   12/09/19 1315   azithromycin (ZITHROMAX) 500 mg in sodium chloride 0.9 % 250 mL IVPB     500 mg 250 mL/hr over 60 Minutes Intravenous  Once 12/09/19 1310 12/09/19 1554        Objective:   Vitals:   12/14/19 0457 12/14/19 0904 12/14/19 1540 12/14/19 1630  BP:  (!) 147/83 140/85 135/82  Pulse:  78 71   Resp:  16 18   Temp:  (!) 97.4 F (36.3 C) 97.7 F (36.5 C)   TempSrc:  Oral Oral   SpO2:  97% 98%   Weight: 54 kg     Height:        Wt Readings from Last 3 Encounters:  12/14/19 54 kg  11/17/19 57.1 kg  10/01/19 85.3 kg     Intake/Output Summary (Last 24 hours) at 12/14/2019 1715 Last data filed at 12/14/2019 1158 Gross per 24 hour  Intake 1087.73 ml  Output 1050 ml  Net 37.73 ml   Physical Exam  Gen:- Awake Alert,  In no apparent distress  HEENT:- [place].AT, No sclera icterus Neck-Supple Neck,No JVD,.  Lungs-improved air movement, no rales CV- S1, S2 normal, regular , anterior chest wall with cardiac event monitor  abd-  +ve B.Sounds, Abd Soft, No tenderness,    Extremity/Skin:- No edema, pedal pulses present  Psych-affect is appropriate, oriented x3 Neuro-generalized weakness, no new focal deficits, no tremors .    Data Review:   Micro Results Recent Results (from the past 240 hour(s))  Respiratory Panel by RT PCR (Flu A&B, Covid) - Nasopharyngeal Swab     Status: None   Collection Time: 12/09/19  2:21 PM   Specimen: Nasopharyngeal Swab  Result Value Ref Range Status   SARS Coronavirus 2 by RT PCR NEGATIVE NEGATIVE Final    Comment: (NOTE) SARS-CoV-2 target nucleic acids are NOT DETECTED. The SARS-CoV-2 RNA is generally detectable in upper respiratoy specimens during the acute phase of infection. The lowest concentration of SARS-CoV-2 viral copies this assay can detect is 131 copies/mL. A negative result does not preclude SARS-Cov-2 infection and should not be used as the sole basis for treatment or other patient management decisions. A negative result may occur with   improper specimen collection/handling, submission of specimen other than nasopharyngeal swab, presence of viral mutation(s) within the areas targeted by this assay, and inadequate number of viral copies (<131 copies/mL). A negative result must be combined with clinical observations, patient history, and epidemiological information. The expected result is Negative. Fact Sheet for  Patients:  PinkCheek.be Fact Sheet for Healthcare Providers:  GravelBags.it This test is not yet ap proved or cleared by the [place] and  has been authorized for detection and/or diagnosis of SARS-CoV-2 by FDA under an Emergency Use Authorization (EUA). This EUA will remain  in effect (meaning this test can be used) for the duration of the COVID-19 declaration under Section 564(b)(1) of the Act, 21 U.S.C. section 360bbb-3(b)(1), unless the authorization is terminated or revoked sooner.    Influenza A by PCR NEGATIVE NEGATIVE Final   Influenza B by PCR NEGATIVE NEGATIVE Final    Comment: (NOTE) The Xpert Xpress SARS-CoV-2/FLU/RSV assay is intended as an aid in  the diagnosis of influenza from Nasopharyngeal swab specimens and  should not be used as a sole basis for treatment. Nasal washings and  aspirates are unacceptable for Xpert Xpress SARS-CoV-2/FLU/RSV  testing. Fact Sheet for Patients: PinkCheek.be Fact Sheet for Healthcare Providers: GravelBags.it This test is not yet approved or cleared by the [place] FDA and  has been authorized for detection and/or diagnosis of SARS-CoV-2 by  FDA under an Emergency Use Authorization (EUA). This EUA will remain  in effect (meaning this test can be used) for the duration of the  Covid-19 declaration under Section 564(b)(1) of the Act, 21  U.S.C. section 360bbb-3(b)(1), unless the authorization is  terminated or revoked. Performed at  [facility], [address], [place], [place] [address]     Radiology Reports DG Chest 2 View  Result Date: 12/11/2019 CLINICAL DATA:  Shortness of breath with atrial fibrillation and hypertension EXAM: CHEST - 2 VIEW COMPARISON:  December 09, 2019 chest radiograph and chest CT September 15, 2020 FINDINGS: Underlying emphysematous change is noted, most severe in the right upper lobe. There is a right pleural effusion with airspace opacity in the right lower lobe. There is atelectatic change in the left base. Heart is mildly enlarged. There is mild interstitial edema. Heart is slightly enlarged with pulmonary vascularity reflecting the underlying emphysema. There is aortic atherosclerosis. There is a loop recorder anteriorly. There is postoperative change in the left medial shoulder region. IMPRESSION: Findings most consistent with congestive heart failure superimposed on emphysematous change. Cardiomegaly is present with interstitial edema. Right pleural effusion. There is suspected superimposed right lower lobe pneumonia. There is aortic atherosclerosis. Aortic Atherosclerosis (ICD10-I70.0) and Emphysema (ICD10-J43.9). Electronically Signed   By: Lowella Grip III M.D.   On: 12/11/2019 11:14   DG Chest Portable 1 View  Result Date: 12/09/2019 CLINICAL DATA:  EMS reports pt is a residen at [facility]. Reports sob and cp since last night. Per Pts chart Pt has had COVID - unsure when. Hx A-fib, stroke, smoker EXAM: PORTABLE CHEST 1 VIEW COMPARISON:  09/15/2019 FINDINGS: Lungs density interstitial hazy airspace lung opacities, with more confluent opacity in the right lung base, obscuring the hemidiaphragm. Cardiac silhouette is normal in size.  Mediastinal hilar masses. Probable pleural effusions, larger on the right.  No pneumothorax. IMPRESSION: 1. Interstitial and hazy airspace lung opacities bilaterally with more confluent opacity noted in the right lung base. Findings may reflect multifocal pneumonia  or pulmonary edema. Suspect right lung base opacity due to a combination of pleural fluid with either atelectasis or more confluent infection. Electronically Signed   By: Lajean Manes M.D.   On: 12/09/2019 11:34   ECHOCARDIOGRAM COMPLETE  Result Date: 12/09/2019    ECHOCARDIOGRAM REPORT   Patient Name:   APOSTOLOS BLAGG Date of Exam: 12/09/2019 Medical Rec #:  [id number]   Height:       63.0 in Accession #:    [id number]  Weight:       125.0 lb Date of Birth:  1946/04/08  BSA:          1.584 m Patient Age:    72 years    BP:           140/78 mmHg Patient Gender: M           HR:           90 bpm. Exam Location:  [facility] Procedure: 2D Echo Indications:    Dyspnea 786.09 / R06.00  History:        Patient has prior history of Echocardiogram examinations, most                 recent 09/17/2019. Stroke, Arrythmias:Atrial Fibrillation; Risk                 Factors:Current Smoker. Rhabdomyolysis, Pneumonia.  Sonographer:    Leavy Cella RDCS (AE) Referring Phys: [id number] Leslie Jester IMPRESSIONS  1. Left ventricular ejection fraction, by estimation, is 40%. The left ventricle has moderately decreased function. The left ventricle demonstrates global hypokinesis. Left ventricular diastolic parameters are consistent with Grade II diastolic dysfunction (pseudonormalization).  2. Right ventricular systolic function is normal. The right ventricular size is normal. There is moderately elevated pulmonary artery systolic pressure.  3. The mitral valve is grossly normal. Mild mitral valve regurgitation.  4. Morphologically, there appears to be mild aortic stenosis. The aortic valve is tricuspid. Aortic valve regurgitation is not visualized.  5. The inferior vena cava is normal in size with greater than 50% respiratory variability, suggesting right atrial pressure of 3 mmHg. FINDINGS  Left Ventricle: Left ventricular ejection fraction, by estimation, is 40%. The left ventricle has moderately decreased function. The left  ventricle demonstrates global hypokinesis. The left ventricular internal cavity size was normal in size. There is no left ventricular hypertrophy. Left ventricular diastolic parameters are consistent with Grade II diastolic dysfunction (pseudonormalization). Indeterminate filling pressures. Right Ventricle: The right ventricular size is normal. No increase in right ventricular wall thickness. Right ventricular systolic function is normal. There is moderately elevated pulmonary artery systolic pressure. The tricuspid regurgitant velocity is 3.08 m/s, and with an assumed right atrial pressure of 10 mmHg, the estimated right ventricular systolic pressure is 43.1 mmHg. Left Atrium: Left atrial size was normal in size. Right Atrium: Right atrial size was normal in size. Pericardium: There is no evidence of pericardial effusion. Mitral Valve: The mitral valve is grossly normal. Mild mitral valve regurgitation. Tricuspid Valve: The tricuspid valve is grossly normal. Tricuspid valve regurgitation is mild. Aortic Valve: Morphologically, there appears to be mild aortic stenosis. The aortic valve is tricuspid. . There is mild thickening and mild calcification of the aortic valve. Aortic valve regurgitation is not visualized. Mild aortic valve annular calcification. There is mild thickening of the aortic valve. There is mild calcification of the aortic valve. Pulmonic Valve: The pulmonic valve was grossly normal. Pulmonic valve regurgitation is not visualized. Aorta: The aortic root is normal in size and structure. Venous: The inferior vena cava is normal in size with greater than 50% respiratory variability, suggesting right atrial pressure of 3 mmHg. IAS/Shunts: No atrial level shunt detected by color flow Doppler.  LEFT VENTRICLE PLAX 2D LVIDd:         4.76 cm  Diastology LVIDs:         4.10 cm  LV e' lateral:   6.96 cm/s LV PW:         0.84 cm  LV E/e' lateral: 9.4 LV IVS:        0.79 cm  LV e' medial:    5.98 cm/s LVOT  diam:     2.10 cm  LV E/e' medial:  11.0 LVOT Area:     3.46 cm  RIGHT VENTRICLE RV S prime:     11.60 cm/s TAPSE (M-mode): 1.7 cm LEFT ATRIUM             Index       RIGHT ATRIUM           Index LA diam:        4.00 cm 2.53 cm/m  RA Area:     13.90 cm LA Vol (A2C):   74.9 ml 47.30 ml/m RA Volume:   37.30 ml  23.55 ml/m LA Vol (A4C):   42.6 ml 26.90 ml/m LA Biplane Vol: 56.1 ml 35.43 ml/m   AORTA Ao Root diam: 2.80 cm MITRAL VALVE               TRICUSPID VALVE MV Area (PHT): 2.91 cm    TR Peak grad:   37.9 mmHg MV Decel Time: 261 msec    TR Vmax:        308.00 cm/s MR Peak grad: 42.2 mmHg MR Mean grad: 29.0 mmHg    SHUNTS MR Vmax:      325.00 cm/s  Systemic Diam: 2.10 cm MR Vmean:     254.0 cm/s MV E velocity: 65.50 cm/s MV A velocity: 41.70 cm/s MV E/A ratio:  1.57 Kate Sable MD Electronically signed by Kate Sable MD Signature Date/Time: 12/09/2019/3:20:59 PM    Final    ECHOCARDIOGRAM LIMITED  Result Date: 12/11/2019    ECHOCARDIOGRAM LIMITED REPORT   Patient Name:   ZYON ROSSER Date of Exam: 12/11/2019 Medical Rec #:  [id number]   Height:       63.0 in Accession #:    [id number]  Weight:       119.6 lb Date of Birth:  10-15-1945  BSA:          1.554 m Patient Age:    63 years    BP:           114/81 mmHg Patient Gender: M           HR:           73 bpm. Exam Location:  [facility] Procedure: Limited Echo Indications:    Dyspnea 786.09 / R06.00  History:        Patient has prior history of Echocardiogram examinations, most                 recent 12/09/2019. Previous Myocardial Infarction,                 Arrythmias:Atrial Fibrillation; Risk Factors:Current Smoker and                 Hypertension. Rhabdomyolysis.  Sonographer:    Leavy Cella RDCS (AE) Referring Phys: [id number] [place]  1. Left ventricular ejection fraction, by estimation, is 40%. The left ventricle demonstrates global hypokinesis.  2. The inferior vena cava is normal in size with greater than 50%  respiratory variability, suggesting right atrial pressure of 3 mmHg.  3. Limited echo to evaluate LV function. Echocontrast was used to enhance visualization. FINDINGS  Left Ventricle: Left ventricular ejection fraction, by  estimation, is 40%. The left ventricle demonstrates global hypokinesis. Definity contrast agent was given IV to delineate the left ventricular endocardial borders. Pericardium: Trivial pericardial effusion is present. The pericardial effusion is circumferential. Venous: The inferior vena cava is normal in size with greater than 50% respiratory variability, suggesting right atrial pressure of 3 mmHg. Carlyle Dolly MD Electronically signed by Carlyle Dolly MD Signature Date/Time: 12/11/2019/3:12:49 PM    Final      CBC Recent Labs  Lab 12/09/19 1115 12/09/19 1115 12/10/19 4650 12/11/19 3546 12/12/19 0404 12/13/19 0412 12/14/19 0445  WBC 7.9   < > 7.0 7.9 8.2 7.5 7.0  HGB 8.8*   < > 8.0* 7.9* 7.8* 8.4* 8.9*  HCT 27.6*   < > 25.1* 24.5* 24.7* 26.7* 26.4*  PLT 456*   < > 419* 408* 425* 416* 442*  MCV 98.9   < > 98.8 97.2 97.6 97.4 97.8  MCH 31.5   < > 31.5 31.3 30.8 30.7 33.0  MCHC 31.9   < > 31.9 32.2 31.6 31.5 33.7  RDW 14.8   < > 14.6 14.6 14.4 14.6 14.3  LYMPHSABS 1.6  --   --   --   --   --   --   MONOABS 0.6  --   --   --   --   --   --   EOSABS 0.1  --   --   --   --   --   --   BASOSABS 0.1  --   --   --   --   --   --    < > = values in this interval not displayed.    Chemistries  Recent Labs  Lab 12/09/19 1115 12/09/19 1115 12/10/19 5681 12/11/19 0849 12/12/19 0404 12/13/19 0412 12/14/19 0445  NA 133*   < > 134* 134* 135  134* 132* 133*  K 4.1   < > 4.0 3.3* 4.8  4.7 4.4 4.1  CL 104   < > 104 102 104  104 104 105  CO2 20*   < > 20* 22 21*  21* 22 20*  GLUCOSE 107*   < > 109* 92 78  77 85 86  BUN 32*   < > 45* 46* 42*  43* 36* 35*  CREATININE 2.05*   < > 2.43* 2.55* 2.44*  2.43* 2.26* 2.32*  CALCIUM 9.6   < > 9.3 9.3 9.3  9.2 9.0 9.1   AST 31  --   --   --   --   --   --   ALT 13  --   --   --   --   --   --   ALKPHOS 76  --   --   --   --   --   --   BILITOT 0.4  --   --   --   --   --   --    < > = values in this interval not displayed.   ------------------------------------------------------------------------------------------------------------------ No results for input(s): CHOL, HDL, LDLCALC, TRIG, CHOLHDL, LDLDIRECT in the last 72 hours.  No results found for: HGBA1C ------------------------------------------------------------------------------------------------------------------ No results for input(s): TSH, T4TOTAL, T3FREE, THYROIDAB in the last 72 hours.  Invalid input(s): FREET3 ------------------------------------------------------------------------------------------------------------------ No results for input(s): VITAMINB12, FOLATE, FERRITIN, TIBC, IRON, RETICCTPCT in the last 72 hours.  Coagulation profile Recent Labs  Lab 12/09/19 1749  INR NOT CALCULATED    No results for input(s): DDIMER in the last  72 hours.  Cardiac Enzymes No results for input(s): CKMB, TROPONINI, MYOGLOBIN in the last 168 hours.  Invalid input(s): CK ------------------------------------------------------------------------------------------------------------------    Component Value Date/Time   BNP 1,010.0 (H) 12/11/2019 7948     Roxan Hockey M.D on 12/14/2019 at 5:15 PM  Go to [email or web address] - for contact info  [facility] - Office  [phone]

## 2019-12-14 NOTE — Progress Notes (Addendum)
Progress Note  Patient Name: Anthony Andrews Date of Encounter: 12/14/2019  Primary Cardiologist: Quay Burow, MD   Subjective   Denies chest pain, still coughing but breathing better. Lives alone, doesn't drive, can walk 1-2 blocks, very inactive  Inpatient Medications    Scheduled Meds: . acetaminophen  650 mg Oral TID  . aspirin EC  81 mg Oral Q breakfast  . carvedilol  3.125 mg Oral BID WC  . cholecalciferol  1,000 Units Oral Daily  . doxycycline  100 mg Oral Q12H  . famotidine  20 mg Oral Daily  . guaiFENesin  600 mg Oral BID  . hydrALAZINE  25 mg Oral TID  . isosorbide mononitrate  15 mg Oral Daily  . loratadine  10 mg Oral Daily  . PARoxetine  20 mg Oral Daily  . rosuvastatin  20 mg Oral QPM  . sodium bicarbonate  650 mg Oral BID  . sodium chloride flush  3 mL Intravenous Q12H   Continuous Infusions: . sodium chloride    . cefTRIAXone (ROCEPHIN)  IV 1 g (12/13/19 1443)  . heparin 900 Units/hr (12/13/19 1123)   PRN Meds: sodium chloride, acetaminophen **OR** acetaminophen, alum & mag hydroxide-simeth, ipratropium-albuterol, ondansetron **OR** ondansetron (ZOFRAN) IV, polyethylene glycol, sodium chloride flush, traZODone   Vital Signs    Vitals:   12/13/19 1435 12/13/19 2111 12/14/19 0453 12/14/19 0457  BP: 122/64 (!) 144/74 (!) 142/82   Pulse: 75 77 79   Resp: 17 20 20    Temp: 98 F (36.7 C) 98.2 F (36.8 C) 97.9 F (36.6 C)   TempSrc: Oral Oral Oral   SpO2: 97% 100% 96%   Weight:    54 kg  Height:        Intake/Output Summary (Last 24 hours) at 12/14/2019 0751 Last data filed at 12/14/2019 0456 Gross per 24 hour  Intake 1084.73 ml  Output 1050 ml  Net 34.73 ml   Last 3 Weights 12/14/2019 12/13/2019 12/12/2019  Weight (lbs) 119 lb 0.8 oz 119 lb 11.4 oz 122 lb  Weight (kg) 54 kg 54.3 kg 55.339 kg      Telemetry    NSR - Personally Reviewed  ECG    NSR with slight lateral ST depression - Personally Reviewed  Physical Exam    GEN: No acute  distress.   Neck: No JVD Cardiac: RRR, no murmurs, rubs, or gallops.  Respiratory: Decrease breath sounds with scattered rhonchi throughout GI: Soft, nontender, non-distended  MS: No edema; No deformity. Neuro:  Nonfocal  Psych: Normal affect   Labs    High Sensitivity Troponin:   Recent Labs  Lab 12/09/19 1115 12/09/19 1442 12/10/19 0613  TROPONINIHS 4,269* 4,848* 3,050*      Chemistry Recent Labs  Lab 12/09/19 1115 12/10/19 5809 12/12/19 0404 12/13/19 0412 12/14/19 0445  NA 133*   < > 135  134* 132* 133*  K 4.1   < > 4.8  4.7 4.4 4.1  CL 104   < > 104  104 104 105  CO2 20*   < > 21*  21* 22 20*  GLUCOSE 107*   < > 78  77 85 86  BUN 32*   < > 42*  43* 36* 35*  CREATININE 2.05*   < > 2.44*  2.43* 2.26* 2.32*  CALCIUM 9.6   < > 9.3  9.2 9.0 9.1  PROT 6.6  --   --   --   --   ALBUMIN 2.9*  --  2.6*  --  2.6*  AST 31  --   --   --   --   ALT 13  --   --   --   --   ALKPHOS 76  --   --   --   --   BILITOT 0.4  --   --   --   --   GFRNONAA 32*   < > 26*  26* 28* 27*  GFRAA 37*   < > 30*  30* 33* 32*  ANIONGAP 9   < > 10  9 6 8    < > = values in this interval not displayed.     Hematology Recent Labs  Lab 12/12/19 0404 12/13/19 0412 12/14/19 0445  WBC 8.2 7.5 7.0  RBC 2.53* 2.74* 2.70*  HGB 7.8* 8.4* 8.9*  HCT 24.7* 26.7* 26.4*  MCV 97.6 97.4 97.8  MCH 30.8 30.7 33.0  MCHC 31.6 31.5 33.7  RDW 14.4 14.6 14.3  PLT 425* 416* 442*    BNP Recent Labs  Lab 12/09/19 1419 12/11/19 0609  BNP 3,459.0* 1,010.0*     DDimer No results for input(s): DDIMER in the last 168 hours.   Radiology    No results found.  Cardiac Studies   Limited echo 12/11/19 IMPRESSIONS     1. Left ventricular ejection fraction, by estimation, is 40%. The left  ventricle demonstrates global hypokinesis.   2. The inferior vena cava is normal in size with greater than 50%  respiratory variability, suggesting right atrial pressure of 3 mmHg.   3. Limited echo to  evaluate LV function. Echocontrast was used to enhance  visualization.   FINDINGS   Left Ventricle: Left ventricular ejection fraction, by estimation, is  40%. The left ventricle demonstrates global hypokinesis. Definity contrast  agent was given IV to delineate the left ventricular endocardial borders.   Pericardium: Trivial pericardial effusion is present. The pericardial  effusion is circumferential.   Venous: The inferior vena cava is normal in size with greater than 50%  respiratory variability, suggesting right atrial pressure of 3 mmHg.   Carlyle Dolly MD  Electronically signed by Carlyle Dolly MD  Signature Date/Time: 12/11/2019/3:12:49 PM    Echo 12/09/19   1. Left ventricular ejection fraction, by estimation, is 40%. The left  ventricle has moderately decreased function. The left ventricle  demonstrates global hypokinesis. Left ventricular diastolic parameters are  consistent with Grade II diastolic  dysfunction (pseudonormalization).   2. Right ventricular systolic function is normal. The right ventricular  size is normal. There is moderately elevated pulmonary artery systolic  pressure.   3. The mitral valve is grossly normal. Mild mitral valve regurgitation.   4. Morphologically, there appears to be mild aortic stenosis. The aortic  valve is tricuspid. Aortic valve regurgitation is not visualized.   5. The inferior vena cava is normal in size with greater than 50%  respiratory variability, suggesting right atrial pressure of 3 mmHg.   FINDINGS   Left Ventricle: Left ventricular ejection fraction, by estimation, is  40%. The left ventricle has moderately decreased function. The left  ventricle demonstrates global hypokinesis. The left ventricular internal  cavity size was normal in size. There is  no left ventricular hypertrophy. Left ventricular diastolic parameters are  consistent with Grade II diastolic dysfunction (pseudonormalization).  Indeterminate  filling pressures.   Right Ventricle: The right ventricular size is normal. No increase in  right ventricular wall thickness. Right ventricular systolic function is  normal. There is moderately elevated pulmonary artery systolic  pressure.  The tricuspid regurgitant velocity is  3.08 m/s, and with an assumed right atrial pressure of 10 mmHg, the  estimated right ventricular systolic pressure is 69.6 mmHg.   Left Atrium: Left atrial size was normal in size.   Right Atrium: Right atrial size was normal in size.   Pericardium: There is no evidence of pericardial effusion.   Mitral Valve: The mitral valve is grossly normal. Mild mitral valve  regurgitation.   Tricuspid Valve: The tricuspid valve is grossly normal. Tricuspid valve  regurgitation is mild.   Aortic Valve: Morphologically, there appears to be mild aortic stenosis.  The aortic valve is tricuspid. . There is mild thickening and mild  calcification of the aortic valve. Aortic valve regurgitation is not  visualized. Mild aortic valve annular  calcification. There is mild thickening of the aortic valve. There is mild  calcification of the aortic valve.   Pulmonic Valve: The pulmonic valve was grossly normal. Pulmonic valve  regurgitation is not visualized.   Aorta: The aortic root is normal in size and structure.   Venous: The inferior vena cava is normal in size with greater than 50%  respiratory variability, suggesting right atrial pressure of 3 mmHg.   IAS/Shunts: No atrial level shunt detected by color flow Doppler.     Patient Profile     74 y.o. male  with past medical history of HTN, HLD, carotid artery stenosis, paroxysmal atrial fibrillation (diagnosed in 08/2019 with episodes of secondary AV block during admission), cardiomyopathy (EF 45-50% by echo in 08/2019), chronic anemia and prior CVA who is being seen  for the evaluation of NSTEMI at the request of Dr. Denton Brick.       Assessment & Plan    1. 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  - baseline weight 119- 125 lbs, admitted 132 lbs and BNP 3459 and CXR consistent with edema - negative 365 yesterday, I/Os incomplete from admission. Weight 119 lbs which seems to be dry weight.. Lasix on hold, Cr up to 2.32 today.    Medical therapy with coreg 3.125mg  bid.  No ACE/ARB/ARNI/aldactone due to renal dysfunction.  on hydral 25mg  tid, imdur 15mg  daily.       2. Elevated troponins - peak trop 4800 trending down. - echo with relative decrease in LVE to 40% - EKG nonspecific ST/T changes - no specific chest pains   - medical therapy for possible ACS with hep gtt, ASA 81, coreg 3.125mg  bid, crestor 20. No ACE/ARB/ARNI/aldactone due to renal dysfunction.    - would consider cath pending renal function and anemia, not a candidate at this time. Has had no chest pain. Would continue medical therapy and re-evaluate as an outpatient. Would stop heparin and restart eliquis.     3. PAF - rate contorlled, has been on SR - eliquis on hold while on hep gtt-would restart Eliquis but will need to be on 2.5 mg bid with weight < 60 kg and Crt > 1.5.   4. AKI on CKD 3 - baseline creatinine 1.4 - 1.7. At 2.05 on admission, at 2.32 today.     5. Anemia  - chronic, some up and down fluctutions this admit. Has not had steady downtrend or signs of bleeding.      For questions or updates, please contact [place] Please consult [email or web address] for contact info under  Signed, Ermalinda Barrios, PA-C  12/14/2019, 7:51 AM    Attending note Patient seen and discussed with PA Bonnell Public, I agree with her documentaiton. Admitted with acute on chronic systolic/diastolic HF. EFpreviously45-50% by echo in 08/2019, at 40% by repeat imaging this admission with Grade 2 DD and global HK. Baseline weight reported 125 lbs, down to 119 lbs this  admission. I/Os incomplete this admission. IV lasix stopped due significant weight loss and rise in Cr, remains 2.2 to 2.3, baseline is 1.2 to 1.3. Diuretics remain on hold. Off O2 since I last saw him Friday, sats high 90s on room air.  Trop up to 4800 trending down, with AKI and some anemia this admission have not pursued cath.   In general his volume status was difficult to assess due to mixed data. The collection of current data supporst he is euvolemic and perhaps over diuresed. His breathing improved over the weekend to where he is off O2 without any diuresis over the weekend, but ongoing abx for possible pnuemonia. Will give gentle IVFs today to see if AKI will resolve to the point we could consider cath. Would continue heparin, not ready to convert to eliquis just yet  Possible pneumonia, abx per primary team.    Carlyle Dolly MD

## 2019-12-14 NOTE — TOC Initial Note (Signed)
Transition of Care [facility]) - Initial/Assessment Note    Patient Details  Name: Anthony Andrews MRN: [id number] Date of Birth: [birth year]  [facility]) CM/SW Contact:    Boneta Lucks, RN Phone Number: 12/14/2019, 11:01 AM  Clinical Narrative:      Patient admitted with abnormal Cardiovascular function. Patient is from [place]. Spoke to [employer]. Discharge plan to go back to [facility] tomorrow. PT eval recommending SNF. Updated Debbie  Patient needs another COVID test RN and MD updated. TOC to follow.            Expected Discharge Plan: Skilled Nursing Facility Barriers to Discharge: Continued Medical Work up   Patient Goals and CMS Choice Patient states their goals for this hospitalization and ongoing recovery are:: to returne to SNF CMS Medicare.gov Compare Post Acute Care list provided to:: Patient Represenative (must comment) Choice offered to / list presented to : Adult Children  Expected Discharge Plan and Services Expected Discharge Plan: [place] Acute Care Choice: [place] Living arrangements for the past 2 months: [place]                    Prior Living Arrangements/Services Living arrangements for the past 2 months: [place] Lives with:: Facility Resident          Need for Family Participation in Patient Care: Yes (Comment) Care giver support system in place?: Yes (comment)   Criminal Activity/Legal Involvement Pertinent to Current Situation/Hospitalization: No - Comment as needed  Activities of Daily Living Home Assistive Devices/Equipment: Dentures (specify type), Walker (specify type) ADL Screening (condition at time of admission) Patient's cognitive ability adequate to safely complete daily activities?: Yes Is the patient deaf or have difficulty hearing?: No Does the patient have difficulty seeing, even when wearing glasses/contacts?: No Does the patient have difficulty  concentrating, remembering, or making decisions?: No Patient able to express need for assistance with ADLs?: Yes Does the patient have difficulty dressing or bathing?: No Independently performs ADLs?: Yes (appropriate for developmental age) Does the patient have difficulty walking or climbing stairs?: Yes Weakness of Legs: Both Weakness of Arms/Hands: None  Permission Sought/Granted     Emotional Assessment      Alcohol / Substance Use: Not Applicable Psych Involvement: No (comment)  Admission diagnosis:  Shortness of breath [R06.02] Pneumonia [J18.9] NSTEMI (non-ST elevated myocardial infarction) [facility]) [I21.4] Patient Active Problem List   Diagnosis Date Noted  . Acute on chronic combined systolic and diastolic CHF (congestive heart failure) ([place])   . PAF (paroxysmal atrial fibrillation) ([place])   . Essential hypertension   . Bilateral carotid artery stenosis   . Anemia   . Stage 3 chronic kidney disease   . Pneumonia 12/09/2019  . Abnormal result of cardiovascular function study suggestive of non-ST elevation myocardial infarction (NSTEMI) 12/09/2019  . Acute on chronic HFrEF (heart failure with reduced ejection fraction) ([place]) 12/09/2019  . Non-ST elevation (NSTEMI) myocardial infarction ([place]) 12/09/2019  . Tobacco abuse 12/09/2019  . Acute respiratory failure with hypoxia ([place]) 09/15/2019  . Atrial fibrillation ([place]) 09/15/2019  . AKI (acute kidney injury) on CKD IIIa 09/12/2019  . Acute metabolic encephalopathy 19/14/7829  . Chronic hyponatremia 09/12/2019  . Metabolic acidosis 56/21/3086  . Rhabdomyolysis 09/11/2019   PCP:  [name], MD Pharmacy:  No Pharmacies Listed   Readmission Risk Interventions Readmission Risk Prevention Plan 12/14/2019  Transportation Screening Complete  PCP or Specialist Appt within 3-5 Days Not  Complete  HRI or Home Care Consult Not Complete  Social Work Consult for [place] Planning/Counseling Complete  Palliative Care  Screening Not Complete  Medication Review Press photographer) Complete

## 2019-12-14 NOTE — Progress Notes (Signed)
ANTICOAGULATION CONSULT NOTE   Pharmacy Consult for heparin  Indication: chest pain/ACS/possible NSTEMI  No Known Allergies  Patient Measurements: Height: 5\' 3"  (160 cm) Weight: 119 lb 0.8 oz (54 kg) IBW/kg (Calculated) : 56.9 Heparin Dosing Weight: 57 kg  Vital Signs: Temp: 97.9 F (36.6 C) (03/22 0453) Temp Source: Oral (03/22 0453) BP: 142/82 (03/22 0453) Pulse Rate: 79 (03/22 0453)  Labs: Recent Labs    12/12/19 0404 12/12/19 0404 12/13/19 0412 12/14/19 0445  HGB 7.8*   < > 8.4* 8.9*  HCT 24.7*  --  26.7* 26.4*  PLT 425*  --  416* 442*  APTT 99*  --  93* 108*  HEPARINUNFRC 1.01*  --  0.92* 0.66  CREATININE 2.44*  2.43*  --  2.26* 2.32*   < > = values in this interval not displayed.    Estimated Creatinine Clearance: 22.3 mL/min (A) (by C-G formula based on SCr of 2.32 mg/dL (H)).   Assessment: Pt with multiple medical hx including CVA who was admitted for CP. He has been on apixaban for his CVA/afib. It was last taken 3/17 at 0800. IV heparin has been ordered to r/o MI. Will start IV heparin tonight after 12 hr from first dose. His heparin level will be elevated due to apixaban so we will use APTT for monitoring until it's correlated.   APTT 108- slightly supratherapeutic HL 0.66- therapeutic  Goal of Therapy:  Heparin level 0.3-0.7 units/ml  APTT 66-102 Monitor platelets by anticoagulation protocol: Yes   Plan:  Decrease heparin infusion to 800 units/hr  Heparin level in 6-8 hours  Monitor daily heparin level/aPTT and s/s of bleeding.  Margot Ables, PharmD Clinical Pharmacist 12/14/2019 7:42 AM

## 2019-12-14 NOTE — Care Management Important Message (Signed)
Important Message  Patient Details  Name: Anthony Andrews MRN: [id number] Date of Birth: 12-Jan-1946   Medicare Important Message Given:  Yes     [name] 12/14/2019, 2:02 PM

## 2019-12-14 NOTE — Progress Notes (Signed)
ANTICOAGULATION CONSULT NOTE   Pharmacy Consult for heparin  Indication: chest pain/ACS/possible NSTEMI  No Known Allergies  Patient Measurements: Height: 5\' 3"  (160 cm) Weight: 119 lb 0.8 oz (54 kg) IBW/kg (Calculated) : 56.9 Heparin Dosing Weight: 57 kg  Vital Signs: Temp: 97.7 F (36.5 C) (03/22 1540) Temp Source: Oral (03/22 1540) BP: 135/82 (03/22 1630) Pulse Rate: 71 (03/22 1540)  Labs: Recent Labs    12/12/19 0404 12/12/19 0404 12/13/19 0412 12/14/19 0445 12/14/19 1621  HGB 7.8*   < > 8.4* 8.9*  --   HCT 24.7*  --  26.7* 26.4*  --   PLT 425*  --  416* 442*  --   APTT 99*   < > 93* 108* 55*  HEPARINUNFRC 1.01*   < > 0.92* 0.66 0.41  CREATININE 2.44*  2.43*  --  2.26* 2.32*  --    < > = values in this interval not displayed.    Estimated Creatinine Clearance: 22.3 mL/min (A) (by C-G formula based on SCr of 2.32 mg/dL (H)).   Assessment: Pt with multiple medical hx including CVA who was admitted for CP. He has been on apixaban for his CVA/afib. It was last taken 3/17 at 0800. IV heparin has been ordered to r/o MI. Will start IV heparin tonight after 12 hr from first dose.Heparin level and APTT correlating. Will continue with just heparin level daily.   HL 0.41- therapeutic  Goal of Therapy:  Heparin level 0.3-0.7 units/ml  Monitor platelets by anticoagulation protocol: Yes   Plan:  Continue heparin infusion at 800 units/hr  Monitor daily heparin level Monitor for s/s of bleeding.  Isac Sarna, BS Pharm D, BCPS Clinical Pharmacist Pager [phone] 12/14/2019 5:00 PM

## 2019-12-14 NOTE — Progress Notes (Deleted)
[place] for heparin >>apixaban Indication: afib  No Known Allergies  Patient Measurements: Height: 5\' 3"  (160 cm) Weight: 119 lb 0.8 oz (54 kg) IBW/kg (Calculated) : 56.9 Heparin Dosing Weight: 57 kg  Vital Signs: Temp: 97.4 F (36.3 C) (03/22 0904) Temp Source: Oral (03/22 0904) BP: 147/83 (03/22 0904) Pulse Rate: 78 (03/22 0904)  Labs: Recent Labs    12/12/19 0404 12/12/19 0404 12/13/19 0412 12/14/19 0445  HGB 7.8*   < > 8.4* 8.9*  HCT 24.7*  --  26.7* 26.4*  PLT 425*  --  416* 442*  APTT 99*  --  93* 108*  HEPARINUNFRC 1.01*  --  0.92* 0.66  CREATININE 2.44*  2.43*  --  2.26* 2.32*   < > = values in this interval not displayed.    Estimated Creatinine Clearance: 22.3 mL/min (A) (by C-G formula based on SCr of 2.32 mg/dL (H)).   Assessment: Pt with multiple medical hx including CVA who was admitted for CP. He has been on apixaban for his CVA/afib. It was last taken 3/17 at 0800. IV heparin has been ordered to r/o MI.   Not candidate for cath at this time- restart home Eliquis at reduced dose due to renal function/weight.   Goal of Therapy:  Monitor platelets by anticoagulation protocol: Yes   Plan:  Stop heparin infusion. Start apxiaban 2.5 mg twice daily.  Monitor labs and s/s of bleeding.  Margot Ables, PharmD Clinical Pharmacist 12/14/2019 10:51 AM

## 2019-12-15 ENCOUNTER — Inpatient Hospital Stay (HOSPITAL_COMMUNITY): Payer: Medicare PPO

## 2019-12-15 DIAGNOSIS — U071 COVID-19: Secondary | ICD-10-CM

## 2019-12-15 DIAGNOSIS — J069 Acute upper respiratory infection, unspecified: Secondary | ICD-10-CM

## 2019-12-15 LAB — BASIC METABOLIC PANEL
Anion gap: 6 (ref 5–15)
BUN: 29 mg/dL — ABNORMAL HIGH (ref 8–23)
CO2: 20 mmol/L — ABNORMAL LOW (ref 22–32)
Calcium: 9.2 mg/dL (ref 8.9–10.3)
Chloride: 106 mmol/L (ref 98–111)
Creatinine, Ser: 1.97 mg/dL — ABNORMAL HIGH (ref 0.61–1.24)
GFR calc Af Amer: 38 mL/min — ABNORMAL LOW (ref >60)
GFR calc non Af Amer: 33 mL/min — ABNORMAL LOW (ref >60)
Glucose, Bld: 89 mg/dL (ref 70–99)
Potassium: 3.9 mmol/L (ref 3.5–5.1)
Sodium: 132 mmol/L — ABNORMAL LOW (ref 135–145)

## 2019-12-15 LAB — CBC
HCT: 27.3 % — ABNORMAL LOW (ref 39.0–52.0)
Hemoglobin: 8.8 g/dL — ABNORMAL LOW (ref 13.0–17.0)
MCH: 31.9 pg (ref 26.0–34.0)
MCHC: 32.2 g/dL (ref 30.0–36.0)
MCV: 98.9 fL (ref 80.0–100.0)
Platelets: 376 10*3/uL (ref 150–400)
RBC: 2.76 MIL/uL — ABNORMAL LOW (ref 4.22–5.81)
RDW: 14.3 % (ref 11.5–15.5)
WBC: 6.7 10*3/uL (ref 4.0–10.5)
nRBC: 0.3 % — ABNORMAL HIGH (ref 0.0–0.2)

## 2019-12-15 LAB — RESPIRATORY PANEL BY RT PCR (FLU A&B, COVID)
Influenza A by PCR: NEGATIVE
Influenza B by PCR: NEGATIVE
SARS Coronavirus 2 by RT PCR: POSITIVE — AB

## 2019-12-15 LAB — HEPARIN LEVEL (UNFRACTIONATED): Heparin Unfractionated: 0.19 IU/mL — ABNORMAL LOW (ref 0.30–0.70)

## 2019-12-15 LAB — GLUCOSE, CAPILLARY
Glucose-Capillary: 95 mg/dL (ref 70–99)
Glucose-Capillary: 138 mg/dL — ABNORMAL HIGH (ref 70–99)

## 2019-12-15 LAB — ABO/RH: ABO/RH(D): O NEG

## 2019-12-15 MED ORDER — ADULT MULTIVITAMIN W/MINERALS CH
1.0000 | ORAL_TABLET | Freq: Every day | ORAL | Status: DC
  Administered 2019-12-15 – 2019-12-21 (×7): 1 via ORAL
  Filled 2019-12-15 (×7): qty 1

## 2019-12-15 MED ORDER — ZINC SULFATE 220 (50 ZN) MG PO CAPS
220.0000 mg | ORAL_CAPSULE | Freq: Every day | ORAL | Status: DC
  Administered 2019-12-15 – 2019-12-21 (×7): 220 mg via ORAL
  Filled 2019-12-15 (×7): qty 1

## 2019-12-15 MED ORDER — ALBUTEROL SULFATE HFA 108 (90 BASE) MCG/ACT IN AERS
2.0000 | INHALATION_SPRAY | Freq: Three times a day (TID) | RESPIRATORY_TRACT | Status: DC
  Administered 2019-12-15: 2 via RESPIRATORY_TRACT

## 2019-12-15 MED ORDER — SODIUM CHLORIDE 0.9 % IV SOLN
100.0000 mg | INTRAVENOUS | Status: DC
  Filled 2019-12-15 (×2): qty 20

## 2019-12-15 MED ORDER — ASCORBIC ACID 500 MG PO TABS
500.0000 mg | ORAL_TABLET | Freq: Every day | ORAL | Status: DC
  Administered 2019-12-15 – 2019-12-21 (×7): 500 mg via ORAL
  Filled 2019-12-15 (×6): qty 1

## 2019-12-15 MED ORDER — SODIUM CHLORIDE 0.9 % IV SOLN: INTRAVENOUS | Status: DC

## 2019-12-15 MED ORDER — APIXABAN 2.5 MG PO TABS
2.5000 mg | ORAL_TABLET | Freq: Two times a day (BID) | ORAL | Status: DC
  Administered 2019-12-15 – 2019-12-21 (×13): 2.5 mg via ORAL
  Filled 2019-12-15 (×12): qty 1

## 2019-12-15 MED ORDER — ALBUTEROL SULFATE HFA 108 (90 BASE) MCG/ACT IN AERS
2.0000 | INHALATION_SPRAY | Freq: Three times a day (TID) | RESPIRATORY_TRACT | Status: DC
  Administered 2019-12-15: 2 via RESPIRATORY_TRACT
  Filled 2019-12-15: qty 6.7

## 2019-12-15 MED ORDER — SODIUM CHLORIDE 0.9 % IV SOLN
200.0000 mg | Freq: Once | INTRAVENOUS | Status: DC
  Filled 2019-12-15: qty 40

## 2019-12-15 MED ORDER — GUAIFENESIN-DM 100-10 MG/5ML PO SYRP: 10.0000 mL | ORAL_SOLUTION | ORAL | Status: DC | PRN

## 2019-12-15 MED ORDER — SODIUM CHLORIDE 0.9 % IV SOLN
100.0000 mg | Freq: Every day | INTRAVENOUS | Status: DC
  Filled 2019-12-15: qty 20

## 2019-12-15 MED ORDER — SODIUM CHLORIDE 0.9% FLUSH
3.0000 mL | Freq: Two times a day (BID) | INTRAVENOUS | Status: DC
  Administered 2019-12-15 – 2019-12-16 (×4): 3 mL via INTRAVENOUS

## 2019-12-15 MED ORDER — DEXAMETHASONE SODIUM PHOSPHATE 10 MG/ML IJ SOLN: 6.0000 mg | INTRAMUSCULAR | Status: DC

## 2019-12-15 MED ORDER — INSULIN ASPART 100 UNIT/ML ~~LOC~~ SOLN: 0.0000 [IU] | Freq: Every day | SUBCUTANEOUS | Status: DC

## 2019-12-15 MED ORDER — FOLIC ACID 1 MG PO TABS
1.0000 mg | ORAL_TABLET | Freq: Every day | ORAL | Status: DC
  Administered 2019-12-15 – 2019-12-21 (×7): 1 mg via ORAL
  Filled 2019-12-15 (×7): qty 1

## 2019-12-15 MED ORDER — INSULIN ASPART 100 UNIT/ML ~~LOC~~ SOLN: 0.0000 [IU] | Freq: Three times a day (TID) | SUBCUTANEOUS | Status: DC

## 2019-12-15 MED ORDER — SODIUM CHLORIDE 0.9 % IV SOLN: 100.0000 mg | Freq: Every day | INTRAVENOUS | Status: DC

## 2019-12-15 MED ORDER — HYDROCOD POLST-CPM POLST ER 10-8 MG/5ML PO SUER
5.0000 mL | Freq: Two times a day (BID) | ORAL | Status: DC | PRN
  Administered 2019-12-16 – 2019-12-17 (×2): 5 mL via ORAL
  Filled 2019-12-15 (×3): qty 5

## 2019-12-15 MED ORDER — PREDNISONE 20 MG PO TABS
50.0000 mg | ORAL_TABLET | Freq: Once | ORAL | Status: AC
  Administered 2019-12-15: 50 mg via ORAL
  Filled 2019-12-15: qty 1

## 2019-12-15 NOTE — Progress Notes (Addendum)
Progress Note  Patient Name: Anthony Andrews Date of Encounter: 12/15/2019  Primary Cardiologist: Quay Burow, MD   Subjective   SOB improving  Inpatient Medications    Scheduled Meds: . acetaminophen  650 mg Oral TID  . aspirin EC  81 mg Oral Q breakfast  . carvedilol  3.125 mg Oral BID WC  . cholecalciferol  1,000 Units Oral Daily  . doxycycline  100 mg Oral Q12H  . famotidine  10 mg Oral Daily  . guaiFENesin  600 mg Oral BID  . hydrALAZINE  37.5 mg Oral TID  . isosorbide mononitrate  15 mg Oral Daily  . loratadine  10 mg Oral Daily  . PARoxetine  20 mg Oral Daily  . rosuvastatin  10 mg Oral QPM  . sodium bicarbonate  650 mg Oral BID  . sodium chloride flush  3 mL Intravenous Q12H   Continuous Infusions: . sodium chloride    . cefTRIAXone (ROCEPHIN)  IV 1 g (12/14/19 1335)  . heparin 900 Units/hr (12/15/19 0624)   PRN Meds: sodium chloride, acetaminophen **OR** acetaminophen, alum & mag hydroxide-simeth, ipratropium-albuterol, ondansetron **OR** ondansetron (ZOFRAN) IV, polyethylene glycol, sodium chloride flush, traZODone   Vital Signs    Vitals:   12/14/19 1630 12/14/19 2113 12/15/19 0512 12/15/19 0706  BP: 135/82 (!) 148/81 135/70   Pulse:  73 84   Resp:  16 16   Temp:  (!) 97.5 F (36.4 C) 98 F (36.7 C)   TempSrc:  Oral Oral   SpO2:  98% 97%   Weight:    55.8 kg  Height:        Intake/Output Summary (Last 24 hours) at 12/15/2019 0812 Last data filed at 12/15/2019 0500 Gross per 24 hour  Intake 450.32 ml  Output 1925 ml  Net -1474.68 ml   Last 3 Weights 12/15/2019 12/14/2019 12/13/2019  Weight (lbs) 123 lb 0.3 oz 119 lb 0.8 oz 119 lb 11.4 oz  Weight (kg) 55.8 kg 54 kg 54.3 kg      Telemetry    n/a - Personally Reviewed  ECG    n/a - Personally Reviewed  Physical Exam   GEN: No acute distress.   Neck: No JVD Cardiac: RRR, no murmurs, rubs, or gallops.  Respiratory: Clear to auscultation bilaterally. GI: Soft, nontender, non-distended   MS: No edema; No deformity. Neuro:  Nonfocal  Psych: Normal affect   Labs    High Sensitivity Troponin:   Recent Labs  Lab 12/09/19 1115 12/09/19 1442 12/10/19 0613  TROPONINIHS 4,269* 4,848* 3,050*      Chemistry Recent Labs  Lab 12/09/19 1115 12/10/19 4098 12/12/19 0404 12/13/19 0412 12/14/19 0445  NA 133*   < > 135  134* 132* 133*  K 4.1   < > 4.8  4.7 4.4 4.1  CL 104   < > 104  104 104 105  CO2 20*   < > 21*  21* 22 20*  GLUCOSE 107*   < > 78  77 85 86  BUN 32*   < > 42*  43* 36* 35*  CREATININE 2.05*   < > 2.44*  2.43* 2.26* 2.32*  CALCIUM 9.6   < > 9.3  9.2 9.0 9.1  PROT 6.6  --   --   --   --   ALBUMIN 2.9*  --  2.6*  --  2.6*  AST 31  --   --   --   --   ALT 13  --   --   --   --  ALKPHOS 76  --   --   --   --   BILITOT 0.4  --   --   --   --   GFRNONAA 32*   < > 26*  26* 28* 27*  GFRAA 37*   < > 30*  30* 33* 32*  ANIONGAP 9   < > 10  9 6 8    < > = values in this interval not displayed.     Hematology Recent Labs  Lab 12/13/19 0412 12/14/19 0445 12/15/19 0437  WBC 7.5 7.0 6.7  RBC 2.74* 2.70* 2.76*  HGB 8.4* 8.9* 8.8*  HCT 26.7* 26.4* 27.3*  MCV 97.4 97.8 98.9  MCH 30.7 33.0 31.9  MCHC 31.5 33.7 32.2  RDW 14.6 14.3 14.3  PLT 416* 442* 376    BNP Recent Labs  Lab 12/09/19 1419 12/11/19 0609  BNP 3,459.0* 1,010.0*     DDimer No results for input(s): DDIMER in the last 168 hours.   Radiology    No results found.  Cardiac Studies    Patient Profile     74 y.o. male 74 y.o. male with past medical history of HTN, HLD, carotid artery stenosis, paroxysmal atrial fibrillation (diagnosed in 08/2019 with episodes of secondary AV block during admission), cardiomyopathy (EF 45-50% by echo in 08/2019), chronic anemia and prior CVAwho is being seen  for the evaluation of NSTEMIat the request ofDr. Emokpae.  Assessment & Plan    1. 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 - baseline weight 119- 125 lbs, admitted 132 lbs and BNP 3459 and CXR consistent with edema - standing weight down to 119 lbs this admission. Uptrend in Cr, diuretics held last few days. Actually gave some IVFs yesterday. No labs back today  - medical therapy with coreg 3.125mg  bid, hydral 37.5mg  tid, imdur 15. No ACE/ARB/ARNI/aldactone due to poor renal function    2. Elevated troponin - peak trop 4800 trending down. - echo with relative decrease in LVE to 40% - EKG nonspecific ST/T changes - no specific chest pains  - have not pursued cath due to poor renal function - current medical therapy with coreg, ASA, rosuvastatin, hep gtt  - if improved with todays labs would consider cath, if not would only treat medically.   3. PAF - rate controlled, has been in SR - eliquis on hold while on hep gtt-would restart Eliquis but will need to be on 2.5 mg bid with weight < 60 kg and Crt > 1.5.  4. AKI on CKD - baseline creatinine 1.4 - 1.7. At 2.05 on admission, at 2.32yeseterday with labs pending today   5. Pneumonia - abx per primary team  6. Anemia -chronic, fluctuations this admission without a clear downtrend.      Addendum Cr down to 1.9 with IVFs consistent with being overdiuresed, his presentation was complicated as he had pneumonia by CXR without other clear signs, and thus it was unclear if his ongoing SOB was pneumonia or HF related and volume status data remained mixed.  I think reasonable to plan for RHC and LHC tomorrow. Discussed with primary team who will arrange the transfer to medicien team at [facility]. Gentle IVFs again today.    [place] COVID test neg on admission, repeat today was POSITIVE. We will cancel his RHC/LHC, no need to transfer to [facility]. We will stop heparin gtt and restart his  eliquis. Cath is not urgent and can consider as outpatient.      For questions or updates, please  contact [facility] Please consult [email or web address] for contact info under        Signed, Carlyle Dolly, MD  12/15/2019, 8:12 AM

## 2019-12-15 NOTE — Progress Notes (Signed)
ANTICOAGULATION CONSULT NOTE   Pharmacy Consult for heparin  Indication: chest pain/ACS/possible NSTEMI  Assessment: Pt with multiple medical hx including CVA who was admitted for CP. He has been on apixaban for his CVA/afib. It was last taken 3/17 at 0800. IV heparin has been ordered to r/o MI.  HL 0.19 units/ml  Goal of Therapy:  Heparin level 0.3-0.7 units/ml  Monitor platelets by anticoagulation protocol: Yes   Plan:  Increase heparin infusion to 900 units/hr  Check heparin level in ~ 6 hours Monitor daily heparin level Monitor for s/s of bleeding.  Thanks for allowing pharmacy to be a part of this patient's care.  Excell Seltzer, PharmD Clinical Pharmacist 12/15/2019 6:19 AM

## 2019-12-15 NOTE — Care Management (Signed)
RE:   EAN GETTEL      Date of Birth:  1945/11/11   Date:  12-15-2019      To Whom It May Concern:  Please be advised that the above-named patient will require a short-term nursing home stay - anticipated 30 days or less for rehabilitation and strengthening.  The plan is for return back to long term residency at [facility] in [place] [address].

## 2019-12-15 NOTE — Plan of Care (Signed)
  Problem: Education: Goal: Knowledge of General Education information will improve Description: Including pain rating scale, medication(s)/side effects and non-pharmacologic comfort measures Outcome: Progressing   Problem: Clinical Measurements: Goal: Diagnostic test results will improve Outcome: Progressing   Problem: Clinical Measurements: Goal: Respiratory complications will improve Outcome: Progressing   Problem: Clinical Measurements: Goal: Cardiovascular complication will be avoided Outcome: Progressing   Problem: Activity: Goal: Risk for activity intolerance will decrease Outcome: Progressing   Problem: Nutrition: Goal: Adequate nutrition will be maintained Outcome: Progressing   Problem: Elimination: Goal: Will not experience complications related to bowel motility Outcome: Progressing   Problem: Pain Managment: Goal: General experience of comfort will improve Outcome: Progressing   Problem: Skin Integrity: Goal: Risk for impaired skin integrity will decrease Outcome: Progressing

## 2019-12-15 NOTE — Progress Notes (Signed)
[facility] for heparin >>apixaban Indication: afib  No Known Allergies  Patient Measurements: Height: 5\' 3"  (160 cm) Weight: 123 lb 0.3 oz (55.8 kg) IBW/kg (Calculated) : 56.9 Heparin Dosing Weight: 57 kg  Vital Signs: Temp: 98 F (36.7 C) (03/23 0512) Temp Source: Oral (03/23 0512) BP: 150/77 (03/23 0938) Pulse Rate: 83 (03/23 0938)  Labs: Recent Labs    12/13/19 0412 12/13/19 0412 12/14/19 0445 12/14/19 1621 12/15/19 0437 12/15/19 0832  HGB 8.4*   < > 8.9*  --  8.8*  --   HCT 26.7*  --  26.4*  --  27.3*  --   PLT 416*  --  442*  --  376  --   APTT 93*  --  108* 55*  --   --   HEPARINUNFRC 0.92*   < > 0.66 0.41 0.19*  --   CREATININE 2.26*  --  2.32*  --   --  1.97*   < > = values in this interval not displayed.    Estimated Creatinine Clearance: 27.1 mL/min (A) (by C-G formula based on SCr of 1.97 mg/dL (H)).   Assessment: Pt with multiple medical hx including CVA who was admitted for CP. He has been on apixaban for his CVA/afib. It was last taken 3/17 at 0800. Plan was for cath at [facility] but patient is now COVID (+) so cath has been canceled - restart home Eliquis at reduced dose due to renal function/weight.   Goal of Therapy:  Monitor platelets by anticoagulation protocol: Yes   Plan:  Stop heparin infusion. Start apxiaban 2.5 mg twice daily.  Monitor labs and s/s of bleeding.  Margot Ables, PharmD Clinical Pharmacist 12/15/2019 1:23 PM

## 2019-12-15 NOTE — Care Management (Addendum)
Patient's covid test now positive. No longer transferring to [facility] for cath. Discussed with Jackelyn Poling of [facility] when patient can return.   ADDENDUM: Due to positive covid result, patient will need a covid SNF bed. Referral sent. PASSR started.

## 2019-12-15 NOTE — Progress Notes (Addendum)
.                                                                                   Patient Demographics:    Anthony Andrews, is a 74 y.o. male, DOB - Dec 19, 1945, PFX:[id number]  Admit date - 12/09/2019   Admitting Physician Nailani Full Denton Brick, MD  Outpatient Primary MD for the patient is [name], MD  LOS - 6   Chief Complaint  Patient presents with  . Shortness of Breath        Subjective:    Anthony Andrews has No fevers -shob persist Cough is better No cp  -As per patient and son--- patient tested positive for Covid in January 2021 shortly after he got his first Covid vaccine, patient initially got 1 dose of Covid vaccine in December 2020, patient got another dose of Covid vaccine in February 2021. -On admission on 12/09/2019 Covid test was negative -Repeat Covid test on 12/14/2019 is positive--- suspect this is asymptomatic Covid   Assessment  & Plan :    Principal Problem:   Abnormal result of cardiovascular function study suggestive of non-ST elevation myocardial infarction (NSTEMI) Active Problems:   Acute respiratory failure with hypoxia (HCC)   Acute on chronic HFrEF (heart failure with reduced ejection fraction) (HCC)   AKI (acute kidney injury) on CKD IIIa   Atrial fibrillation (HCC)   Pneumonia   Non-ST elevation (NSTEMI) myocardial infarction ([place])   Tobacco abuse   Acute on chronic combined systolic and diastolic CHF (congestive heart failure) (HCC)   PAF (paroxysmal atrial fibrillation) (HCC)   Essential hypertension   Bilateral carotid artery stenosis   Anemia   Stage 3 chronic kidney disease   Brief Summary 74 y.o. male with PMHx of Prior CVA with mild residual left-sided weakness, HTN, ongoing tobacco abuse, PAF on chronic anticoagulation with Eliquis, chronic anemia, recurrent falls syncope and status post 30-day event monitor placed on 09/16/2020 admitted on 12/09/2019 with concerns about possible NSTEMI and CHF exacerbation as well as possible superimposed  right-sided pneumonia - -As per patient and son--- patient tested positive for Covid in January 2021 shortly after he got his first Covid vaccine, patient initially got 1 dose of Covid vaccine in December 2020, patient got another dose of Covid vaccine in February 2021. -On admission on 12/09/2019 Covid test was negative -Repeat Covid test on 12/14/2019 is positive--- suspect this is asymptomatic Covid    A/p  1)Possible NSTEMI--- ???  Due to demand ischemia in the setting of CHF exacerbation --- on admission patient presented  with dyspnea, EKG with ST depression nonspecific ST and T wave changes -No chest pain, dyspnea at rest mostly resolved,, dyspnea on exertion has not resolved - echo with EF down to 40% from 45 to 50% less than 3 months ago on 09/17/2019, echo today also shows global hypokinesis and grade 2 diastolic dysfunction, pulmonary hypertension -Troponin 4,269 >>  4,848 >>> 3,050 - Stop IV heparin started on 12/09/2019---cardiology service says ok to  restart Eliquis as no LHC/RHC is planned due to renal concerns and Covid positive status -Continue Coreg, Crestor and aspirin -Cardiology consult appreciated, patient may need RHC/LHC when renal function permits(cardiology  service would like to do gentle hydration to see if renal function will improve with IV fluids)  2)HFrEF---  patient admitted with acute on chronic combined systolic and diastolic CHF exacerbation  --- Admission chest x-ray with possible pulmonary edema and pleural fluid -Repeat chest x-ray on 12/11/2019 continue to suggest CHF superimposed on emphysema and possible right-sided pneumonia -Repeat echo on 12/09/2018 as noted above #1 -Repeat echo on 12/11/2019 suggest EF of 40% without evidence of significant volume overload -BNP  3459 >> 1010 -Elevated troponins as above --Standing weight 119,  baseline weight 125  -???  Slightly over diuresed, IV diuretics discontinued -Gentle IV fluids per cardiology service to  see if renal function improves enough to allow for LHC ACE/ARB therapy was not prescribed due to Worsening renal function. -Monitor daily weights and fluid input and output accurately  3)AKI----acute kidney injury on CKD stage - IIIa-worsening renal function might be due to CHF exacerbation with decreased renal perfusion   creatinine on admission= 2.0 , baseline creatinine = 1.4 (09/21/2019)    , creatinine is now down to 1.97 (peak this admission was 2.55) most likely due to diuresis, -- renally adjust medications, avoid nephrotoxic agents / dehydration  / hypotension --Continue sodium bicarb -Monitor renal function closely as #2 above -Holding further diuretics for now   4)Chronic normocytic and normochromic Anemia---  hemoglobin currently stable at 8.8 -No evidence of ongoing bleeding, PTA  the patient was on Eliquis, -restart Eliquis -Stool occult blood Negative  5)History of PAFib/history of second-degree AV block/history of tachyarrhythmia or bradyarrhythmia--- patient had a 30-day event monitor placed on 11/18/2019 -Preliminary review of his 30-day event monitor shows no significant arrhythmias -Continue Coreg, stop IV heparin for now -Restart Eliquis  6) history of COVID-19 infection--- resolved, asymptomatic, repeat Covid test negative on admission on 12/09/2019 -Repeat Covid test prior to SNF placement on 12/14/2019 positive  --As per patient and son--- patient tested positive for Covid in January 2021 shortly after he got his first Covid vaccine, patient initially got 1 dose of Covid vaccine in December 2020, patient got another dose of Covid vaccine in February 2021. -On admission on 12/09/2019 Covid test was negative -Repeat Covid test on 12/14/2019 is positive--- suspect this is asymptomatic Covid  -Trend inflammatory markers -Hold off on remdesivir and steroids  7)History of prior stroke with residual left-sided hemiparesis----anticoagulation as above #1 and #5 -Continue  statin and aspirin  8) possible Rt Sided pneumonia-----no fevers, no leukocytosis, shortness of breath is improving, , admission chest x-ray findings with pneumonia versus pulmonary edema---  -Repeat chest x-ray on 12/11/2019 continues to suggest CHF superimposed on emphysema with possible concomitant right-sided pneumonia -Repeat chest x-ray on 12/15/2019 with findings suggestive of atypical pneumonia or nonspecific scarring --Continue Rocephin and doxycycline started on 12/09/2019 and as well as mucolytics and bronchodilators    9)Tobacco abuse--- smoking cessation strongly advised, patient is not ready to quit  10) acute hypoxic respiratory failure--- secondary to #1,#2 and #8 above, continue supplemental oxygen, treat as above #1 and #2  11)Social/Ethics--- patient is a DNR/DNI, he is from [facility] where he has lived for over 6 months now  12)Generalized Weakness/deconditioning--PT eval appreciated recommends SNF rehab  Disposition/Need for in-Hospital Stay- patient unable to be discharged at this time due to --- -CHF and pneumonia improving awaiting transfer to SNF rehab that will accept Covid positive patients -Cardiac cath canceled due to positive Covid test  Code Status : DNR  Family Communication:    (patient is  alert, awake and coherent) --- Plan of care discussed with patient's son  Consults  :  cardiology  DVT Prophylaxis  : Eliquis  Lab Results  Component Value Date   PLT 376 12/15/2019    Inpatient Medications  Scheduled Meds: . acetaminophen  650 mg Oral TID  . albuterol  2 puff Inhalation TID  . vitamin C  500 mg Oral Daily  . aspirin EC  81 mg Oral Q breakfast  . carvedilol  3.125 mg Oral BID WC  . cholecalciferol  1,000 Units Oral Daily  . dexamethasone (DECADRON) injection  6 mg Intravenous Q24H  . doxycycline  100 mg Oral Q12H  . famotidine  10 mg Oral Daily  . folic acid  1 mg Oral Daily  . guaiFENesin  600 mg Oral BID  . hydrALAZINE  37.5 mg  Oral TID  . insulin aspart  0-5 Units Subcutaneous QHS  . insulin aspart  0-9 Units Subcutaneous TID WC  . isosorbide mononitrate  15 mg Oral Daily  . loratadine  10 mg Oral Daily  . multivitamin with minerals  1 tablet Oral Daily  . PARoxetine  20 mg Oral Daily  . rosuvastatin  10 mg Oral QPM  . sodium bicarbonate  650 mg Oral BID  . sodium chloride flush  3 mL Intravenous Q12H  . sodium chloride flush  3 mL Intravenous Q12H  . zinc sulfate  220 mg Oral Daily   Continuous Infusions: . sodium chloride    . sodium chloride    . cefTRIAXone (ROCEPHIN)  IV 1 g (12/14/19 1335)  . heparin 900 Units/hr (12/15/19 0624)  . remdesivir 200 mg in sodium chloride 0.9% 250 mL IVPB     Followed by  . [START ON 12/16/2019] remdesivir 100 mg in NS 100 mL     PRN Meds:.sodium chloride, acetaminophen **OR** acetaminophen, alum & mag hydroxide-simeth, chlorpheniramine-HYDROcodone, guaiFENesin-dextromethorphan, ipratropium-albuterol, ondansetron **OR** ondansetron (ZOFRAN) IV, polyethylene glycol, sodium chloride flush, traZODone    Anti-infectives (From admission, onward)   Start     Dose/Rate Route Frequency Ordered Stop   12/16/19 1000  remdesivir 100 mg in sodium chloride 0.9 % 100 mL IVPB     100 mg 200 mL/hr over 30 Minutes Intravenous Daily 12/15/19 1316 12/20/19 0959   12/15/19 1315  remdesivir 200 mg in sodium chloride 0.9% 250 mL IVPB     200 mg 580 mL/hr over 30 Minutes Intravenous Once 12/15/19 1316     12/10/19 1400  cefTRIAXone (ROCEPHIN) 1 g in sodium chloride 0.9 % 100 mL IVPB     1 g 200 mL/hr over 30 Minutes Intravenous Every 24 hours 12/09/19 1741     12/09/19 2200  doxycycline (VIBRA-TABS) tablet 100 mg     100 mg Oral Every 12 hours 12/09/19 1741     12/09/19 1315  cefTRIAXone (ROCEPHIN) 1 g in sodium chloride 0.9 % 100 mL IVPB     1 g 200 mL/hr over 30 Minutes Intravenous  Once 12/09/19 1310 12/09/19 1445   12/09/19 1315  azithromycin (ZITHROMAX) 500 mg in sodium chloride  0.9 % 250 mL IVPB     500 mg 250 mL/hr over 60 Minutes Intravenous  Once 12/09/19 1310 12/09/19 1554        Objective:   Vitals:   12/15/19 0512 12/15/19 0706 12/15/19 0924 12/15/19 0938  BP: 135/70   (!) 150/77  Pulse: 84   83  Resp: 16     Temp: 98 F (36.7 C)  TempSrc: Oral     SpO2: 97%  97%   Weight:  55.8 kg    Height:        Wt Readings from Last 3 Encounters:  12/15/19 55.8 kg  11/17/19 57.1 kg  10/01/19 85.3 kg     Intake/Output Summary (Last 24 hours) at 12/15/2019 1317 Last data filed at 12/15/2019 0940 Gross per 24 hour  Intake 210.32 ml  Output 1925 ml  Net -1714.68 ml   Physical Exam  Gen:- Awake Alert,  In no apparent distress  HEENT:- [place].AT, No sclera icterus Neck-Supple Neck,No JVD,.  Lungs-improved air movement, no rales CV- S1, S2 normal, regular , anterior chest wall with cardiac event monitor  abd-  +ve B.Sounds, Abd Soft, No tenderness,    Extremity/Skin:- No edema, pedal pulses present  Psych-affect is appropriate, oriented x3 Neuro-generalized weakness, no new focal deficits, no tremors .    Data Review:   Micro Results Recent Results (from the past 240 hour(s))  Respiratory Panel by RT PCR (Flu A&B, Covid) - Nasopharyngeal Swab     Status: None   Collection Time: 12/09/19  2:21 PM   Specimen: Nasopharyngeal Swab  Result Value Ref Range Status   SARS Coronavirus 2 by RT PCR NEGATIVE NEGATIVE Final    Comment: (NOTE) SARS-CoV-2 target nucleic acids are NOT DETECTED. The SARS-CoV-2 RNA is generally detectable in upper respiratoy specimens during the acute phase of infection. The lowest concentration of SARS-CoV-2 viral copies this assay can detect is 131 copies/mL. A negative result does not preclude SARS-Cov-2 infection and should not be used as the sole basis for treatment or other patient management decisions. A negative result may occur with  improper specimen collection/handling, submission of specimen other than  nasopharyngeal swab, presence of viral mutation(s) within the areas targeted by this assay, and inadequate number of viral copies (<131 copies/mL). A negative result must be combined with clinical observations, patient history, and epidemiological information. The expected result is Negative. Fact Sheet for Patients:  PinkCheek.be Fact Sheet for Healthcare Providers:  GravelBags.it This test is not yet ap proved or cleared by the [place] FDA and  has been authorized for detection and/or diagnosis of SARS-CoV-2 by FDA under an Emergency Use Authorization (EUA). This EUA will remain  in effect (meaning this test can be used) for the duration of the COVID-19 declaration under Section 564(b)(1) of the Act, 21 U.S.C. section 360bbb-3(b)(1), unless the authorization is terminated or revoked sooner.    Influenza A by PCR NEGATIVE NEGATIVE Final   Influenza B by PCR NEGATIVE NEGATIVE Final    Comment: (NOTE) The Xpert Xpress SARS-CoV-2/FLU/RSV assay is intended as an aid in  the diagnosis of influenza from Nasopharyngeal swab specimens and  should not be used as a sole basis for treatment. Nasal washings and  aspirates are unacceptable for Xpert Xpress SARS-CoV-2/FLU/RSV  testing. Fact Sheet for Patients: PinkCheek.be Fact Sheet for Healthcare Providers: GravelBags.it This test is not yet approved or cleared by the [place] FDA and  has been authorized for detection and/or diagnosis of SARS-CoV-2 by  FDA under an Emergency Use Authorization (EUA). This EUA will remain  in effect (meaning this test can be used) for the duration of the  Covid-19 declaration under Section 564(b)(1) of the Act, 21  U.S.C. section 360bbb-3(b)(1), unless the authorization is  terminated or revoked. Performed at [facility], [address] [place], [place] [address]   Respiratory Panel  by RT PCR (Flu A&B, Covid) - Nasopharyngeal  Swab     Status: Abnormal   Collection Time: 12/15/19 11:15 AM   Specimen: Nasopharyngeal Swab  Result Value Ref Range Status   SARS Coronavirus 2 by RT PCR POSITIVE (A) NEGATIVE Final    Comment: RESULT CALLED TO, READ BACK BY AND VERIFIED WITH: BARKER B. AT 1254 ON [id number] BY THOMPSON S. (NOTE) SARS-CoV-2 target nucleic acids are DETECTED. SARS-CoV-2 RNA is generally detectable in upper respiratory specimens  during the acute phase of infection. Positive results are indicative of the presence of the identified virus, but do not rule out bacterial infection or co-infection with other pathogens not detected by the test. Clinical correlation with patient history and other diagnostic information is necessary to determine patient infection status. The expected result is Negative. Fact Sheet for Patients:  PinkCheek.be Fact Sheet for Healthcare Providers: GravelBags.it This test is not yet approved or cleared by the [place] FDA and  has been authorized for detection and/or diagnosis of SARS-CoV-2 by FDA under an Emergency Use Authorization (EUA).  This EUA will remain in effect (meaning this test can be  used) for the duration of  the COVID-19 declaration under Section 564(b)(1) of the Act, 21 U.S.C. section 360bbb-3(b)(1), unless the authorization is terminated or revoked sooner.    Influenza A by PCR NEGATIVE NEGATIVE Final   Influenza B by PCR NEGATIVE NEGATIVE Final    Comment: (NOTE) The Xpert Xpress SARS-CoV-2/FLU/RSV assay is intended as an aid in  the diagnosis of influenza from Nasopharyngeal swab specimens and  should not be used as a sole basis for treatment. Nasal washings and  aspirates are unacceptable for Xpert Xpress SARS-CoV-2/FLU/RSV  testing. Fact Sheet for Patients: PinkCheek.be Fact Sheet for Healthcare  Providers: GravelBags.it This test is not yet approved or cleared by the [place] FDA and  has been authorized for detection and/or diagnosis of SARS-CoV-2 by  FDA under an Emergency Use Authorization (EUA). This EUA will remain  in effect (meaning this test can be used) for the duration of the  Covid-19 declaration under Section 564(b)(1) of the Act, 21  U.S.C. section 360bbb-3(b)(1), unless the authorization is  terminated or revoked. Performed at [facility], [address], [place], [place] [address]     Radiology Reports DG Chest 2 View  Result Date: 12/15/2019 CLINICAL DATA:  Dyspnea, weakness, history of COVID-19 EXAM: CHEST - 2 VIEW COMPARISON:  12/11/2019 chest radiograph. FINDINGS: Stable metallic device in the ventral medial right chest wall. Fixation screw overlies the left glenoid. Stable cardiomediastinal silhouette with normal heart size. No pneumothorax. Small right pleural effusion is decreased. Resolved left pleural effusion. Patchy hazy and reticular opacities in both lungs appear improved with significantly improved aeration at the lung bases. IMPRESSION: 1. Significantly improved aeration at the lung bases with persistent mild patchy hazy and reticular opacities in both lungs, which could represent atypical pneumonia or nonspecific scarring. 2. Small right pleural effusion, decreased. Resolved left pleural effusion. Electronically Signed   By: Ilona Sorrel M.D.   On: 12/15/2019 10:53   DG Chest 2 View  Result Date: 12/11/2019 CLINICAL DATA:  Shortness of breath with atrial fibrillation and hypertension EXAM: CHEST - 2 VIEW COMPARISON:  December 09, 2019 chest radiograph and chest CT September 15, 2020 FINDINGS: Underlying emphysematous change is noted, most severe in the right upper lobe. There is a right pleural effusion with airspace opacity in the right lower lobe. There is atelectatic change in the left base. Heart is mildly enlarged. There is  mild  interstitial edema. Heart is slightly enlarged with pulmonary vascularity reflecting the underlying emphysema. There is aortic atherosclerosis. There is a loop recorder anteriorly. There is postoperative change in the left medial shoulder region. IMPRESSION: Findings most consistent with congestive heart failure superimposed on emphysematous change. Cardiomegaly is present with interstitial edema. Right pleural effusion. There is suspected superimposed right lower lobe pneumonia. There is aortic atherosclerosis. Aortic Atherosclerosis (ICD10-I70.0) and Emphysema (ICD10-J43.9). Electronically Signed   By: Lowella Grip III M.D.   On: 12/11/2019 11:14   DG Chest Portable 1 View  Result Date: 12/09/2019 CLINICAL DATA:  EMS reports pt is a residen at [facility]. Reports sob and cp since last night. Per Pts chart Pt has had COVID - unsure when. Hx A-fib, stroke, smoker EXAM: PORTABLE CHEST 1 VIEW COMPARISON:  09/15/2019 FINDINGS: Lungs density interstitial hazy airspace lung opacities, with more confluent opacity in the right lung base, obscuring the hemidiaphragm. Cardiac silhouette is normal in size.  Mediastinal hilar masses. Probable pleural effusions, larger on the right.  No pneumothorax. IMPRESSION: 1. Interstitial and hazy airspace lung opacities bilaterally with more confluent opacity noted in the right lung base. Findings may reflect multifocal pneumonia or pulmonary edema. Suspect right lung base opacity due to a combination of pleural fluid with either atelectasis or more confluent infection. Electronically Signed   By: Lajean Manes M.D.   On: 12/09/2019 11:34   ECHOCARDIOGRAM COMPLETE  Result Date: 12/09/2019    ECHOCARDIOGRAM REPORT   Patient Name:   COURTLAND REAS Date of Exam: 12/09/2019 Medical Rec #:  [id number]   Height:       63.0 in Accession #:    [id number]  Weight:       125.0 lb Date of Birth:  05-20-1946  BSA:          1.584 m Patient Age:    33 years    BP:           140/78 mmHg  Patient Gender: M           HR:           90 bpm. Exam Location:  [facility] Procedure: 2D Echo Indications:    Dyspnea 786.09 / R06.00  History:        Patient has prior history of Echocardiogram examinations, most                 recent 09/17/2019. Stroke, Arrythmias:Atrial Fibrillation; Risk                 Factors:Current Smoker. Rhabdomyolysis, Pneumonia.  Sonographer:    Leavy Cella RDCS (AE) Referring Phys: [id number] Tenessa Marsee IMPRESSIONS  1. Left ventricular ejection fraction, by estimation, is 40%. The left ventricle has moderately decreased function. The left ventricle demonstrates global hypokinesis. Left ventricular diastolic parameters are consistent with Grade II diastolic dysfunction (pseudonormalization).  2. Right ventricular systolic function is normal. The right ventricular size is normal. There is moderately elevated pulmonary artery systolic pressure.  3. The mitral valve is grossly normal. Mild mitral valve regurgitation.  4. Morphologically, there appears to be mild aortic stenosis. The aortic valve is tricuspid. Aortic valve regurgitation is not visualized.  5. The inferior vena cava is normal in size with greater than 50% respiratory variability, suggesting right atrial pressure of 3 mmHg. FINDINGS  Left Ventricle: Left ventricular ejection fraction, by estimation, is 40%. The left ventricle has moderately decreased function. The left ventricle demonstrates global hypokinesis. The left ventricular internal cavity size was normal  in size. There is no left ventricular hypertrophy. Left ventricular diastolic parameters are consistent with Grade II diastolic dysfunction (pseudonormalization). Indeterminate filling pressures. Right Ventricle: The right ventricular size is normal. No increase in right ventricular wall thickness. Right ventricular systolic function is normal. There is moderately elevated pulmonary artery systolic pressure. The tricuspid regurgitant velocity is 3.08 m/s, and  with an assumed right atrial pressure of 10 mmHg, the estimated right ventricular systolic pressure is 01.6 mmHg. Left Atrium: Left atrial size was normal in size. Right Atrium: Right atrial size was normal in size. Pericardium: There is no evidence of pericardial effusion. Mitral Valve: The mitral valve is grossly normal. Mild mitral valve regurgitation. Tricuspid Valve: The tricuspid valve is grossly normal. Tricuspid valve regurgitation is mild. Aortic Valve: Morphologically, there appears to be mild aortic stenosis. The aortic valve is tricuspid. . There is mild thickening and mild calcification of the aortic valve. Aortic valve regurgitation is not visualized. Mild aortic valve annular calcification. There is mild thickening of the aortic valve. There is mild calcification of the aortic valve. Pulmonic Valve: The pulmonic valve was grossly normal. Pulmonic valve regurgitation is not visualized. Aorta: The aortic root is normal in size and structure. Venous: The inferior vena cava is normal in size with greater than 50% respiratory variability, suggesting right atrial pressure of 3 mmHg. IAS/Shunts: No atrial level shunt detected by color flow Doppler.  LEFT VENTRICLE PLAX 2D LVIDd:         4.76 cm  Diastology LVIDs:         4.10 cm  LV e' lateral:   6.96 cm/s LV PW:         0.84 cm  LV E/e' lateral: 9.4 LV IVS:        0.79 cm  LV e' medial:    5.98 cm/s LVOT diam:     2.10 cm  LV E/e' medial:  11.0 LVOT Area:     3.46 cm  RIGHT VENTRICLE RV S prime:     11.60 cm/s TAPSE (M-mode): 1.7 cm LEFT ATRIUM             Index       RIGHT ATRIUM           Index LA diam:        4.00 cm 2.53 cm/m  RA Area:     13.90 cm LA Vol (A2C):   74.9 ml 47.30 ml/m RA Volume:   37.30 ml  23.55 ml/m LA Vol (A4C):   42.6 ml 26.90 ml/m LA Biplane Vol: 56.1 ml 35.43 ml/m   AORTA Ao Root diam: 2.80 cm MITRAL VALVE               TRICUSPID VALVE MV Area (PHT): 2.91 cm    TR Peak grad:   37.9 mmHg MV Decel Time: 261 msec    TR Vmax:         308.00 cm/s MR Peak grad: 42.2 mmHg MR Mean grad: 29.0 mmHg    SHUNTS MR Vmax:      325.00 cm/s  Systemic Diam: 2.10 cm MR Vmean:     254.0 cm/s MV E velocity: 65.50 cm/s MV A velocity: 41.70 cm/s MV E/A ratio:  1.57 Kate Sable MD Electronically signed by Kate Sable MD Signature Date/Time: 12/09/2019/3:20:59 PM    Final    ECHOCARDIOGRAM LIMITED  Result Date: 12/11/2019    ECHOCARDIOGRAM LIMITED REPORT   Patient Name:   ABDULAZIZ TOMAN Date of Exam: 12/11/2019 Medical Rec #:  [id number]   Height:       63.0 in Accession #:    [id number]  Weight:       119.6 lb Date of Birth:  06-May-1946  BSA:          1.554 m Patient Age:    74 years    BP:           114/81 mmHg Patient Gender: M           HR:           73 bpm. Exam Location:  [facility] Procedure: Limited Echo Indications:    Dyspnea 786.09 / R06.00  History:        Patient has prior history of Echocardiogram examinations, most                 recent 12/09/2019. Previous Myocardial Infarction,                 Arrythmias:Atrial Fibrillation; Risk Factors:Current Smoker and                 Hypertension. Rhabdomyolysis.  Sonographer:    Leavy Cella RDCS (AE) Referring Phys: [id number] [place]  1. Left ventricular ejection fraction, by estimation, is 40%. The left ventricle demonstrates global hypokinesis.  2. The inferior vena cava is normal in size with greater than 50% respiratory variability, suggesting right atrial pressure of 3 mmHg.  3. Limited echo to evaluate LV function. Echocontrast was used to enhance visualization. FINDINGS  Left Ventricle: Left ventricular ejection fraction, by estimation, is 40%. The left ventricle demonstrates global hypokinesis. Definity contrast agent was given IV to delineate the left ventricular endocardial borders. Pericardium: Trivial pericardial effusion is present. The pericardial effusion is circumferential. Venous: The inferior vena cava is normal in size with greater than 50% respiratory  variability, suggesting right atrial pressure of 3 mmHg. Carlyle Dolly MD Electronically signed by Carlyle Dolly MD Signature Date/Time: 12/11/2019/3:12:49 PM    Final      CBC Recent Labs  Lab 12/09/19 1115 12/10/19 2725 12/11/19 3664 12/12/19 0404 12/13/19 0412 12/14/19 0445 12/15/19 0437  WBC 7.9   < > 7.9 8.2 7.5 7.0 6.7  HGB 8.8*   < > 7.9* 7.8* 8.4* 8.9* 8.8*  HCT 27.6*   < > 24.5* 24.7* 26.7* 26.4* 27.3*  PLT 456*   < > 408* 425* 416* 442* 376  MCV 98.9   < > 97.2 97.6 97.4 97.8 98.9  MCH 31.5   < > 31.3 30.8 30.7 33.0 31.9  MCHC 31.9   < > 32.2 31.6 31.5 33.7 32.2  RDW 14.8   < > 14.6 14.4 14.6 14.3 14.3  LYMPHSABS 1.6  --   --   --   --   --   --   MONOABS 0.6  --   --   --   --   --   --   EOSABS 0.1  --   --   --   --   --   --   BASOSABS 0.1  --   --   --   --   --   --    < > = values in this interval not displayed.    Chemistries  Recent Labs  Lab 12/09/19 1115 12/10/19 4034 12/11/19 0849 12/12/19 0404 12/13/19 0412 12/14/19 0445 12/15/19 0832  NA 133*   < > 134* 135  134* 132* 133* 132*  K 4.1   < > 3.3*  4.8  4.7 4.4 4.1 3.9  CL 104   < > 102 104  104 104 105 106  CO2 20*   < > 22 21*  21* 22 20* 20*  GLUCOSE 107*   < > 92 78  77 85 86 89  BUN 32*   < > 46* 42*  43* 36* 35* 29*  CREATININE 2.05*   < > 2.55* 2.44*  2.43* 2.26* 2.32* 1.97*  CALCIUM 9.6   < > 9.3 9.3  9.2 9.0 9.1 9.2  AST 31  --   --   --   --   --   --   ALT 13  --   --   --   --   --   --   ALKPHOS 76  --   --   --   --   --   --   BILITOT 0.4  --   --   --   --   --   --    < > = values in this interval not displayed.   ------------------------------------------------------------------------------------------------------------------ No results for input(s): CHOL, HDL, LDLCALC, TRIG, CHOLHDL, LDLDIRECT in the last 72 hours.  No results found for: HGBA1C ------------------------------------------------------------------------------------------------------------------ No  results for input(s): TSH, T4TOTAL, T3FREE, THYROIDAB in the last 72 hours.  Invalid input(s): FREET3 ------------------------------------------------------------------------------------------------------------------ No results for input(s): VITAMINB12, FOLATE, FERRITIN, TIBC, IRON, RETICCTPCT in the last 72 hours.  Coagulation profile Recent Labs  Lab 12/09/19 1749  INR NOT CALCULATED    No results for input(s): DDIMER in the last 72 hours.  Cardiac Enzymes No results for input(s): CKMB, TROPONINI, MYOGLOBIN in the last 168 hours.  Invalid input(s): CK ------------------------------------------------------------------------------------------------------------------    Component Value Date/Time   BNP 1,010.0 (H) 12/11/2019 4076     Roxan Hockey M.D on 12/15/2019 at 1:17 PM  Go to [email or web address] - for contact info  [facility] - Office  [phone]

## 2019-12-15 NOTE — Progress Notes (Signed)
CSW uploaded requested documents for PASRR.  Tobi Bastos, LCSW Transitions of Care Clinical Social Worker [facility] Ph: [phone]

## 2019-12-15 NOTE — NC FL2 (Signed)
[place] LEVEL OF CARE SCREENING TOOL     IDENTIFICATION  Patient Name: Anthony Andrews Birthdate: 12-May-1946 Sex: male Admission Date (Current Location): 12/09/2019  [facility] and [place] Number:  [employer] and Address:  [place] [address], [place]      Provider Number: [id number]  Attending Physician Name and Address:  Roxan Hockey, MD  Relative Name and Phone Number:  Iona Beard [address]: Hospital Recommended Level of Care: [place] Prior Approval Number:    Date Approved/Denied:   PASRR Number:    Discharge Plan: SNF    Current Diagnoses: Patient Active Problem List   Diagnosis Date Noted  . Acute respiratory disease due to COVID-19 virus 12/15/2019  . Acute on chronic combined systolic and diastolic CHF (congestive heart failure) ([place])   . PAF (paroxysmal atrial fibrillation) ([place])   . Essential hypertension   . Bilateral carotid artery stenosis   . Anemia   . Stage 3 chronic kidney disease   . Pneumonia 12/09/2019  . Abnormal result of cardiovascular function study suggestive of non-ST elevation myocardial infarction (NSTEMI) 12/09/2019  . Acute on chronic HFrEF (heart failure with reduced ejection fraction) ([place]) 12/09/2019  . Non-ST elevation (NSTEMI) myocardial infarction ([place]) 12/09/2019  . Tobacco abuse 12/09/2019  . Acute respiratory failure with hypoxia ([place]) 09/15/2019  . Atrial fibrillation ([place]) 09/15/2019  . AKI (acute kidney injury) on CKD IIIa 09/12/2019  . Acute metabolic encephalopathy 07/37/1062  . Chronic hyponatremia 09/12/2019  . Metabolic acidosis 69/48/5462  . Rhabdomyolysis 09/11/2019    Orientation RESPIRATION BLADDER Height & Weight     Self, Time  Normal Continent Weight: 55.8 kg Height:  5\' 3"  (160 cm)  BEHAVIORAL SYMPTOMS/MOOD NEUROLOGICAL BOWEL NUTRITION STATUS        Diet(see DC summary)  AMBULATORY STATUS COMMUNICATION OF  NEEDS Skin   Limited Assist Verbally Normal                       Personal Care Assistance Level of Assistance  Bathing, Feeding, Dressing Bathing Assistance: Limited assistance Feeding assistance: Independent Dressing Assistance: Limited assistance     Functional Limitations Info  Sight, Hearing, Speech Sight Info: Adequate Hearing Info: Adequate Speech Info: Adequate    SPECIAL CARE FACTORS FREQUENCY  (covid positive)                    Contractures      Additional Factors Info  Code Status, Allergies Code Status Info: DNR Allergies Info: NKA           Current Medications (12/15/2019):  This is the current hospital active medication list Current Facility-Administered Medications  Medication Dose Route Frequency Provider Last Rate Last Admin  . 0.9 %  sodium chloride infusion  250 mL Intravenous PRN Emokpae, Courage, MD      . 0.9 %  sodium chloride infusion   Intravenous Continuous Emokpae, Courage, MD      . acetaminophen (TYLENOL) tablet 650 mg  650 mg Oral Q6H PRN Emokpae, Courage, MD       Or  . acetaminophen (TYLENOL) suppository 650 mg  650 mg Rectal Q6H PRN Emokpae, Courage, MD      . acetaminophen (TYLENOL) tablet 650 mg  650 mg Oral TID Roxan Hockey, MD   650 mg at 12/15/19 0940  . albuterol (VENTOLIN HFA) 108 (90 Base) MCG/ACT inhaler 2 puff  2  puff Inhalation TID Roxan Hockey, MD   2 puff at 12/15/19 1405  . alum & mag hydroxide-simeth (MAALOX/MYLANTA) 200-200-20 MG/5ML suspension 30 mL  30 mL Oral Q6H PRN Denton Brick, Courage, MD   30 mL at 12/11/19 2358  . apixaban (ELIQUIS) tablet 2.5 mg  2.5 mg Oral BID Emokpae, Courage, MD      . ascorbic acid (VITAMIN C) tablet 500 mg  500 mg Oral Daily Emokpae, Courage, MD      . aspirin EC tablet 81 mg  81 mg Oral Q breakfast Emokpae, Courage, MD   81 mg at 12/15/19 0939  . carvedilol (COREG) tablet 3.125 mg  3.125 mg Oral BID WC Emokpae, Courage, MD   3.125 mg at 12/15/19 0939  . cefTRIAXone  (ROCEPHIN) 1 g in sodium chloride 0.9 % 100 mL IVPB  1 g Intravenous Q24H Emokpae, Courage, MD 200 mL/hr at 12/14/19 1335 1 g at 12/14/19 1335  . chlorpheniramine-HYDROcodone (TUSSIONEX) 10-8 MG/5ML suspension 5 mL  5 mL Oral Q12H PRN Emokpae, Courage, MD      . cholecalciferol (VITAMIN D3) tablet 1,000 Units  1,000 Units Oral Daily Roxan Hockey, MD   1,000 Units at 12/15/19 0939  . doxycycline (VIBRA-TABS) tablet 100 mg  100 mg Oral Q12H Emokpae, Courage, MD   100 mg at 12/15/19 0940  . famotidine (PEPCID) tablet 10 mg  10 mg Oral Daily Roxan Hockey, MD   10 mg at 12/15/19 0939  . folic acid (FOLVITE) tablet 1 mg  1 mg Oral Daily Emokpae, Courage, MD      . guaiFENesin (MUCINEX) 12 hr tablet 600 mg  600 mg Oral BID Denton Brick, Courage, MD   600 mg at 12/15/19 0939  . guaiFENesin-dextromethorphan (ROBITUSSIN DM) 100-10 MG/5ML syrup 10 mL  10 mL Oral Q4H PRN Emokpae, Courage, MD      . hydrALAZINE (APRESOLINE) tablet 37.5 mg  37.5 mg Oral TID Arnoldo Lenis, MD   37.5 mg at 12/15/19 0939  . insulin aspart (novoLOG) injection 0-5 Units  0-5 Units Subcutaneous QHS Emokpae, Courage, MD      . insulin aspart (novoLOG) injection 0-9 Units  0-9 Units Subcutaneous TID WC Emokpae, Courage, MD      . ipratropium-albuterol (DUONEB) 0.5-2.5 (3) MG/3ML nebulizer solution 3 mL  3 mL Nebulization Q4H PRN Kathie Dike, MD      . isosorbide mononitrate (IMDUR) 24 hr tablet 15 mg  15 mg Oral Daily Arnoldo Lenis, MD   15 mg at 12/15/19 [phone]  . loratadine (CLARITIN) tablet 10 mg  10 mg Oral Daily Emokpae, Courage, MD   10 mg at 12/15/19 0940  . multivitamin with minerals tablet 1 tablet  1 tablet Oral Daily Emokpae, Courage, MD      . ondansetron (ZOFRAN) tablet 4 mg  4 mg Oral Q6H PRN Emokpae, Courage, MD       Or  . ondansetron (ZOFRAN) injection 4 mg  4 mg Intravenous Q6H PRN Emokpae, Courage, MD   4 mg at 12/15/19 1100  . PARoxetine (PAXIL) tablet 20 mg  20 mg Oral Daily Emokpae, Courage, MD   20  mg at 12/15/19 0939  . polyethylene glycol (MIRALAX / GLYCOLAX) packet 17 g  17 g Oral Daily PRN Roxan Hockey, MD   17 g at 12/10/19 2231  . predniSONE (DELTASONE) tablet 50 mg  50 mg Oral Once Emokpae, Courage, MD      . rosuvastatin (CRESTOR) tablet 10 mg  10 mg Oral QPM Emokpae, Courage,  MD   10 mg at 12/14/19 1701  . sodium bicarbonate tablet 650 mg  650 mg Oral BID Roxan Hockey, MD   650 mg at 12/15/19 0939  . sodium chloride flush (NS) 0.9 % injection 3 mL  3 mL Intravenous Q12H Emokpae, Courage, MD   3 mL at 12/15/19 0940  . sodium chloride flush (NS) 0.9 % injection 3 mL  3 mL Intravenous PRN Emokpae, Courage, MD      . sodium chloride flush (NS) 0.9 % injection 3 mL  3 mL Intravenous Q12H Cecilie Kicks R, NP      . traZODone (DESYREL) tablet 50 mg  50 mg Oral QHS PRN Emokpae, Courage, MD      . zinc sulfate capsule 220 mg  220 mg Oral Daily Emokpae, Courage, MD         Discharge Medications: Please see discharge summary for a list of discharge medications.  Relevant Imaging Results:  Relevant Lab Results:   Additional Information SSN [id number] [phone]  Caycee Wanat, Chauncey Reading, RN

## 2019-12-15 NOTE — Progress Notes (Signed)
CRITICAL VALUE STICKER  CRITICAL VALUE: + COVID test  RECEIVER (on-site recipient of call): Era Skeen, RN  [place] NOTIFIED: 12/15/2019 @ 1254  MESSENGER (representative from lab): S. Grandville Silos  MD NOTIFIED: Dr. Maurene Capes  TIME OF NOTIFICATION: 1300  RESPONSE: patient placed in airborne/contact isolation

## 2019-12-15 NOTE — Progress Notes (Signed)
Pt transferred via bed to room #340. Negative pressure with airborne and contact precautions. Pt oriented to new room and staff. PT report received from Vista Deck, RN. Condom cath replaced due to dislodged. Bed linens changed. IVF infusing without difficulty. Breath sounds coarse right mid and lower chest, congested cough but pt states non-productive. Denies SOB at present, resp even and non-labored. Skin warm and dry, color pale, no cyanosis noted. SCD's on per order. VSS.

## 2019-12-16 ENCOUNTER — Other Ambulatory Visit (HOSPITAL_COMMUNITY): Payer: Medicare PPO

## 2019-12-16 ENCOUNTER — Encounter (HOSPITAL_COMMUNITY)
Admission: EM | Disposition: A | Discharge status: Skilled Nursing Facility | Payer: Self-pay | Source: Skilled Nursing Facility | Attending: Internal Medicine

## 2019-12-16 ENCOUNTER — Ambulatory Visit (HOSPITAL_COMMUNITY): Payer: Medicare PPO | Admitting: Speech Pathology

## 2019-12-16 ENCOUNTER — Encounter (HOSPITAL_COMMUNITY): Payer: Self-pay

## 2019-12-16 DIAGNOSIS — I5023 Acute on chronic systolic (congestive) heart failure: Secondary | ICD-10-CM

## 2019-12-16 LAB — CBC WITH DIFFERENTIAL/PLATELET
Abs Immature Granulocytes: 0.01 10*3/uL (ref 0.00–0.07)
Basophils Absolute: 0 10*3/uL (ref 0.0–0.1)
Basophils Relative: 1 %
Eosinophils Absolute: 0 10*3/uL (ref 0.0–0.5)
Eosinophils Relative: 0 %
HCT: 28.9 % — ABNORMAL LOW (ref 39.0–52.0)
Hemoglobin: 9.1 g/dL — ABNORMAL LOW (ref 13.0–17.0)
Immature Granulocytes: 0 %
Lymphocytes Relative: 21 %
Lymphs Abs: 0.9 10*3/uL (ref 0.7–4.0)
MCH: 30.6 pg (ref 26.0–34.0)
MCHC: 31.5 g/dL (ref 30.0–36.0)
MCV: 97.3 fL (ref 80.0–100.0)
Monocytes Absolute: 0.1 10*3/uL (ref 0.1–1.0)
Monocytes Relative: 3 %
Neutro Abs: 3.2 10*3/uL (ref 1.7–7.7)
Neutrophils Relative %: 75 %
Platelets: 394 10*3/uL (ref 150–400)
RBC: 2.97 MIL/uL — ABNORMAL LOW (ref 4.22–5.81)
RDW: 13.9 % (ref 11.5–15.5)
WBC: 4.3 10*3/uL (ref 4.0–10.5)
nRBC: 0 % (ref 0.0–0.2)

## 2019-12-16 LAB — COMPREHENSIVE METABOLIC PANEL
ALT: 16 U/L (ref 0–44)
AST: 18 U/L (ref 15–41)
Albumin: 2.6 g/dL — ABNORMAL LOW (ref 3.5–5.0)
Alkaline Phosphatase: 64 U/L (ref 38–126)
Anion gap: 9 (ref 5–15)
BUN: 35 mg/dL — ABNORMAL HIGH (ref 8–23)
CO2: 18 mmol/L — ABNORMAL LOW (ref 22–32)
Calcium: 9.4 mg/dL (ref 8.9–10.3)
Chloride: 103 mmol/L (ref 98–111)
Creatinine, Ser: 2.17 mg/dL — ABNORMAL HIGH (ref 0.61–1.24)
GFR calc Af Amer: 34 mL/min — ABNORMAL LOW (ref >60)
GFR calc non Af Amer: 30 mL/min — ABNORMAL LOW (ref >60)
Glucose, Bld: 117 mg/dL — ABNORMAL HIGH (ref 70–99)
Potassium: 4.6 mmol/L (ref 3.5–5.1)
Sodium: 130 mmol/L — ABNORMAL LOW (ref 135–145)
Total Bilirubin: 0.5 mg/dL (ref 0.3–1.2)
Total Protein: 5.9 g/dL — ABNORMAL LOW (ref 6.5–8.1)

## 2019-12-16 LAB — MAGNESIUM: Magnesium: 2.1 mg/dL (ref 1.7–2.4)

## 2019-12-16 LAB — D-DIMER, QUANTITATIVE: D-Dimer, Quant: 6.64 ug/mL-FEU — ABNORMAL HIGH (ref 0.00–0.50)

## 2019-12-16 LAB — FERRITIN: Ferritin: 54 ng/mL (ref 24–336)

## 2019-12-16 LAB — GLUCOSE, CAPILLARY
Glucose-Capillary: 104 mg/dL — ABNORMAL HIGH (ref 70–99)
Glucose-Capillary: 109 mg/dL — ABNORMAL HIGH (ref 70–99)
Glucose-Capillary: 97 mg/dL (ref 70–99)
Glucose-Capillary: 100 mg/dL — ABNORMAL HIGH (ref 70–99)

## 2019-12-16 LAB — C-REACTIVE PROTEIN: CRP: 0.8 mg/dL (ref <1.0)

## 2019-12-16 LAB — PHOSPHORUS: Phosphorus: 3.7 mg/dL (ref 2.5–4.6)

## 2019-12-16 SURGERY — RIGHT/LEFT HEART CATH AND CORONARY ANGIOGRAPHY
Anesthesia: LOCAL

## 2019-12-16 MED ORDER — FUROSEMIDE 40 MG PO TABS
60.0000 mg | ORAL_TABLET | Freq: Every day | ORAL | Status: DC
  Administered 2019-12-16: 60 mg via ORAL
  Filled 2019-12-16 (×2): qty 1

## 2019-12-16 NOTE — Progress Notes (Signed)
Patient diagnosed with COVID during this admission. From cardiac standpoint we had considered an inpatient cath however given his new COVID + status and nonurgency of procedure we will plan for outpatient cath once recovered from [place] depending on his renal function going forward. Continue current CHF regimen, would start lasix oral 60mg  daily. No further cardiac recs at this time, we will arrange outpatient f/u   Carlyle Dolly MD

## 2019-12-16 NOTE — Progress Notes (Signed)
Pt resting quietly in bed, denies c/o, denies any need. Requested plain cheeseburger for supper, dietary notified of request.  Pt states no gas or feeling of need for BM today, took all of Miralax given this am. Pt has put out 853ml of clear yellow urine this shift via condom cath.

## 2019-12-16 NOTE — Progress Notes (Signed)
Physical Therapy Treatment Patient Details Name: Anthony Andrews MRN: [id number] DOB: 05-22-1946 Today's Date: 12/16/2019    History of Present Illness Anthony Andrews  is a 74 y.o. male with PMHx of Prior CVA with mild residual left-sided weakness, HTN, ongoing tobacco abuse, PAF on chronic anticoagulation with Eliquis, chronic anemia, recurrent falls syncope and status post 30-day event monitor placed on 09/16/2020 who presents from [facility] with shortness of breath    PT Comments    Patient demonstrates good return for completing BLE ROM/strengthening exercises while seated at bedside without loss of balance, increased endurance/distance for taking steps in room with slow labored cadence, limited secondary to c/o fatigue and tolerated sitting up in chair after therapy - RN notified.  Patient will benefit from continued physical therapy in hospital and recommended venue below to increase strength, balance, endurance for safe ADLs and gait.    Follow Up Recommendations  SNF     Equipment Recommendations  None recommended by PT    Recommendations for Other Services       Precautions / Restrictions Precautions Precautions: Fall Restrictions Weight Bearing Restrictions: No    Mobility  Bed Mobility Overal bed mobility: Needs Assistance Bed Mobility: Supine to Sit     Supine to sit: Min guard     General bed mobility comments: increased time, labored movement  Transfers Overall transfer level: Needs assistance Equipment used: Rolling walker (2 wheeled) Transfers: Sit to/from [facility] Sit to Stand: Min guard;Min assist Stand pivot transfers: Min guard;Min assist       General transfer comment: slow labored movement  Ambulation/Gait Ambulation/Gait assistance: Min assist Gait Distance (Feet): 20 Feet Assistive device: Rolling walker (2 wheeled) Gait Pattern/deviations: Step-through pattern;Decreased step length - left;Decreased step length -  right;Decreased stride length Gait velocity: decreased   General Gait Details: slow labored unsteady cadence, limited due to fatigue, no loss of balance   Stairs             Wheelchair Mobility    Modified Rankin (Stroke Patients Only)       Balance Overall balance assessment: Needs assistance Sitting-balance support: Feet supported;No upper extremity supported Sitting balance-Leahy Scale: Fair Sitting balance - Comments: fair/good seated at EOB   Standing balance support: During functional activity;Bilateral upper extremity supported Standing balance-Leahy Scale: Fair Standing balance comment: using RW                            Cognition Arousal/Alertness: Awake/alert Behavior During Therapy: WFL for tasks assessed/performed Overall Cognitive Status: Within Functional Limits for tasks assessed                                        Exercises General Exercises - Lower Extremity Long Arc Quad: Seated;AROM;Strengthening;Both;10 reps Hip Flexion/Marching: Seated;AROM;Strengthening;Both;10 reps Toe Raises: Seated;AROM;Both;10 reps;Strengthening Heel Raises: Seated;AROM;Strengthening;Both;10 reps    General Comments        Pertinent Vitals/Pain Pain Assessment: No/denies pain    Home Living                      Prior Function            PT Goals (current goals can now be found in the care plan section) Acute Rehab PT Goals Patient Stated Goal: Return to SNF PT Goal Formulation: With patient Time For Goal Achievement: 12/25/19 Potential  to Achieve Goals: Good Progress towards PT goals: Progressing toward goals    Frequency    Min 3X/week      PT Plan Current plan remains appropriate    Co-evaluation              AM-PAC PT "6 Clicks" Mobility   Outcome Measure  Help needed turning from your back to your side while in a flat bed without using bedrails?: None Help needed moving from lying on your back  to sitting on the side of a flat bed without using bedrails?: A Little Help needed moving to and from a bed to a chair (including a wheelchair)?: A Little Help needed standing up from a chair using your arms (e.g., wheelchair or bedside chair)?: A Little Help needed to walk in hospital room?: A Lot Help needed climbing 3-5 steps with a railing? : A Lot 6 Click Score: 17    End of Session   Activity Tolerance: Patient tolerated treatment well;Patient limited by fatigue Patient left: in chair;with call bell/phone within reach;with chair alarm set Nurse Communication: Mobility status PT Visit Diagnosis: Unsteadiness on feet (R26.81);Other abnormalities of gait and mobility (R26.89);Muscle weakness (generalized) (M62.81)     Time: 7416-3845 PT Time Calculation (min) (ACUTE ONLY): 29 min  Charges:  $Therapeutic Exercise: 8-22 mins $Therapeutic Activity: 8-22 mins                     4:26 PM, 12/16/19 [name], MPT Physical Therapist with [facility] [phone] office [phone] mobile phone

## 2019-12-16 NOTE — TOC Progression Note (Addendum)
Transition of Care [facility]) - Progression Note    Patient Details  Name: Anthony Andrews MRN: [id number] Date of Birth: [birth year]  [facility] CM/SW Contact  Ihor Gully, LCSW Phone Number: 12/16/2019, 1:32 PM  Clinical Narrative:    Bed offer from [facility]. Message left for [facility] in admissions regarding bed offer and availability. SNF auth started.     Expected Discharge Plan: [place] Barriers to Discharge: Continued Medical Work up, SNF Pending bed offer  Expected Discharge Plan and Services Expected Discharge Plan: [place] Choice: [place] arrangements for the past 2 months: [place]                                       Social Determinants of Health (SDOH) Interventions    Readmission Risk Interventions Readmission Risk Prevention Plan 12/14/2019  Transportation Screening Complete  PCP or Specialist Appt within 3-5 Days Not Complete  HRI or [place] Not Complete  Social Work Consult for [place] Planning/Counseling Complete  Palliative Care Screening Not Complete  Medication Review [employer]) Complete

## 2019-12-16 NOTE — Care Management Important Message (Signed)
Important Message  Patient Details  Name: Anthony Andrews MRN: [id number] Date of Birth: 06/30/1946   Medicare Important Message Given:  Yes(Crystal, RN agreed to deliver due to contact precautions)     [name] 12/16/2019, 2:52 PM

## 2019-12-16 NOTE — Progress Notes (Signed)
PROGRESS NOTE  Anthony Andrews ZDG:[id number] DOB: 1946-09-02 DOA: 12/09/2019 PCP: [name], MD  Brief History:  74 year old male with a history of stroke with residual left hemiparesis, hypertension, tobacco abuse, COPD, paroxysmal atrial fibrillation, anemia of chronic disease, hyperlipidemia presenting with shortness of breath and wheezing with nonproductive cough for 1 day.  Apparently the patient was having chest discomfort at that time.  Upon EMS arrival, the patient's oxygen saturation was 90% on room air.  He was placed on 4 L.  Since admission, the patient was noted to have elevated troponin peaking at 4848.  The patient was started on IV heparin which has been since transition to oral apixaban.  Cardiology was consulted and followed the patient.  The patient was treated for acute on chronic systolic and diastolic CHF with IV furosemide.  He has improved clinically and has been since weaned off of oxygen.  Notably, the patient had already received his 2 doses of the COVID-19 vaccination with his last dose sometime in February 2021.  In addition, the patient was tested positive for COVID-19 in January 2021. Nevertheless, the patient had a negative SARS-CoV2 RT-PCR on 12/09/19.  As part of the screening process for SNF placement, he tested positive on SARS-CoV2 RT-PCR  Assessment/Plan: Acute respiratory failure with hypoxia Secondary to CHF and pneumonia -Patient has been weaned to room air  Acute on chronic systolic and diastolic CHF -5/95/6387 echo EF 40%, grade 2 DD, mild AS -Restart furosemide 60 mg p.o. daily -Echo denies any -Daily weights - baseline weight119-125 lbs, admitted 132 lbs and BNP 3459 and CXR consistent with edema - standing weight down to 119 lbs this admission. -Restarting oral furosemide 3/24  Pulmonary infiltrate/pneumonia -Patient finished 7 days of ceftriaxone and doxycycline  COVID-19 infection -12/09/2019 COVID-19 RT-PCR negative -12/16/2019  COVID-19 RT-PCR positive -Patient currently is essentially asymptomatic -No indication for steroids or remdesivir presently  Elevated troponin -Personally reviewed EKG--nonspecific ST-T wave changes -No chest pain presently -Appreciate cardiology follow-up--outpt cath once recovered from COVID-19 -Patient initially on IV heparin -Now transition to apixaban  Paroxysmal atrial fibrillation -Rate control -in sinus presently -Continue apixaban  Acute on chronic renal failure--CKD stage IIIa -Baseline creatinine 1.2-1.4 -Restart p.o. furosemide and monitor  Anemia of chronic disease -No evidence of ongoing bleeding, PTA  the patient was on Eliquis, -restart Eliquis -Stool occult blood Negative  history of second-degree AV block/history of tachyarrhythmia  -patient had a 30-day event monitor placed on 11/18/2019 -Preliminary review of his 30-day event monitor shows no significant arrhythmias -Continue Coreg, stop IV heparin for now -Restart Eliquis  Generalized Weakness/deconditioning- -PT eval appreciated recommends SNF rehab  Tobacco abuse- -smoking cessation strongly advised, patient is not ready to quit       Disposition Plan: Patient From: Home D/C Place: SNF - 1-2  Days Barriers: new COVID-19 infection making placement difficult  Family Communication:  No Family at bedside  Consultants:  cardiology  Code Status:  DNR  DVT Prophylaxis: apixaban   Procedures: As Listed in Progress Note Above  Antibiotics: Ceftriaxone/doxy 3/17>>>3/23    Total time spent 35 minutes.  Greater than 50% spent face to face counseling and coordinating care.    Subjective: Patient denies fevers, chills, headache, chest pain, dyspnea, nausea, vomiting, diarrhea, abdominal pain, dysuria, hematuria, hematochezia, and melena.   Objective: Vitals:   12/16/19 0336 12/16/19 0611 12/16/19 0743 12/16/19 0830  BP:  119/79    Pulse:  80  82  Resp:  18  18  Temp:  98 F (36.7  C)    TempSrc:  Oral    SpO2:  97% 96% 99%  Weight: 56.3 kg     Height:        Intake/Output Summary (Last 24 hours) at 12/16/2019 0905 Last data filed at 12/16/2019 0901 Gross per 24 hour  Intake 992.04 ml  Output 300 ml  Net 692.04 ml   Weight change:  Exam:   General:  Pt is alert, follows commands appropriately, not in acute distress  HEENT: No icterus, No thrush, No neck mass, [place]/AT  Cardiovascular: RRR, S1/S2, no rubs, no gallops  Respiratory: bilateral rales, R>L. No wheeze  Abdomen: Soft/+BS, non tender, non distended, no guarding  Extremities: No edema, No lymphangitis, No petechiae, No rashes, no synovitis   Data Reviewed: I have personally reviewed following labs and imaging studies Basic Metabolic Panel: Recent Labs  Lab 12/12/19 0404 12/13/19 0412 12/14/19 0445 12/15/19 0832 12/16/19 0517  NA 135  134* 132* 133* 132* 130*  K 4.8  4.7 4.4 4.1 3.9 4.6  CL 104  104 104 105 106 103  CO2 21*  21* 22 20* 20* 18*  GLUCOSE 78  77 85 86 89 117*  BUN 42*  43* 36* 35* 29* 35*  CREATININE 2.44*  2.43* 2.26* 2.32* 1.97* 2.17*  CALCIUM 9.3  9.2 9.0 9.1 9.2 9.4  MG  --   --   --   --  2.1  PHOS 2.9  --  2.7  --  3.7   Liver Function Tests: Recent Labs  Lab 12/09/19 1115 12/12/19 0404 12/14/19 0445 12/16/19 0517  AST 31  --   --  18  ALT 13  --   --  16  ALKPHOS 76  --   --  64  BILITOT 0.4  --   --  0.5  PROT 6.6  --   --  5.9*  ALBUMIN 2.9* 2.6* 2.6* 2.6*   No results for input(s): LIPASE, AMYLASE in the last 168 hours. No results for input(s): AMMONIA in the last 168 hours. Coagulation Profile: Recent Labs  Lab 12/09/19 1749  INR NOT CALCULATED   CBC: Recent Labs  Lab 12/09/19 1115 12/10/19 0613 12/12/19 0404 12/13/19 0412 12/14/19 0445 12/15/19 0437 12/16/19 0517  WBC 7.9   < > 8.2 7.5 7.0 6.7 4.3  NEUTROABS 5.5  --   --   --   --   --  3.2  HGB 8.8*   < > 7.8* 8.4* 8.9* 8.8* 9.1*  HCT 27.6*   < > 24.7* 26.7* 26.4* 27.3*  28.9*  MCV 98.9   < > 97.6 97.4 97.8 98.9 97.3  PLT 456*   < > 425* 416* 442* 376 394   < > = values in this interval not displayed.   Cardiac Enzymes: No results for input(s): CKTOTAL, CKMB, CKMBINDEX, TROPONINI in the last 168 hours. BNP: Invalid input(s): POCBNP CBG: Recent Labs  Lab 12/12/19 0749 12/12/19 1108 12/15/19 1733 12/15/19 2042 12/16/19 0753  GLUCAP 80 102* 95 138* 104*   HbA1C: No results for input(s): HGBA1C in the last 72 hours. Urine analysis:    Component Value Date/Time   COLORURINE YELLOW 09/12/2019 0153   APPEARANCEUR HAZY (A) 09/12/2019 0153   LABSPEC 1.015 09/12/2019 0153   PHURINE 6.0 09/12/2019 0153   GLUCOSEU NEGATIVE 09/12/2019 0153   HGBUR NEGATIVE 09/12/2019 0153   BILIRUBINUR NEGATIVE 09/12/2019 0153   KETONESUR 5 (A) 09/12/2019  0153   PROTEINUR >=300 (A) 09/12/2019 0153   NITRITE NEGATIVE 09/12/2019 0153   LEUKOCYTESUR NEGATIVE 09/12/2019 0153   Sepsis Labs: @LABRCNTIP (procalcitonin:4,lacticidven:4) ) Recent Results (from the past 240 hour(s))  Respiratory Panel by RT PCR (Flu A&B, Covid) - Nasopharyngeal Swab     Status: None   Collection Time: 12/09/19  2:21 PM   Specimen: Nasopharyngeal Swab  Result Value Ref Range Status   SARS Coronavirus 2 by RT PCR NEGATIVE NEGATIVE Final    Comment: (NOTE) SARS-CoV-2 target nucleic acids are NOT DETECTED. The SARS-CoV-2 RNA is generally detectable in upper respiratoy specimens during the acute phase of infection. The lowest concentration of SARS-CoV-2 viral copies this assay can detect is 131 copies/mL. A negative result does not preclude SARS-Cov-2 infection and should not be used as the sole basis for treatment or other patient management decisions. A negative result may occur with  improper specimen collection/handling, submission of specimen other than nasopharyngeal swab, presence of viral mutation(s) within the areas targeted by this assay, and inadequate number of viral  copies (<131 copies/mL). A negative result must be combined with clinical observations, patient history, and epidemiological information. The expected result is Negative. Fact Sheet for Patients:  PinkCheek.be Fact Sheet for Healthcare Providers:  GravelBags.it This test is not yet ap proved or cleared by the [place] FDA and  has been authorized for detection and/or diagnosis of SARS-CoV-2 by FDA under an Emergency Use Authorization (EUA). This EUA will remain  in effect (meaning this test can be used) for the duration of the COVID-19 declaration under Section 564(b)(1) of the Act, 21 U.S.C. section 360bbb-3(b)(1), unless the authorization is terminated or revoked sooner.    Influenza A by PCR NEGATIVE NEGATIVE Final   Influenza B by PCR NEGATIVE NEGATIVE Final    Comment: (NOTE) The Xpert Xpress SARS-CoV-2/FLU/RSV assay is intended as an aid in  the diagnosis of influenza from Nasopharyngeal swab specimens and  should not be used as a sole basis for treatment. Nasal washings and  aspirates are unacceptable for Xpert Xpress SARS-CoV-2/FLU/RSV  testing. Fact Sheet for Patients: PinkCheek.be Fact Sheet for Healthcare Providers: GravelBags.it This test is not yet approved or cleared by the [place] FDA and  has been authorized for detection and/or diagnosis of SARS-CoV-2 by  FDA under an Emergency Use Authorization (EUA). This EUA will remain  in effect (meaning this test can be used) for the duration of the  Covid-19 declaration under Section 564(b)(1) of the Act, 21  U.S.C. section 360bbb-3(b)(1), unless the authorization is  terminated or revoked. Performed at [facility], [address] [place], [place] [address]   Respiratory Panel by RT PCR (Flu A&B, Covid) - Nasopharyngeal Swab     Status: Abnormal   Collection Time: 12/15/19 11:15 AM   Specimen:  Nasopharyngeal Swab  Result Value Ref Range Status   SARS Coronavirus 2 by RT PCR POSITIVE (A) NEGATIVE Final    Comment: RESULT CALLED TO, READ BACK BY AND VERIFIED WITH: BARKER B. AT 1254 ON [id number] BY THOMPSON S. (NOTE) SARS-CoV-2 target nucleic acids are DETECTED. SARS-CoV-2 RNA is generally detectable in upper respiratory specimens  during the acute phase of infection. Positive results are indicative of the presence of the identified virus, but do not rule out bacterial infection or co-infection with other pathogens not detected by the test. Clinical correlation with patient history and other diagnostic information is necessary to determine patient infection status. The expected result is Negative. Fact Sheet for Patients:  PinkCheek.be Fact Sheet for Healthcare Providers: GravelBags.it This test is not yet approved or cleared by the [place] FDA and  has been authorized for detection and/or diagnosis of SARS-CoV-2 by FDA under an Emergency Use Authorization (EUA).  This EUA will remain in effect (meaning this test can be  used) for the duration of  the COVID-19 declaration under Section 564(b)(1) of the Act, 21 U.S.C. section 360bbb-3(b)(1), unless the authorization is terminated or revoked sooner.    Influenza A by PCR NEGATIVE NEGATIVE Final   Influenza B by PCR NEGATIVE NEGATIVE Final    Comment: (NOTE) The Xpert Xpress SARS-CoV-2/FLU/RSV assay is intended as an aid in  the diagnosis of influenza from Nasopharyngeal swab specimens and  should not be used as a sole basis for treatment. Nasal washings and  aspirates are unacceptable for Xpert Xpress SARS-CoV-2/FLU/RSV  testing. Fact Sheet for Patients: PinkCheek.be Fact Sheet for Healthcare Providers: GravelBags.it This test is not yet approved or cleared by the [place] FDA and  has been authorized  for detection and/or diagnosis of SARS-CoV-2 by  FDA under an Emergency Use Authorization (EUA). This EUA will remain  in effect (meaning this test can be used) for the duration of the  Covid-19 declaration under Section 564(b)(1) of the Act, 21  U.S.C. section 360bbb-3(b)(1), unless the authorization is  terminated or revoked. Performed at [facility], [address] [place], [place] [address]      Scheduled Meds: . acetaminophen  650 mg Oral TID  . apixaban  2.5 mg Oral BID  . vitamin C  500 mg Oral Daily  . aspirin EC  81 mg Oral Q breakfast  . carvedilol  3.125 mg Oral BID WC  . cholecalciferol  1,000 Units Oral Daily  . famotidine  10 mg Oral Daily  . folic acid  1 mg Oral Daily  . guaiFENesin  600 mg Oral BID  . hydrALAZINE  37.5 mg Oral TID  . insulin aspart  0-5 Units Subcutaneous QHS  . insulin aspart  0-9 Units Subcutaneous TID WC  . isosorbide mononitrate  15 mg Oral Daily  . loratadine  10 mg Oral Daily  . multivitamin with minerals  1 tablet Oral Daily  . PARoxetine  20 mg Oral Daily  . rosuvastatin  10 mg Oral QPM  . sodium bicarbonate  650 mg Oral BID  . sodium chloride flush  3 mL Intravenous Q12H  . sodium chloride flush  3 mL Intravenous Q12H  . zinc sulfate  220 mg Oral Daily   Continuous Infusions: . sodium chloride      Procedures/Studies: DG Chest 2 View  Result Date: 12/15/2019 CLINICAL DATA:  Dyspnea, weakness, history of COVID-19 EXAM: CHEST - 2 VIEW COMPARISON:  12/11/2019 chest radiograph. FINDINGS: Stable metallic device in the ventral medial right chest wall. Fixation screw overlies the left glenoid. Stable cardiomediastinal silhouette with normal heart size. No pneumothorax. Small right pleural effusion is decreased. Resolved left pleural effusion. Patchy hazy and reticular opacities in both lungs appear improved with significantly improved aeration at the lung bases. IMPRESSION: 1. Significantly improved aeration at the lung bases with  persistent mild patchy hazy and reticular opacities in both lungs, which could represent atypical pneumonia or nonspecific scarring. 2. Small right pleural effusion, decreased. Resolved left pleural effusion. Electronically Signed   By: Ilona Sorrel M.D.   On: 12/15/2019 10:53   DG Chest 2 View  Result Date: 12/11/2019 CLINICAL DATA:  Shortness of breath with atrial fibrillation  and hypertension EXAM: CHEST - 2 VIEW COMPARISON:  December 09, 2019 chest radiograph and chest CT September 15, 2020 FINDINGS: Underlying emphysematous change is noted, most severe in the right upper lobe. There is a right pleural effusion with airspace opacity in the right lower lobe. There is atelectatic change in the left base. Heart is mildly enlarged. There is mild interstitial edema. Heart is slightly enlarged with pulmonary vascularity reflecting the underlying emphysema. There is aortic atherosclerosis. There is a loop recorder anteriorly. There is postoperative change in the left medial shoulder region. IMPRESSION: Findings most consistent with congestive heart failure superimposed on emphysematous change. Cardiomegaly is present with interstitial edema. Right pleural effusion. There is suspected superimposed right lower lobe pneumonia. There is aortic atherosclerosis. Aortic Atherosclerosis (ICD10-I70.0) and Emphysema (ICD10-J43.9). Electronically Signed   By: Lowella Grip III M.D.   On: 12/11/2019 11:14   DG Chest Portable 1 View  Result Date: 12/09/2019 CLINICAL DATA:  EMS reports pt is a residen at [facility]. Reports sob and cp since last night. Per Pts chart Pt has had COVID - unsure when. Hx A-fib, stroke, smoker EXAM: PORTABLE CHEST 1 VIEW COMPARISON:  09/15/2019 FINDINGS: Lungs density interstitial hazy airspace lung opacities, with more confluent opacity in the right lung base, obscuring the hemidiaphragm. Cardiac silhouette is normal in size.  Mediastinal hilar masses. Probable pleural effusions, larger on the  right.  No pneumothorax. IMPRESSION: 1. Interstitial and hazy airspace lung opacities bilaterally with more confluent opacity noted in the right lung base. Findings may reflect multifocal pneumonia or pulmonary edema. Suspect right lung base opacity due to a combination of pleural fluid with either atelectasis or more confluent infection. Electronically Signed   By: Lajean Manes M.D.   On: 12/09/2019 11:34   ECHOCARDIOGRAM COMPLETE  Result Date: 12/09/2019    ECHOCARDIOGRAM REPORT   Patient Name:   Anthony Andrews Date of Exam: 12/09/2019 Medical Rec #:  [id number]   Height:       63.0 in Accession #:    [id number]  Weight:       125.0 lb Date of Birth:  11-15-1945  BSA:          1.584 m Patient Age:    74 years    BP:           140/78 mmHg Patient Gender: M           HR:           90 bpm. Exam Location:  [facility] Procedure: 2D Echo Indications:    Dyspnea 786.09 / R06.00  History:        Patient has prior history of Echocardiogram examinations, most                 recent 09/17/2019. Stroke, Arrythmias:Atrial Fibrillation; Risk                 Factors:Current Smoker. Rhabdomyolysis, Pneumonia.  Sonographer:    Leavy Cella RDCS (AE) Referring Phys: [id number] [place] IMPRESSIONS  1. Left ventricular ejection fraction, by estimation, is 40%. The left ventricle has moderately decreased function. The left ventricle demonstrates global hypokinesis. Left ventricular diastolic parameters are consistent with Grade II diastolic dysfunction (pseudonormalization).  2. Right ventricular systolic function is normal. The right ventricular size is normal. There is moderately elevated pulmonary artery systolic pressure.  3. The mitral valve is grossly normal. Mild mitral valve regurgitation.  4. Morphologically, there appears to be mild aortic stenosis. The aortic valve is tricuspid.  Aortic valve regurgitation is not visualized.  5. The inferior vena cava is normal in size with greater than 50% respiratory variability,  suggesting right atrial pressure of 3 mmHg. FINDINGS  Left Ventricle: Left ventricular ejection fraction, by estimation, is 40%. The left ventricle has moderately decreased function. The left ventricle demonstrates global hypokinesis. The left ventricular internal cavity size was normal in size. There is no left ventricular hypertrophy. Left ventricular diastolic parameters are consistent with Grade II diastolic dysfunction (pseudonormalization). Indeterminate filling pressures. Right Ventricle: The right ventricular size is normal. No increase in right ventricular wall thickness. Right ventricular systolic function is normal. There is moderately elevated pulmonary artery systolic pressure. The tricuspid regurgitant velocity is 3.08 m/s, and with an assumed right atrial pressure of 10 mmHg, the estimated right ventricular systolic pressure is 68.3 mmHg. Left Atrium: Left atrial size was normal in size. Right Atrium: Right atrial size was normal in size. Pericardium: There is no evidence of pericardial effusion. Mitral Valve: The mitral valve is grossly normal. Mild mitral valve regurgitation. Tricuspid Valve: The tricuspid valve is grossly normal. Tricuspid valve regurgitation is mild. Aortic Valve: Morphologically, there appears to be mild aortic stenosis. The aortic valve is tricuspid. . There is mild thickening and mild calcification of the aortic valve. Aortic valve regurgitation is not visualized. Mild aortic valve annular calcification. There is mild thickening of the aortic valve. There is mild calcification of the aortic valve. Pulmonic Valve: The pulmonic valve was grossly normal. Pulmonic valve regurgitation is not visualized. Aorta: The aortic root is normal in size and structure. Venous: The inferior vena cava is normal in size with greater than 50% respiratory variability, suggesting right atrial pressure of 3 mmHg. IAS/Shunts: No atrial level shunt detected by color flow Doppler.  LEFT VENTRICLE PLAX  2D LVIDd:         4.76 cm  Diastology LVIDs:         4.10 cm  LV e' lateral:   6.96 cm/s LV PW:         0.84 cm  LV E/e' lateral: 9.4 LV IVS:        0.79 cm  LV e' medial:    5.98 cm/s LVOT diam:     2.10 cm  LV E/e' medial:  11.0 LVOT Area:     3.46 cm  RIGHT VENTRICLE RV S prime:     11.60 cm/s TAPSE (M-mode): 1.7 cm LEFT ATRIUM             Index       RIGHT ATRIUM           Index LA diam:        4.00 cm 2.53 cm/m  RA Area:     13.90 cm LA Vol (A2C):   74.9 ml 47.30 ml/m RA Volume:   37.30 ml  23.55 ml/m LA Vol (A4C):   42.6 ml 26.90 ml/m LA Biplane Vol: 56.1 ml 35.43 ml/m   AORTA Ao Root diam: 2.80 cm MITRAL VALVE               TRICUSPID VALVE MV Area (PHT): 2.91 cm    TR Peak grad:   37.9 mmHg MV Decel Time: 261 msec    TR Vmax:        308.00 cm/s MR Peak grad: 42.2 mmHg MR Mean grad: 29.0 mmHg    SHUNTS MR Vmax:      325.00 cm/s  Systemic Diam: 2.10 cm MR Vmean:     254.0  cm/s MV E velocity: 65.50 cm/s MV A velocity: 41.70 cm/s MV E/A ratio:  1.57 Kate Sable MD Electronically signed by Kate Sable MD Signature Date/Time: 12/09/2019/3:20:59 PM    Final    ECHOCARDIOGRAM LIMITED  Result Date: 12/11/2019    ECHOCARDIOGRAM LIMITED REPORT   Patient Name:   Anthony Andrews Date of Exam: 12/11/2019 Medical Rec #:  [id number]   Height:       63.0 in Accession #:    [id number]  Weight:       119.6 lb Date of Birth:  [birth year]  BSA:          1.554 m Patient Age:    70 years    BP:           114/81 mmHg Patient Gender: M           HR:           73 bpm. Exam Location:  [facility] Procedure: Limited Echo Indications:    Dyspnea 786.09 / R06.00  History:        Patient has prior history of Echocardiogram examinations, most                 recent 12/09/2019. Previous Myocardial Infarction,                 Arrythmias:Atrial Fibrillation; Risk Factors:Current Smoker and                 Hypertension. Rhabdomyolysis.  Sonographer:    Leavy Cella RDCS (AE) Referring Phys: [id number] [place]  1. Left ventricular ejection fraction, by estimation, is 40%. The left ventricle demonstrates global hypokinesis.  2. The inferior vena cava is normal in size with greater than 50% respiratory variability, suggesting right atrial pressure of 3 mmHg.  3. Limited echo to evaluate LV function. Echocontrast was used to enhance visualization. FINDINGS  Left Ventricle: Left ventricular ejection fraction, by estimation, is 40%. The left ventricle demonstrates global hypokinesis. Definity contrast agent was given IV to delineate the left ventricular endocardial borders. Pericardium: Trivial pericardial effusion is present. The pericardial effusion is circumferential. Venous: The inferior vena cava is normal in size with greater than 50% respiratory variability, suggesting right atrial pressure of 3 mmHg. Carlyle Dolly MD Electronically signed by Carlyle Dolly MD Signature Date/Time: 12/11/2019/3:12:49 PM    Final     Orson Eva, DO  [facility]  If 7PM-7AM, please contact night-coverage [email or web address] Password [email or web address] 12/16/2019, 9:05 AM   LOS: 7 days

## 2019-12-17 DIAGNOSIS — I4891 Unspecified atrial fibrillation: Secondary | ICD-10-CM

## 2019-12-17 LAB — COMPREHENSIVE METABOLIC PANEL
ALT: 17 U/L (ref 0–44)
AST: 23 U/L (ref 15–41)
Albumin: 2.7 g/dL — ABNORMAL LOW (ref 3.5–5.0)
Alkaline Phosphatase: 58 U/L (ref 38–126)
Anion gap: 6 (ref 5–15)
BUN: 41 mg/dL — ABNORMAL HIGH (ref 8–23)
CO2: 21 mmol/L — ABNORMAL LOW (ref 22–32)
Calcium: 9.5 mg/dL (ref 8.9–10.3)
Chloride: 103 mmol/L (ref 98–111)
Creatinine, Ser: 2.4 mg/dL — ABNORMAL HIGH (ref 0.61–1.24)
GFR calc Af Amer: 30 mL/min — ABNORMAL LOW (ref >60)
GFR calc non Af Amer: 26 mL/min — ABNORMAL LOW (ref >60)
Glucose, Bld: 89 mg/dL (ref 70–99)
Potassium: 3.8 mmol/L (ref 3.5–5.1)
Sodium: 130 mmol/L — ABNORMAL LOW (ref 135–145)
Total Bilirubin: 0.3 mg/dL (ref 0.3–1.2)
Total Protein: 5.4 g/dL — ABNORMAL LOW (ref 6.5–8.1)

## 2019-12-17 LAB — CBC WITH DIFFERENTIAL/PLATELET
Abs Immature Granulocytes: 0.03 10*3/uL (ref 0.00–0.07)
Basophils Absolute: 0.1 10*3/uL (ref 0.0–0.1)
Basophils Relative: 1 %
Eosinophils Absolute: 0.2 10*3/uL (ref 0.0–0.5)
Eosinophils Relative: 2 %
HCT: 26.5 % — ABNORMAL LOW (ref 39.0–52.0)
Hemoglobin: 8.5 g/dL — ABNORMAL LOW (ref 13.0–17.0)
Immature Granulocytes: 0 %
Lymphocytes Relative: 36 %
Lymphs Abs: 3 10*3/uL (ref 0.7–4.0)
MCH: 31.5 pg (ref 26.0–34.0)
MCHC: 32.1 g/dL (ref 30.0–36.0)
MCV: 98.1 fL (ref 80.0–100.0)
Monocytes Absolute: 0.8 10*3/uL (ref 0.1–1.0)
Monocytes Relative: 9 %
Neutro Abs: 4.3 10*3/uL (ref 1.7–7.7)
Neutrophils Relative %: 52 %
Platelets: 358 10*3/uL (ref 150–400)
RBC: 2.7 MIL/uL — ABNORMAL LOW (ref 4.22–5.81)
RDW: 14.3 % (ref 11.5–15.5)
WBC: 8.3 10*3/uL (ref 4.0–10.5)
nRBC: 0 % (ref 0.0–0.2)

## 2019-12-17 LAB — GLUCOSE, CAPILLARY
Glucose-Capillary: 82 mg/dL (ref 70–99)
Glucose-Capillary: 94 mg/dL (ref 70–99)
Glucose-Capillary: 92 mg/dL (ref 70–99)
Glucose-Capillary: 82 mg/dL (ref 70–99)

## 2019-12-17 LAB — MAGNESIUM: Magnesium: 2 mg/dL (ref 1.7–2.4)

## 2019-12-17 LAB — FERRITIN: Ferritin: 44 ng/mL (ref 24–336)

## 2019-12-17 LAB — C-REACTIVE PROTEIN: CRP: 0.7 mg/dL (ref <1.0)

## 2019-12-17 LAB — D-DIMER, QUANTITATIVE: D-Dimer, Quant: 4.3 ug/mL-FEU — ABNORMAL HIGH (ref 0.00–0.50)

## 2019-12-17 LAB — PHOSPHORUS: Phosphorus: 3 mg/dL (ref 2.5–4.6)

## 2019-12-17 MED ORDER — SODIUM BICARBONATE 650 MG PO TABS: 650.0000 mg | ORAL_TABLET | Freq: Two times a day (BID) | ORAL | Status: AC

## 2019-12-17 MED ORDER — HYDRALAZINE HCL 25 MG PO TABS: 37.5000 mg | ORAL_TABLET | Freq: Three times a day (TID) | ORAL | Status: DC

## 2019-12-17 MED ORDER — CARVEDILOL 3.125 MG PO TABS: 3.1250 mg | ORAL_TABLET | Freq: Two times a day (BID) | ORAL | Status: AC

## 2019-12-17 MED ORDER — APIXABAN 2.5 MG PO TABS: 2.5000 mg | ORAL_TABLET | Freq: Two times a day (BID) | ORAL | Status: AC

## 2019-12-17 MED ORDER — ISOSORBIDE MONONITRATE ER 30 MG PO TB24: 15.0000 mg | ORAL_TABLET | Freq: Every day | ORAL | Status: DC

## 2019-12-17 NOTE — Discharge Summary (Signed)
Physician Discharge Summary  Anthony Andrews GGE:[id number] DOB: 03/28/1946 DOA: 12/09/2019  PCP: [name], MD  Admit date: 12/09/2019 Discharge date: 12/17/2019  Admitted From: Home Disposition:  Home   Recommendations for Outpatient Follow-up:  1. Follow up with PCP in 1-2 weeks 2. Please obtain BMP/CBC in one week 3. Restart lasix 60 mg po daily on 12/20/19   Discharge Condition: Stable CODE STATUS: DNR Diet recommendation: Heart Healthy    Brief/Interim Summary: 74 year old male with a history of stroke with residual left hemiparesis, hypertension, tobacco abuse, COPD, paroxysmal atrial fibrillation, anemia of chronic disease, hyperlipidemia presenting with shortness of breath and wheezing with nonproductive cough for 1 day.  Apparently the patient was having chest discomfort at that time.  Upon EMS arrival, the patient's oxygen saturation was 90% on room air.  He was placed on 4 L.  Since admission, the patient was noted to have elevated troponin peaking at 4848.  The patient was started on IV heparin which has been since transition to oral apixaban.  Cardiology was consulted and followed the patient.  The patient was treated for acute on chronic systolic and diastolic CHF with IV furosemide.  He has improved clinically and has been since weaned off of oxygen.  Notably, the patient had already received his 2 doses of the COVID-19 vaccination with his last dose sometime in February 2021.  In addition, the patient was tested positive for COVID-19 in January 2021. Nevertheless, the patient had a negative SARS-CoV2 RT-PCR on 12/09/19.  As part of the screening process for SNF placement, he tested positive on SARS-CoV2 RT-PCR  Discharge Diagnoses:  Acute respiratory failure with hypoxia Secondary to CHF and pneumonia -Patient has been weaned to room air-stable on RA on day of dc  Acute on chronic systolic and diastolic CHF -4/65/0354 echo EF 40%, grade 2 DD, mild AS -Restart furosemide  60 mg p.o. daily -Daily weights - baseline weight119-125 lbs, admitted 132 lbs and BNP 3459 and CXR consistent with edema - standing weight down to 119 lbs this admission. -Restarting oral furosemide 3/24-->serum creatinine uptrend to 2.40 on 3/25 -clinically euvolemic on day of d/c -discussed with cardiology--hold lasix, restart on 12/20/19--60 mg po daily and repeat BMP 3-4 days after restart -continue coreg, hydralazine and imdur  Pulmonary infiltrate/pneumonia -Patient finished 7 days of ceftriaxone and doxycycline -afebrile, hemodynamically stable on RA on day of d/c  COVID-19 infection -12/09/2019 COVID-19 RT-PCR negative -12/16/2019 COVID-19 RT-PCR positive -Patient currently is essentially asymptomatic -No indication for steroids or remdesivir presently  Elevated troponin -Personally reviewed EKG--nonspecific ST-T wave changes -No chest pain presently -Appreciate cardiology follow-up--outpt cath once recovered from COVID-19 -Patient initially on IV heparin -Now transition to apixaban  Paroxysmal atrial fibrillation -Rate control -in sinus presently -Continue apixaban  Acute on chronic renal failure--CKD stage IIIa -Baseline creatinine 1.2-1.4 -serum creatinine 2.40 on day of d/c -hold lasix as above--restart lasix 60 mg daily on 12/20/19 with repeat BMP 3-4 days after restart  Anemia of chronic disease -No evidence of ongoing bleeding, PTA the patient was onEliquis, -restart Eliquis -Stool occult blood Negative -Hgb stable  history of second-degree AV block/history of tachyarrhythmia  -patient had a 30-day event monitor placed on 11/18/2019 -Preliminary review of his 30-day event monitor shows no significant arrhythmias -Continue Coreg,stopIV heparin for now -Restart Eliquis  Generalized Weakness/deconditioning- -PT eval appreciated recommends SNF rehab  Tobacco abuse- -smoking cessation strongly advised, patient is not ready to  quit   Discharge Instructions   Allergies as of 12/17/2019  No Known Allergies     Medication List    STOP taking these medications   metoprolol tartrate 25 MG tablet Commonly known as: LOPRESSOR     TAKE these medications   acetaminophen 325 MG tablet Commonly known as: TYLENOL Take 650 mg by mouth 3 (three) times daily.   albuterol 108 (90 Base) MCG/ACT inhaler Commonly known as: VENTOLIN HFA Inhale 2 puffs into the lungs every 4 (four) hours as needed for wheezing or shortness of breath.   apixaban 2.5 MG Tabs tablet Commonly known as: ELIQUIS Take 1 tablet (2.5 mg total) by mouth 2 (two) times daily. What changed:   medication strength  how much to take   aspirin EC 81 MG tablet Take 81 mg by mouth every morning.   carvedilol 3.125 MG tablet Commonly known as: COREG Take 1 tablet (3.125 mg total) by mouth 2 (two) times daily with a meal.   cholecalciferol 25 MCG (1000 UNIT) tablet Commonly known as: VITAMIN D3 Take 1,000 Units by mouth daily.   famotidine 20 MG tablet Commonly known as: PEPCID Take 20 mg by mouth daily.   hydrALAZINE 25 MG tablet Commonly known as: APRESOLINE Take 1.5 tablets (37.5 mg total) by mouth 3 (three) times daily.   isosorbide mononitrate 30 MG 24 hr tablet Commonly known as: IMDUR Take 0.5 tablets (15 mg total) by mouth daily. Start taking on: December 18, 2019   loratadine 10 MG tablet Commonly known as: CLARITIN Take 10 mg by mouth daily.   PARoxetine 20 MG tablet Commonly known as: PAXIL Take 20 mg by mouth daily.   rosuvastatin 10 MG tablet Commonly known as: CRESTOR Take 10 mg by mouth at bedtime.   sodium bicarbonate 650 MG tablet Take 1 tablet (650 mg total) by mouth 2 (two) times daily. What changed: when to take this   tamsulosin 0.4 MG Caps capsule Commonly known as: FLOMAX Take 0.4 mg by mouth daily.       Contact information for follow-up providers    Arnoldo Lenis, MD Follow up on  01/01/2020.   Specialty: Cardiology Why: at 3:00PM with his Nurse Practitioner Cecilie Kicks  [facility] [place].  Contact information: [place] [address] [phone]            Contact information for after-discharge care    Destination    [facility] .   Service: Skilled Nursing Contact information: [address] [place] [phone]                 No Known Allergies  Consultations:  cardiology   Procedures/Studies: DG Chest 2 View  Result Date: 12/15/2019 CLINICAL DATA:  Dyspnea, weakness, history of COVID-19 EXAM: CHEST - 2 VIEW COMPARISON:  12/11/2019 chest radiograph. FINDINGS: Stable metallic device in the ventral medial right chest wall. Fixation screw overlies the left glenoid. Stable cardiomediastinal silhouette with normal heart size. No pneumothorax. Small right pleural effusion is decreased. Resolved left pleural effusion. Patchy hazy and reticular opacities in both lungs appear improved with significantly improved aeration at the lung bases. IMPRESSION: 1. Significantly improved aeration at the lung bases with persistent mild patchy hazy and reticular opacities in both lungs, which could represent atypical pneumonia or nonspecific scarring. 2. Small right pleural effusion, decreased. Resolved left pleural effusion. Electronically Signed   By: Ilona Sorrel M.D.   On: 12/15/2019 10:53   DG Chest 2 View  Result Date: 12/11/2019 CLINICAL DATA:  Shortness of breath with atrial fibrillation and hypertension EXAM: CHEST - 2 VIEW COMPARISON:  December 09, 2019 chest radiograph and chest CT September 15, 2020 FINDINGS: Underlying emphysematous change is noted, most severe in the right upper lobe. There is a right pleural effusion with airspace opacity in the right lower lobe. There is atelectatic change in the left base. Heart is mildly enlarged. There is mild interstitial edema. Heart is  slightly enlarged with pulmonary vascularity reflecting the underlying emphysema. There is aortic atherosclerosis. There is a loop recorder anteriorly. There is postoperative change in the left medial shoulder region. IMPRESSION: Findings most consistent with congestive heart failure superimposed on emphysematous change. Cardiomegaly is present with interstitial edema. Right pleural effusion. There is suspected superimposed right lower lobe pneumonia. There is aortic atherosclerosis. Aortic Atherosclerosis (ICD10-I70.0) and Emphysema (ICD10-J43.9). Electronically Signed   By: Lowella Grip III M.D.   On: 12/11/2019 11:14   DG Chest Portable 1 View  Result Date: 12/09/2019 CLINICAL DATA:  EMS reports pt is a residen at [facility]. Reports sob and cp since last night. Per Pts chart Pt has had COVID - unsure when. Hx A-fib, stroke, smoker EXAM: PORTABLE CHEST 1 VIEW COMPARISON:  09/15/2019 FINDINGS: Lungs density interstitial hazy airspace lung opacities, with more confluent opacity in the right lung base, obscuring the hemidiaphragm. Cardiac silhouette is normal in size.  Mediastinal hilar masses. Probable pleural effusions, larger on the right.  No pneumothorax. IMPRESSION: 1. Interstitial and hazy airspace lung opacities bilaterally with more confluent opacity noted in the right lung base. Findings may reflect multifocal pneumonia or pulmonary edema. Suspect right lung base opacity due to a combination of pleural fluid with either atelectasis or more confluent infection. Electronically Signed   By: Lajean Manes M.D.   On: 12/09/2019 11:34   ECHOCARDIOGRAM COMPLETE  Result Date: 12/09/2019    ECHOCARDIOGRAM REPORT   Patient Name:   TAIJUAN SERVISS Date of Exam: 12/09/2019 Medical Rec #:  [id number]   Height:       63.0 in Accession #:    [id number]  Weight:       125.0 lb Date of Birth:  20-Aug-1946  BSA:          1.584 m Patient Age:    89 years    BP:           140/78 mmHg Patient Gender: M           HR:            90 bpm. Exam Location:  [facility] Procedure: 2D Echo Indications:    Dyspnea 786.09 / R06.00  History:        Patient has prior history of Echocardiogram examinations, most                 recent 09/17/2019. Stroke, Arrythmias:Atrial Fibrillation; Risk                 Factors:Current Smoker. Rhabdomyolysis, Pneumonia.  Sonographer:    Leavy Cella RDCS (AE) Referring Phys: [id number] [place] IMPRESSIONS  1. Left ventricular ejection fraction, by estimation, is 40%. The left ventricle has moderately decreased function. The left ventricle demonstrates global hypokinesis. Left ventricular diastolic parameters are consistent with Grade II diastolic dysfunction (pseudonormalization).  2. Right ventricular systolic function is normal. The right ventricular size is normal. There is moderately elevated pulmonary artery systolic pressure.  3. The mitral valve is grossly normal. Mild mitral valve regurgitation.  4. Morphologically, there appears to be mild  aortic stenosis. The aortic valve is tricuspid. Aortic valve regurgitation is not visualized.  5. The inferior vena cava is normal in size with greater than 50% respiratory variability, suggesting right atrial pressure of 3 mmHg. FINDINGS  Left Ventricle: Left ventricular ejection fraction, by estimation, is 40%. The left ventricle has moderately decreased function. The left ventricle demonstrates global hypokinesis. The left ventricular internal cavity size was normal in size. There is no left ventricular hypertrophy. Left ventricular diastolic parameters are consistent with Grade II diastolic dysfunction (pseudonormalization). Indeterminate filling pressures. Right Ventricle: The right ventricular size is normal. No increase in right ventricular wall thickness. Right ventricular systolic function is normal. There is moderately elevated pulmonary artery systolic pressure. The tricuspid regurgitant velocity is 3.08 m/s, and with an assumed right atrial  pressure of 10 mmHg, the estimated right ventricular systolic pressure is 24.0 mmHg. Left Atrium: Left atrial size was normal in size. Right Atrium: Right atrial size was normal in size. Pericardium: There is no evidence of pericardial effusion. Mitral Valve: The mitral valve is grossly normal. Mild mitral valve regurgitation. Tricuspid Valve: The tricuspid valve is grossly normal. Tricuspid valve regurgitation is mild. Aortic Valve: Morphologically, there appears to be mild aortic stenosis. The aortic valve is tricuspid. . There is mild thickening and mild calcification of the aortic valve. Aortic valve regurgitation is not visualized. Mild aortic valve annular calcification. There is mild thickening of the aortic valve. There is mild calcification of the aortic valve. Pulmonic Valve: The pulmonic valve was grossly normal. Pulmonic valve regurgitation is not visualized. Aorta: The aortic root is normal in size and structure. Venous: The inferior vena cava is normal in size with greater than 50% respiratory variability, suggesting right atrial pressure of 3 mmHg. IAS/Shunts: No atrial level shunt detected by color flow Doppler.  LEFT VENTRICLE PLAX 2D LVIDd:         4.76 cm  Diastology LVIDs:         4.10 cm  LV e' lateral:   6.96 cm/s LV PW:         0.84 cm  LV E/e' lateral: 9.4 LV IVS:        0.79 cm  LV e' medial:    5.98 cm/s LVOT diam:     2.10 cm  LV E/e' medial:  11.0 LVOT Area:     3.46 cm  RIGHT VENTRICLE RV S prime:     11.60 cm/s TAPSE (M-mode): 1.7 cm LEFT ATRIUM             Index       RIGHT ATRIUM           Index LA diam:        4.00 cm 2.53 cm/m  RA Area:     13.90 cm LA Vol (A2C):   74.9 ml 47.30 ml/m RA Volume:   37.30 ml  23.55 ml/m LA Vol (A4C):   42.6 ml 26.90 ml/m LA Biplane Vol: 56.1 ml 35.43 ml/m   AORTA Ao Root diam: 2.80 cm MITRAL VALVE               TRICUSPID VALVE MV Area (PHT): 2.91 cm    TR Peak grad:   37.9 mmHg MV Decel Time: 261 msec    TR Vmax:        308.00 cm/s MR Peak  grad: 42.2 mmHg MR Mean grad: 29.0 mmHg    SHUNTS MR Vmax:      325.00 cm/s  Systemic Diam: 2.10 cm  MR Vmean:     254.0 cm/s MV E velocity: 65.50 cm/s MV A velocity: 41.70 cm/s MV E/A ratio:  1.57 Kate Sable MD Electronically signed by Kate Sable MD Signature Date/Time: 12/09/2019/3:20:59 PM    Final    ECHOCARDIOGRAM LIMITED  Result Date: 12/11/2019    ECHOCARDIOGRAM LIMITED REPORT   Patient Name:   BRODEY BONN Date of Exam: 12/11/2019 Medical Rec #:  [id number]   Height:       63.0 in Accession #:    [id number]  Weight:       119.6 lb Date of Birth:  1946-01-21  BSA:          1.554 m Patient Age:    20 years    BP:           114/81 mmHg Patient Gender: M           HR:           73 bpm. Exam Location:  [facility] Procedure: Limited Echo Indications:    Dyspnea 786.09 / R06.00  History:        Patient has prior history of Echocardiogram examinations, most                 recent 12/09/2019. Previous Myocardial Infarction,                 Arrythmias:Atrial Fibrillation; Risk Factors:Current Smoker and                 Hypertension. Rhabdomyolysis.  Sonographer:    Leavy Cella RDCS (AE) Referring Phys: [id number] [place]  1. Left ventricular ejection fraction, by estimation, is 40%. The left ventricle demonstrates global hypokinesis.  2. The inferior vena cava is normal in size with greater than 50% respiratory variability, suggesting right atrial pressure of 3 mmHg.  3. Limited echo to evaluate LV function. Echocontrast was used to enhance visualization. FINDINGS  Left Ventricle: Left ventricular ejection fraction, by estimation, is 40%. The left ventricle demonstrates global hypokinesis. Definity contrast agent was given IV to delineate the left ventricular endocardial borders. Pericardium: Trivial pericardial effusion is present. The pericardial effusion is circumferential. Venous: The inferior vena cava is normal in size with greater than 50% respiratory variability, suggesting  right atrial pressure of 3 mmHg. Carlyle Dolly MD Electronically signed by Carlyle Dolly MD Signature Date/Time: 12/11/2019/3:12:49 PM    Final         Discharge Exam: Vitals:   12/16/19 2047 12/17/19 0526  BP: 114/63 123/73  Pulse: 72 68  Resp: 20 16  Temp: (!) 97.5 F (36.4 C) 98 F (36.7 C)  SpO2: 98% 98%   Vitals:   12/16/19 1435 12/16/19 1723 12/16/19 2047 12/17/19 0526  BP: (!) 98/55 114/65 114/63 123/73  Pulse: 83 72 72 68  Resp: 18  20 16   Temp:   (!) 97.5 F (36.4 C) 98 F (36.7 C)  TempSrc:   Oral Oral  SpO2: 99%  98% 98%  Weight:      Height:        General: Pt is alert, awake, not in acute distress Cardiovascular: RRR, S1/S2 +, no rubs, no gallops Respiratory: bibasilar crackles. No wheeze Abdominal: Soft, NT, ND, bowel sounds + Extremities: no edema, no cyanosis   The results of significant diagnostics from this hospitalization (including imaging, microbiology, ancillary and laboratory) are listed below for reference.    Significant Diagnostic Studies: DG Chest 2 View  Result Date: 12/15/2019 CLINICAL DATA:  Dyspnea,  weakness, history of COVID-19 EXAM: CHEST - 2 VIEW COMPARISON:  12/11/2019 chest radiograph. FINDINGS: Stable metallic device in the ventral medial right chest wall. Fixation screw overlies the left glenoid. Stable cardiomediastinal silhouette with normal heart size. No pneumothorax. Small right pleural effusion is decreased. Resolved left pleural effusion. Patchy hazy and reticular opacities in both lungs appear improved with significantly improved aeration at the lung bases. IMPRESSION: 1. Significantly improved aeration at the lung bases with persistent mild patchy hazy and reticular opacities in both lungs, which could represent atypical pneumonia or nonspecific scarring. 2. Small right pleural effusion, decreased. Resolved left pleural effusion. Electronically Signed   By: Ilona Sorrel M.D.   On: 12/15/2019 10:53   DG Chest 2  View  Result Date: 12/11/2019 CLINICAL DATA:  Shortness of breath with atrial fibrillation and hypertension EXAM: CHEST - 2 VIEW COMPARISON:  December 09, 2019 chest radiograph and chest CT September 15, 2020 FINDINGS: Underlying emphysematous change is noted, most severe in the right upper lobe. There is a right pleural effusion with airspace opacity in the right lower lobe. There is atelectatic change in the left base. Heart is mildly enlarged. There is mild interstitial edema. Heart is slightly enlarged with pulmonary vascularity reflecting the underlying emphysema. There is aortic atherosclerosis. There is a loop recorder anteriorly. There is postoperative change in the left medial shoulder region. IMPRESSION: Findings most consistent with congestive heart failure superimposed on emphysematous change. Cardiomegaly is present with interstitial edema. Right pleural effusion. There is suspected superimposed right lower lobe pneumonia. There is aortic atherosclerosis. Aortic Atherosclerosis (ICD10-I70.0) and Emphysema (ICD10-J43.9). Electronically Signed   By: Lowella Grip III M.D.   On: 12/11/2019 11:14   DG Chest Portable 1 View  Result Date: 12/09/2019 CLINICAL DATA:  EMS reports pt is a residen at [facility]. Reports sob and cp since last night. Per Pts chart Pt has had COVID - unsure when. Hx A-fib, stroke, smoker EXAM: PORTABLE CHEST 1 VIEW COMPARISON:  09/15/2019 FINDINGS: Lungs density interstitial hazy airspace lung opacities, with more confluent opacity in the right lung base, obscuring the hemidiaphragm. Cardiac silhouette is normal in size.  Mediastinal hilar masses. Probable pleural effusions, larger on the right.  No pneumothorax. IMPRESSION: 1. Interstitial and hazy airspace lung opacities bilaterally with more confluent opacity noted in the right lung base. Findings may reflect multifocal pneumonia or pulmonary edema. Suspect right lung base opacity due to a combination of pleural fluid with  either atelectasis or more confluent infection. Electronically Signed   By: Lajean Manes M.D.   On: 12/09/2019 11:34   ECHOCARDIOGRAM COMPLETE  Result Date: 12/09/2019    ECHOCARDIOGRAM REPORT   Patient Name:   GOTTI ALWIN Date of Exam: 12/09/2019 Medical Rec #:  [id number]   Height:       63.0 in Accession #:    [id number]  Weight:       125.0 lb Date of Birth:  [birth year]  BSA:          1.584 m Patient Age:    7 years    BP:           140/78 mmHg Patient Gender: M           HR:           90 bpm. [facility]am Location:  [facility] Procedure: 2D Echo Indications:    Dyspnea 786.09 / R06.00  History:        Patient has prior history of Echocardiogram examinations, most  recent 09/17/2019. Stroke, Arrythmias:Atrial Fibrillation; Risk                 Factors:Current Smoker. Rhabdomyolysis, Pneumonia.  Sonographer:    Leavy Cella RDCS (AE) Referring Phys: [id number] COUR[place] IMPRESSIONS  1. Left ventricular ejection fraction, by estimation, is 40%. The left ventricle has moderately decreased function. The left ventricle demonstrates global hypokinesis. Left ventricular diastolic parameters are consistent with Grade II diastolic dysfunction (pseudonormalization).  2. Right ventricular systolic function is normal. The right ventricular size is normal. There is moderately elevated pulmonary artery systolic pressure.  3. The mitral valve is grossly normal. Mild mitral valve regurgitation.  4. Morphologically, there appears to be mild aortic stenosis. The aortic valve is tricuspid. Aortic valve regurgitation is not visualized.  5. The inferior vena cava is normal in size with greater than 50% respiratory variability, suggesting right atrial pressure of 3 mmHg. FINDINGS  Left Ventricle: Left ventricular ejection fraction, by estimation, is 40%. The left ventricle has moderately decreased function. The left ventricle demonstrates global hypokinesis. The left ventricular internal cavity size was normal in  size. There is no left ventricular hypertrophy. Left ventricular diastolic parameters are consistent with Grade II diastolic dysfunction (pseudonormalization). Indeterminate filling pressures. Right Ventricle: The right ventricular size is normal. No increase in right ventricular wall thickness. Right ventricular systolic function is normal. There is moderately elevated pulmonary artery systolic pressure. The tricuspid regurgitant velocity is 3.08 m/s, and with an assumed right atrial pressure of 10 mmHg, the estimated right ventricular systolic pressure is 21.3 mmHg. Left Atrium: Left atrial size was normal in size. Right Atrium: Right atrial size was normal in size. Pericardium: There is no evidence of pericardial effusion. Mitral Valve: The mitral valve is grossly normal. Mild mitral valve regurgitation. Tricuspid Valve: The tricuspid valve is grossly normal. Tricuspid valve regurgitation is mild. Aortic Valve: Morphologically, there appears to be mild aortic stenosis. The aortic valve is tricuspid. . There is mild thickening and mild calcification of the aortic valve. Aortic valve regurgitation is not visualized. Mild aortic valve annular calcification. There is mild thickening of the aortic valve. There is mild calcification of the aortic valve. Pulmonic Valve: The pulmonic valve was grossly normal. Pulmonic valve regurgitation is not visualized. Aorta: The aortic root is normal in size and structure. Venous: The inferior vena cava is normal in size with greater than 50% respiratory variability, suggesting right atrial pressure of 3 mmHg. IAS/Shunts: No atrial level shunt detected by color flow Doppler.  LEFT VENTRICLE PLAX 2D LVIDd:         4.76 cm  Diastology LVIDs:         4.10 cm  LV e' lateral:   6.96 cm/s LV PW:         0.84 cm  LV E/e' lateral: 9.4 LV IVS:        0.79 cm  LV e' medial:    5.98 cm/s LVOT diam:     2.10 cm  LV E/e' medial:  11.0 LVOT Area:     3.46 cm  RIGHT VENTRICLE RV S prime:      11.60 cm/s TAPSE (M-mode): 1.7 cm LEFT ATRIUM             Index       RIGHT ATRIUM           Index LA diam:        4.00 cm 2.53 cm/m  RA Area:     13.90 cm LA Vol (A2C):  74.9 ml 47.30 ml/m RA Volume:   37.30 ml  23.55 ml/m LA Vol (A4C):   42.6 ml 26.90 ml/m LA Biplane Vol: 56.1 ml 35.43 ml/m   AORTA Ao Root diam: 2.80 cm MITRAL VALVE               TRICUSPID VALVE MV Area (PHT): 2.91 cm    TR Peak grad:   37.9 mmHg MV Decel Time: 261 msec    TR Vmax:        308.00 cm/s MR Peak grad: 42.2 mmHg MR Mean grad: 29.0 mmHg    SHUNTS MR Vmax:      325.00 cm/s  Systemic Diam: 2.10 cm MR Vmean:     254.0 cm/s MV E velocity: 65.50 cm/s MV A velocity: 41.70 cm/s MV E/A ratio:  1.57 Kate Sable MD Electronically signed by Kate Sable MD Signature Date/Time: 12/09/2019/3:20:59 PM    Final    ECHOCARDIOGRAM LIMITED  Result Date: 12/11/2019    ECHOCARDIOGRAM LIMITED REPORT   Patient Name:   JAIRE PINKHAM Date of Exam: 12/11/2019 Medical Rec #:  [id number]   Height:       63.0 in Accession #:    [id number]  Weight:       119.6 lb Date of Birth:  [birth year]  BSA:          1.554 m Patient Age:    89 years    BP:           114/81 mmHg Patient Gender: M           HR:           73 bpm. Exam Location:  [facility] Procedure: Limited Echo Indications:    Dyspnea 786.09 / R06.00  History:        Patient has prior history of Echocardiogram examinations, most                 recent 12/09/2019. Previous Myocardial Infarction,                 Arrythmias:Atrial Fibrillation; Risk Factors:Current Smoker and                 Hypertension. Rhabdomyolysis.  Sonographer:    Leavy Cella RDCS (AE) Referring Phys: [id number] [place]  1. Left ventricular ejection fraction, by estimation, is 40%. The left ventricle demonstrates global hypokinesis.  2. The inferior vena cava is normal in size with greater than 50% respiratory variability, suggesting right atrial pressure of 3 mmHg.  3. Limited echo to evaluate LV  function. Echocontrast was used to enhance visualization. FINDINGS  Left Ventricle: Left ventricular ejection fraction, by estimation, is 40%. The left ventricle demonstrates global hypokinesis. Definity contrast agent was given IV to delineate the left ventricular endocardial borders. Pericardium: Trivial pericardial effusion is present. The pericardial effusion is circumferential. Venous: The inferior vena cava is normal in size with greater than 50% respiratory variability, suggesting right atrial pressure of 3 mmHg. Carlyle Dolly MD Electronically signed by Carlyle Dolly MD Signature Date/Time: 12/11/2019/3:12:49 PM    Final      Microbiology: Recent Results (from the past 240 hour(s))  Respiratory Panel by RT PCR (Flu A&B, Covid) - Nasopharyngeal Swab     Status: None   Collection Time: 12/09/19  2:21 PM   Specimen: Nasopharyngeal Swab  Result Value Ref Range Status   SARS Coronavirus 2 by RT PCR NEGATIVE NEGATIVE Final    Comment: (NOTE) SARS-CoV-2 target nucleic acids are NOT  DETECTED. The SARS-CoV-2 RNA is generally detectable in upper respiratoy specimens during the acute phase of infection. The lowest concentration of SARS-CoV-2 viral copies this assay can detect is 131 copies/mL. A negative result does not preclude SARS-Cov-2 infection and should not be used as the sole basis for treatment or other patient management decisions. A negative result may occur with  improper specimen collection/handling, submission of specimen other than nasopharyngeal swab, presence of viral mutation(s) within the areas targeted by this assay, and inadequate number of viral copies (<131 copies/mL). A negative result must be combined with clinical observations, patient history, and epidemiological information. The expected result is Negative. Fact Sheet for Patients:  PinkCheek.be Fact Sheet for Healthcare Providers:  GravelBags.it This  test is not yet ap proved or cleared by the [place] FDA and  has been authorized for detection and/or diagnosis of SARS-CoV-2 by FDA under an Emergency Use Authorization (EUA). This EUA will remain  in effect (meaning this test can be used) for the duration of the COVID-19 declaration under Section 564(b)(1) of the Act, 21 U.S.C. section 360bbb-3(b)(1), unless the authorization is terminated or revoked sooner.    Influenza A by PCR NEGATIVE NEGATIVE Final   Influenza B by PCR NEGATIVE NEGATIVE Final    Comment: (NOTE) The Xpert Xpress SARS-CoV-2/FLU/RSV assay is intended as an aid in  the diagnosis of influenza from Nasopharyngeal swab specimens and  should not be used as a sole basis for treatment. Nasal washings and  aspirates are unacceptable for Xpert Xpress SARS-CoV-2/FLU/RSV  testing. Fact Sheet for Patients: PinkCheek.be Fact Sheet for Healthcare Providers: GravelBags.it This test is not yet approved or cleared by the [place] FDA and  has been authorized for detection and/or diagnosis of SARS-CoV-2 by  FDA under an Emergency Use Authorization (EUA). This EUA will remain  in effect (meaning this test can be used) for the duration of the  Covid-19 declaration under Section 564(b)(1) of the Act, 21  U.S.C. section 360bbb-3(b)(1), unless the authorization is  terminated or revoked. Performed at [facility], [address], [place], [place] [address]   Respiratory Panel by RT PCR (Flu A&B, Covid) - Nasopharyngeal Swab     Status: Abnormal   Collection Time: 12/15/19 11:15 AM   Specimen: Nasopharyngeal Swab  Result Value Ref Range Status   SARS Coronavirus 2 by RT PCR POSITIVE (A) NEGATIVE Final    Comment: RESULT CALLED TO, READ BACK BY AND VERIFIED WITH: BARKER B. AT 1254 ON [id number] BY THOMPSON S. (NOTE) SARS-CoV-2 target nucleic acids are DETECTED. SARS-CoV-2 RNA is generally detectable in upper respiratory  specimens  during the acute phase of infection. Positive results are indicative of the presence of the identified virus, but do not rule out bacterial infection or co-infection with other pathogens not detected by the test. Clinical correlation with patient history and other diagnostic information is necessary to determine patient infection status. The expected result is Negative. Fact Sheet for Patients:  PinkCheek.be Fact Sheet for Healthcare Providers: GravelBags.it This test is not yet approved or cleared by the [place] FDA and  has been authorized for detection and/or diagnosis of SARS-CoV-2 by FDA under an Emergency Use Authorization (EUA).  This EUA will remain in effect (meaning this test can be  used) for the duration of  the COVID-19 declaration under Section 564(b)(1) of the Act, 21 U.S.C. section 360bbb-3(b)(1), unless the authorization is terminated or revoked sooner.    Influenza A by PCR NEGATIVE NEGATIVE Final   Influenza  B by PCR NEGATIVE NEGATIVE Final    Comment: (NOTE) The Xpert Xpress SARS-CoV-2/FLU/RSV assay is intended as an aid in  the diagnosis of influenza from Nasopharyngeal swab specimens and  should not be used as a sole basis for treatment. Nasal washings and  aspirates are unacceptable for Xpert Xpress SARS-CoV-2/FLU/RSV  testing. Fact Sheet for Patients: PinkCheek.be Fact Sheet for Healthcare Providers: GravelBags.it This test is not yet approved or cleared by the [place] FDA and  has been authorized for detection and/or diagnosis of SARS-CoV-2 by  FDA under an Emergency Use Authorization (EUA). This EUA will remain  in effect (meaning this test can be used) for the duration of the  Covid-19 declaration under Section 564(b)(1) of the Act, 21  U.S.C. section 360bbb-3(b)(1), unless the authorization is  terminated or  revoked. Performed at [facility], [address], [place],  [address]      Labs: Basic Metabolic Panel: Recent Labs  Lab 12/12/19 0404 12/12/19 0404 12/13/19 7408 12/13/19 1448 12/14/19 0445 12/14/19 0445 12/15/19 0832 12/15/19 0832 12/16/19 0517 12/17/19 0547  NA 135   134*   < > 132*  --  133*  --  132*  --  130* 130*  K 4.8   4.7   < > 4.4   < > 4.1   < > 3.9   < > 4.6 3.8  CL 104   104   < > 104  --  105  --  106  --  103 103  CO2 21*   21*   < > 22  --  20*  --  20*  --  18* 21*  GLUCOSE 78   77   < > 85  --  86  --  89  --  117* 89  BUN 42*   43*   < > 36*  --  35*  --  29*  --  35* 41*  CREATININE 2.44*   2.43*   < > 2.26*  --  2.32*  --  1.97*  --  2.17* 2.40*  CALCIUM 9.3   9.2   < > 9.0  --  9.1  --  9.2  --  9.4 9.5  MG  --   --   --   --   --   --   --   --  2.1 2.0  PHOS 2.9  --   --   --  2.7  --   --   --  3.7 3.0   < > = values in this interval not displayed.   Liver Function Tests: Recent Labs  Lab 12/12/19 0404 12/14/19 0445 12/16/19 0517 12/17/19 0547  AST  --   --  18 23  ALT  --   --  16 17  ALKPHOS  --   --  64 58  BILITOT  --   --  0.5 0.3  PROT  --   --  5.9* 5.4*  ALBUMIN 2.6* 2.6* 2.6* 2.7*   No results for input(s): LIPASE, AMYLASE in the last 168 hours. No results for input(s): AMMONIA in the last 168 hours. CBC: Recent Labs  Lab 12/13/19 0412 12/14/19 0445 12/15/19 0437 12/16/19 0517 12/17/19 0547  WBC 7.5 7.0 6.7 4.3 8.3  NEUTROABS  --   --   --  3.2 4.3  HGB 8.4* 8.9* 8.8* 9.1* 8.5*  HCT 26.7* 26.4* 27.3* 28.9* 26.5*  MCV 97.4 97.8 98.9 97.3 98.1  PLT 416* 442* 376  394 358   Cardiac Enzymes: No results for input(s): CKTOTAL, CKMB, CKMBINDEX, TROPONINI in the last 168 hours. BNP: Invalid input(s): POCBNP CBG: Recent Labs  Lab 12/16/19 0753 12/16/19 1110 12/16/19 1713 12/16/19 2043 12/17/19 0740  GLUCAP 104* 109* 97 100* 82    Time coordinating discharge:  36 minutes  Signed:  Orson Eva, DO [facility] Pager: [phone] 12/17/2019, 11:17 AM

## 2019-12-17 NOTE — Plan of Care (Signed)

## 2019-12-18 LAB — CBC WITH DIFFERENTIAL/PLATELET
Abs Immature Granulocytes: 0.03 10*3/uL (ref 0.00–0.07)
Basophils Absolute: 0.1 10*3/uL (ref 0.0–0.1)
Basophils Relative: 1 %
Eosinophils Absolute: 0.4 10*3/uL (ref 0.0–0.5)
Eosinophils Relative: 4 %
HCT: 27.3 % — ABNORMAL LOW (ref 39.0–52.0)
Hemoglobin: 8.9 g/dL — ABNORMAL LOW (ref 13.0–17.0)
Immature Granulocytes: 0 %
Lymphocytes Relative: 31 %
Lymphs Abs: 3 10*3/uL (ref 0.7–4.0)
MCH: 31.6 pg (ref 26.0–34.0)
MCHC: 32.6 g/dL (ref 30.0–36.0)
MCV: 96.8 fL (ref 80.0–100.0)
Monocytes Absolute: 0.9 10*3/uL (ref 0.1–1.0)
Monocytes Relative: 10 %
Neutro Abs: 5.2 10*3/uL (ref 1.7–7.7)
Neutrophils Relative %: 54 %
Platelets: 353 10*3/uL (ref 150–400)
RBC: 2.82 MIL/uL — ABNORMAL LOW (ref 4.22–5.81)
RDW: 14.1 % (ref 11.5–15.5)
WBC: 9.6 10*3/uL (ref 4.0–10.5)
nRBC: 0 % (ref 0.0–0.2)

## 2019-12-18 LAB — COMPREHENSIVE METABOLIC PANEL
ALT: 16 U/L (ref 0–44)
AST: 18 U/L (ref 15–41)
Albumin: 2.7 g/dL — ABNORMAL LOW (ref 3.5–5.0)
Alkaline Phosphatase: 58 U/L (ref 38–126)
Anion gap: 6 (ref 5–15)
BUN: 43 mg/dL — ABNORMAL HIGH (ref 8–23)
CO2: 23 mmol/L (ref 22–32)
Calcium: 9.6 mg/dL (ref 8.9–10.3)
Chloride: 101 mmol/L (ref 98–111)
Creatinine, Ser: 2.14 mg/dL — ABNORMAL HIGH (ref 0.61–1.24)
GFR calc Af Amer: 35 mL/min — ABNORMAL LOW (ref >60)
GFR calc non Af Amer: 30 mL/min — ABNORMAL LOW (ref >60)
Glucose, Bld: 86 mg/dL (ref 70–99)
Potassium: 4.1 mmol/L (ref 3.5–5.1)
Sodium: 130 mmol/L — ABNORMAL LOW (ref 135–145)
Total Bilirubin: 0.4 mg/dL (ref 0.3–1.2)
Total Protein: 5.5 g/dL — ABNORMAL LOW (ref 6.5–8.1)

## 2019-12-18 LAB — PHOSPHORUS: Phosphorus: 2.8 mg/dL (ref 2.5–4.6)

## 2019-12-18 LAB — FERRITIN: Ferritin: 44 ng/mL (ref 24–336)

## 2019-12-18 LAB — GLUCOSE, CAPILLARY
Glucose-Capillary: 76 mg/dL (ref 70–99)
Glucose-Capillary: 81 mg/dL (ref 70–99)
Glucose-Capillary: 87 mg/dL (ref 70–99)
Glucose-Capillary: 81 mg/dL (ref 70–99)

## 2019-12-18 LAB — MAGNESIUM: Magnesium: 2 mg/dL (ref 1.7–2.4)

## 2019-12-18 LAB — C-REACTIVE PROTEIN: CRP: 0.6 mg/dL (ref <1.0)

## 2019-12-18 LAB — D-DIMER, QUANTITATIVE: D-Dimer, Quant: 5.05 ug/mL-FEU — ABNORMAL HIGH (ref 0.00–0.50)

## 2019-12-18 MED ORDER — FUROSEMIDE 20 MG PO TABS: 60.0000 mg | ORAL_TABLET | Freq: Every day | ORAL | 1 refills | Status: DC

## 2019-12-18 NOTE — Care Management Important Message (Signed)
Important Message  Patient Details  Name: Anthony Andrews MRN: [id number] Date of Birth: [birth year]   Medicare Important Message Given:  Yes(Given by Mardene Celeste, RN due to precautions)     [name] 12/18/2019, 1:20 PM

## 2019-12-18 NOTE — Progress Notes (Signed)
CSW spoke with Jacqualin Combes several times today to get collateral information to complete the patient's MSE required for his PASRR number. Pamala Hurry called CSW she completed the MSE. PASRR number is still pending. CSW called [facility] and spoke with Helene Kelp. Helene Kelp confirmed [facility] will not accept the patient without a new PASRR number. Physician and TOC notified of pending PASRR number.  Tobi Bastos, LCSW Transitions of Care Clinical Social Worker [facility] Ph: [phone]

## 2019-12-18 NOTE — Progress Notes (Deleted)
PASRR received: [id number] Nigel Sloop, LCSW Transitions of Care Clinical Social Worker [facility] Ph: [phone]

## 2019-12-18 NOTE — Discharge Summary (Addendum)
Physician Discharge Summary  Anthony Andrews XBJ:[id number] DOB: 1946-03-08 DOA: 12/09/2019  PCP: [name], MD  Admit date: 12/09/2019 Discharge date: 12/18/2019  Admitted From: Home Disposition:  Home  Recommendations for Outpatient Follow-up:  1. Follow up with PCP in 1-2 weeks 2. Please restart lasix 60 mg po daily on 12/19/19 3. Please obtain BMP on 12/22/19     Discharge Condition: Stable CODE STATUS: DNR Diet recommendation: Heart Healthy   Brief/Interim Summary: 74 year old male with a history of stroke with residual left hemiparesis, hypertension, tobacco abuse, COPD, paroxysmal atrial fibrillation, anemia of chronic disease, hyperlipidemia presenting with shortness of breath and wheezing with nonproductive cough for 1 day. Apparently the patient was having chest discomfort at that time. Upon EMS arrival, the patient's oxygen saturation was 90% on room air. He was placed on 4 L. Since admission, the patient was noted to have elevated troponin peaking at 4848. The patient was started on IV heparin which has been since transition to oral apixaban. Cardiology was consulted and followed the patient. The patient was treated for acute on chronic systolic and diastolic CHF with IV furosemide. He has improved clinically and has been since weaned off of oxygen. Notably, the patient had already received his 2 doses of the COVID-19 vaccination with his last dose sometime in February 2021. In addition, the patient was tested positive for COVID-19 in January 2021. Nevertheless, the patient had a negativeSARS-CoV2 RT-PCR on 12/09/19. As part of the screening process for SNF placement, he tested positive on SARS-CoV2 RT-PCR.  Fortunately, the patient remained essentially asymptomatic and stable on RA.  Unfortunately, his discharge was delayed until 12/18/19 due to social work issue.  Discharge Diagnoses:  Acute respiratory failure with hypoxia Secondary to CHF and pneumonia -Patient has  been weaned to room air-stable on RA on day of dc  Acute on chronic systolic and diastolic CHF -11/29/6576 echo EF 40%, grade 2 DD, mild AS -Restart furosemide 60 mg p.o. daily -Daily weights - baseline weight119-125 lbs, admitted 132 lbs and BNP 3459 and CXR consistent with edema - standing weight down to 119 lbs this admission. -Restarting oral furosemide3/24-->serum creatinine uptrend to 2.40 on 3/25 -clinically euvolemic on day of d/c -discussed with cardiology--hold lasix, restart on 12/19/19--60 mg po daily and repeat BMP 3 days after restart on 12/22/19 -continue coreg, hydralazine and imdur  Pulmonary infiltrate/pneumonia -Patient finished 7 days of ceftriaxone and doxycycline -afebrile, hemodynamically stable on RA on day of d/c  COVID-19 infection -12/09/2019 COVID-19 RT-PCR negative -12/16/2019 COVID-19 RT-PCR positive -Patient currently is essentially asymptomatic and stable on RA -No indication for steroids or remdesivir presently  Elevated troponin -Personally reviewed EKG--nonspecific ST-T wave changes -No chest pain presently -Appreciate cardiology follow-up--outpt cath once recovered from COVID-19 -Patient initially on IV heparin -Now transition to apixaban  Paroxysmal atrial fibrillation -Rate control -in sinus presently -Continue apixaban  Acute on chronic renal failure--CKD stage IIIa -Baseline creatinine 1.2-1.4 -serum creatinine 2.40 on day of d/c -hold lasix as above--restart lasix 60 mg daily on 12/20/19 with repeat BMP 3-4 days after restart  Anemia of chronic disease -No evidence of ongoing bleeding, PTA the patient was onEliquis, -restart Eliquis -Stool occult blood Negative -Hgb stable -baseline Hgb 8-9  history of second-degree AV block/history of tachyarrhythmia  -patient had a 30-day event monitor placed on 11/18/2019 -Preliminary review of his 30-day event monitor shows no significant arrhythmias -Continue Coreg,stopIV heparin  for now -Restart Eliquis  Generalized Weakness/deconditioning- -PT eval appreciated recommends SNF rehab  Tobacco abuse- -smoking  cessation strongly advised, patient is not ready to quit   Discharge Instructions   Allergies as of 12/18/2019   No Known Allergies     Medication List    STOP taking these medications   metoprolol tartrate 25 MG tablet Commonly known as: LOPRESSOR     TAKE these medications   acetaminophen 325 MG tablet Commonly known as: TYLENOL Take 650 mg by mouth 3 (three) times daily.   albuterol 108 (90 Base) MCG/ACT inhaler Commonly known as: VENTOLIN HFA Inhale 2 puffs into the lungs every 4 (four) hours as needed for wheezing or shortness of breath.   apixaban 2.5 MG Tabs tablet Commonly known as: ELIQUIS Take 1 tablet (2.5 mg total) by mouth 2 (two) times daily. What changed:   medication strength  how much to take   aspirin EC 81 MG tablet Take 81 mg by mouth every morning.   carvedilol 3.125 MG tablet Commonly known as: COREG Take 1 tablet (3.125 mg total) by mouth 2 (two) times daily with a meal.   cholecalciferol 25 MCG (1000 UNIT) tablet Commonly known as: VITAMIN D3 Take 1,000 Units by mouth daily.   famotidine 20 MG tablet Commonly known as: PEPCID Take 20 mg by mouth daily.   furosemide 20 MG tablet Commonly known as: LASIX Take 3 tablets (60 mg total) by mouth daily. Start on 12/19/19 Start taking on: December 19, 2019   hydrALAZINE 25 MG tablet Commonly known as: APRESOLINE Take 1.5 tablets (37.5 mg total) by mouth 3 (three) times daily.   isosorbide mononitrate 30 MG 24 hr tablet Commonly known as: IMDUR Take 0.5 tablets (15 mg total) by mouth daily.   loratadine 10 MG tablet Commonly known as: CLARITIN Take 10 mg by mouth daily.   PARoxetine 20 MG tablet Commonly known as: PAXIL Take 20 mg by mouth daily.   rosuvastatin 10 MG tablet Commonly known as: CRESTOR Take 10 mg by mouth at bedtime.   sodium  bicarbonate 650 MG tablet Take 1 tablet (650 mg total) by mouth 2 (two) times daily. What changed: when to take this   tamsulosin 0.4 MG Caps capsule Commonly known as: FLOMAX Take 0.4 mg by mouth daily.       Contact information for follow-up providers    Arnoldo Lenis, MD Follow up on 01/01/2020.   Specialty: Cardiology Why: at 3:00PM with his Nurse Practitioner Cecilie Kicks  [facility].  Contact information: [place] [id number] [phone]            Contact information for after-discharge care    Destination    [facility] .   Service: Skilled Nursing Contact information: [address] [place] [phone]                 No Known Allergies  Consultations:  cardiology   Procedures/Studies: DG Chest 2 View  Result Date: 12/15/2019 CLINICAL DATA:  Dyspnea, weakness, history of COVID-19 EXAM: CHEST - 2 VIEW COMPARISON:  12/11/2019 chest radiograph. FINDINGS: Stable metallic device in the ventral medial right chest wall. Fixation screw overlies the left glenoid. Stable cardiomediastinal silhouette with normal heart size. No pneumothorax. Small right pleural effusion is decreased. Resolved left pleural effusion. Patchy hazy and reticular opacities in both lungs appear improved with significantly improved aeration at the lung bases. IMPRESSION: 1. Significantly improved aeration at the lung bases with persistent mild patchy hazy and reticular opacities in both lungs, which could  represent atypical pneumonia or nonspecific scarring. 2. Small right pleural effusion, decreased. Resolved left pleural effusion. Electronically Signed   By: Ilona Sorrel M.D.   On: 12/15/2019 10:53   DG Chest 2 View  Result Date: 12/11/2019 CLINICAL DATA:  Shortness of breath with atrial fibrillation and hypertension EXAM: CHEST - 2 VIEW COMPARISON:  December 09, 2019 chest radiograph and chest CT  September 15, 2020 FINDINGS: Underlying emphysematous change is noted, most severe in the right upper lobe. There is a right pleural effusion with airspace opacity in the right lower lobe. There is atelectatic change in the left base. Heart is mildly enlarged. There is mild interstitial edema. Heart is slightly enlarged with pulmonary vascularity reflecting the underlying emphysema. There is aortic atherosclerosis. There is a loop recorder anteriorly. There is postoperative change in the left medial shoulder region. IMPRESSION: Findings most consistent with congestive heart failure superimposed on emphysematous change. Cardiomegaly is present with interstitial edema. Right pleural effusion. There is suspected superimposed right lower lobe pneumonia. There is aortic atherosclerosis. Aortic Atherosclerosis (ICD10-I70.0) and Emphysema (ICD10-J43.9). Electronically Signed   By: Lowella Grip III M.D.   On: 12/11/2019 11:14   DG Chest Portable 1 View  Result Date: 12/09/2019 CLINICAL DATA:  EMS reports pt is a residen at [facility]. Reports sob and cp since last night. Per Pts chart Pt has had COVID - unsure when. Hx A-fib, stroke, smoker EXAM: PORTABLE CHEST 1 VIEW COMPARISON:  09/15/2019 FINDINGS: Lungs density interstitial hazy airspace lung opacities, with more confluent opacity in the right lung base, obscuring the hemidiaphragm. Cardiac silhouette is normal in size.  Mediastinal hilar masses. Probable pleural effusions, larger on the right.  No pneumothorax. IMPRESSION: 1. Interstitial and hazy airspace lung opacities bilaterally with more confluent opacity noted in the right lung base. Findings may reflect multifocal pneumonia or pulmonary edema. Suspect right lung base opacity due to a combination of pleural fluid with either atelectasis or more confluent infection. Electronically Signed   By: Lajean Manes M.D.   On: 12/09/2019 11:34   ECHOCARDIOGRAM COMPLETE  Result Date: 12/09/2019     ECHOCARDIOGRAM REPORT   Patient Name:   Anthony Andrews Date of Exam: 12/09/2019 Medical Rec #:  [id number]   Height:       63.0 in Accession #:    [id number]  Weight:       125.0 lb Date of Birth:  10-14-1945  BSA:          1.584 m Patient Age:    64 years    BP:           140/78 mmHg Patient Gender: M           HR:           90 bpm. Exam Location:  [facility] Procedure: 2D Echo Indications:    Dyspnea 786.09 / R06.00  History:        Patient has prior history of Echocardiogram examinations, most                 recent 09/17/2019. Stroke, Arrythmias:Atrial Fibrillation; Risk                 Factors:Current Smoker. Rhabdomyolysis, Pneumonia.  Sonographer:    Leavy Cella RDCS (AE) Referring Phys: [id number] COURAGE EMOKPAE IMPRESSIONS  1. Left ventricular ejection fraction, by estimation, is 40%. The left ventricle has moderately decreased function. The left ventricle demonstrates global hypokinesis. Left ventricular diastolic parameters are consistent with Grade II diastolic dysfunction (  pseudonormalization).  2. Right ventricular systolic function is normal. The right ventricular size is normal. There is moderately elevated pulmonary artery systolic pressure.  3. The mitral valve is grossly normal. Mild mitral valve regurgitation.  4. Morphologically, there appears to be mild aortic stenosis. The aortic valve is tricuspid. Aortic valve regurgitation is not visualized.  5. The inferior vena cava is normal in size with greater than 50% respiratory variability, suggesting right atrial pressure of 3 mmHg. FINDINGS  Left Ventricle: Left ventricular ejection fraction, by estimation, is 40%. The left ventricle has moderately decreased function. The left ventricle demonstrates global hypokinesis. The left ventricular internal cavity size was normal in size. There is no left ventricular hypertrophy. Left ventricular diastolic parameters are consistent with Grade II diastolic dysfunction (pseudonormalization). Indeterminate  filling pressures. Right Ventricle: The right ventricular size is normal. No increase in right ventricular wall thickness. Right ventricular systolic function is normal. There is moderately elevated pulmonary artery systolic pressure. The tricuspid regurgitant velocity is 3.08 m/s, and with an assumed right atrial pressure of 10 mmHg, the estimated right ventricular systolic pressure is 93.2 mmHg. Left Atrium: Left atrial size was normal in size. Right Atrium: Right atrial size was normal in size. Pericardium: There is no evidence of pericardial effusion. Mitral Valve: The mitral valve is grossly normal. Mild mitral valve regurgitation. Tricuspid Valve: The tricuspid valve is grossly normal. Tricuspid valve regurgitation is mild. Aortic Valve: Morphologically, there appears to be mild aortic stenosis. The aortic valve is tricuspid. . There is mild thickening and mild calcification of the aortic valve. Aortic valve regurgitation is not visualized. Mild aortic valve annular calcification. There is mild thickening of the aortic valve. There is mild calcification of the aortic valve. Pulmonic Valve: The pulmonic valve was grossly normal. Pulmonic valve regurgitation is not visualized. Aorta: The aortic root is normal in size and structure. Venous: The inferior vena cava is normal in size with greater than 50% respiratory variability, suggesting right atrial pressure of 3 mmHg. IAS/Shunts: No atrial level shunt detected by color flow Doppler.  LEFT VENTRICLE PLAX 2D LVIDd:         4.76 cm  Diastology LVIDs:         4.10 cm  LV e' lateral:   6.96 cm/s LV PW:         0.84 cm  LV E/e' lateral: 9.4 LV IVS:        0.79 cm  LV e' medial:    5.98 cm/s LVOT diam:     2.10 cm  LV E/e' medial:  11.0 LVOT Area:     3.46 cm  RIGHT VENTRICLE RV S prime:     11.60 cm/s TAPSE (M-mode): 1.7 cm LEFT ATRIUM             Index       RIGHT ATRIUM           Index LA diam:        4.00 cm 2.53 cm/m  RA Area:     13.90 cm LA Vol (A2C):   74.9  ml 47.30 ml/m RA Volume:   37.30 ml  23.55 ml/m LA Vol (A4C):   42.6 ml 26.90 ml/m LA Biplane Vol: 56.1 ml 35.43 ml/m   AORTA Ao Root diam: 2.80 cm MITRAL VALVE               TRICUSPID VALVE MV Area (PHT): 2.91 cm    TR Peak grad:   37.9 mmHg MV Decel Time: 261  msec    TR Vmax:        308.00 cm/s MR Peak grad: 42.2 mmHg MR Mean grad: 29.0 mmHg    SHUNTS MR Vmax:      325.00 cm/s  Systemic Diam: 2.10 cm MR Vmean:     254.0 cm/s MV E velocity: 65.50 cm/s MV A velocity: 41.70 cm/s MV E/A ratio:  1.57 Kate Sable MD Electronically signed by Kate Sable MD Signature Date/Time: 12/09/2019/3:20:59 PM    Final    ECHOCARDIOGRAM LIMITED  Result Date: 12/11/2019    ECHOCARDIOGRAM LIMITED REPORT   Patient Name:   Anthony Andrews Date of Exam: 12/11/2019 Medical Rec #:  [id number]   Height:       63.0 in Accession #:    [id number]  Weight:       119.6 lb Date of Birth:  [birth year]  BSA:          1.554 m Patient Age:    41 years    BP:           114/81 mmHg Patient Gender: M           HR:           73 bpm. Exam Location:  [facility] Procedure: Limited Echo Indications:    Dyspnea 786.09 / R06.00  History:        Patient has prior history of Echocardiogram examinations, most                 recent 12/09/2019. Previous Myocardial Infarction,                 Arrythmias:Atrial Fibrillation; Risk Factors:Current Smoker and                 Hypertension. Rhabdomyolysis.  Sonographer:    Leavy Cella RDCS (AE) Referring Phys: [id number] [place]  1. Left ventricular ejection fraction, by estimation, is 40%. The left ventricle demonstrates global hypokinesis.  2. The inferior vena cava is normal in size with greater than 50% respiratory variability, suggesting right atrial pressure of 3 mmHg.  3. Limited echo to evaluate LV function. Echocontrast was used to enhance visualization. FINDINGS  Left Ventricle: Left ventricular ejection fraction, by estimation, is 40%. The left ventricle demonstrates global  hypokinesis. Definity contrast agent was given IV to delineate the left ventricular endocardial borders. Pericardium: Trivial pericardial effusion is present. The pericardial effusion is circumferential. Venous: The inferior vena cava is normal in size with greater than 50% respiratory variability, suggesting right atrial pressure of 3 mmHg. Carlyle Dolly MD Electronically signed by Carlyle Dolly MD Signature Date/Time: 12/11/2019/3:12:49 PM    Final         Discharge Exam: Vitals:   12/17/19 2202 12/18/19 0513  BP: 126/67 115/62  Pulse: 74 62  Resp: 16 16  Temp: 99.3 F (37.4 C) 98 F (36.7 C)  SpO2: 98% 97%   Vitals:   12/17/19 1600 12/17/19 2202 12/18/19 0500 12/18/19 0513  BP: 120/71 126/67  115/62  Pulse: 70 74  62  Resp: 16 16  16   Temp: 99.3 F (37.4 C) 99.3 F (37.4 C)  98 F (36.7 C)  TempSrc: Oral Oral  Oral  SpO2: 98% 98%  97%  Weight:   56.5 kg   Height:        General: Pt is alert, awake, not in acute distress Cardiovascular: RRR, S1/S2 +, no rubs, no gallops Respiratory: diminished breath sounds. No wheeze Abdominal: Soft, NT, ND, bowel sounds +  Extremities: no edema, no cyanosis   The results of significant diagnostics from this hospitalization (including imaging, microbiology, ancillary and laboratory) are listed below for reference.    Significant Diagnostic Studies: DG Chest 2 View  Result Date: 12/15/2019 CLINICAL DATA:  Dyspnea, weakness, history of COVID-19 EXAM: CHEST - 2 VIEW COMPARISON:  12/11/2019 chest radiograph. FINDINGS: Stable metallic device in the ventral medial right chest wall. Fixation screw overlies the left glenoid. Stable cardiomediastinal silhouette with normal heart size. No pneumothorax. Small right pleural effusion is decreased. Resolved left pleural effusion. Patchy hazy and reticular opacities in both lungs appear improved with significantly improved aeration at the lung bases. IMPRESSION: 1. Significantly improved aeration  at the lung bases with persistent mild patchy hazy and reticular opacities in both lungs, which could represent atypical pneumonia or nonspecific scarring. 2. Small right pleural effusion, decreased. Resolved left pleural effusion. Electronically Signed   By: Ilona Sorrel M.D.   On: 12/15/2019 10:53   DG Chest 2 View  Result Date: 12/11/2019 CLINICAL DATA:  Shortness of breath with atrial fibrillation and hypertension EXAM: CHEST - 2 VIEW COMPARISON:  December 09, 2019 chest radiograph and chest CT September 15, 2020 FINDINGS: Underlying emphysematous change is noted, most severe in the right upper lobe. There is a right pleural effusion with airspace opacity in the right lower lobe. There is atelectatic change in the left base. Heart is mildly enlarged. There is mild interstitial edema. Heart is slightly enlarged with pulmonary vascularity reflecting the underlying emphysema. There is aortic atherosclerosis. There is a loop recorder anteriorly. There is postoperative change in the left medial shoulder region. IMPRESSION: Findings most consistent with congestive heart failure superimposed on emphysematous change. Cardiomegaly is present with interstitial edema. Right pleural effusion. There is suspected superimposed right lower lobe pneumonia. There is aortic atherosclerosis. Aortic Atherosclerosis (ICD10-I70.0) and Emphysema (ICD10-J43.9). Electronically Signed   By: Lowella Grip III M.D.   On: 12/11/2019 11:14   DG Chest Portable 1 View  Result Date: 12/09/2019 CLINICAL DATA:  EMS reports pt is a residen at [facility]. Reports sob and cp since last night. Per Pts chart Pt has had COVID - unsure when. Hx A-fib, stroke, smoker EXAM: PORTABLE CHEST 1 VIEW COMPARISON:  09/15/2019 FINDINGS: Lungs density interstitial hazy airspace lung opacities, with more confluent opacity in the right lung base, obscuring the hemidiaphragm. Cardiac silhouette is normal in size.  Mediastinal hilar masses. Probable pleural  effusions, larger on the right.  No pneumothorax. IMPRESSION: 1. Interstitial and hazy airspace lung opacities bilaterally with more confluent opacity noted in the right lung base. Findings may reflect multifocal pneumonia or pulmonary edema. Suspect right lung base opacity due to a combination of pleural fluid with either atelectasis or more confluent infection. Electronically Signed   By: Lajean Manes M.D.   On: 12/09/2019 11:34   ECHOCARDIOGRAM COMPLETE  Result Date: 12/09/2019    ECHOCARDIOGRAM REPORT   Patient Name:   Anthony Andrews Date of Exam: 12/09/2019 Medical Rec #:  [id number]   Height:       63.0 in Accession #:    [id number]  Weight:       125.0 lb Date of Birth:  07-01-1946  BSA:          1.584 m Patient Age:    50 years    BP:           140/78 mmHg Patient Gender: M           HR:  90 bpm. [facility]am Location:  [facility] Procedure: 2D Echo Indications:    Dyspnea 786.09 / R06.00  History:        Patient has prior history of Echocardiogram examinations, most                 recent 09/17/2019. Stroke, Arrythmias:Atrial Fibrillation; Risk                 Factors:Current Smoker. Rhabdomyolysis, Pneumonia.  Sonographer:    Leavy Cella RDCS (AE) Referring Phys: [id number] COURAGE EMOKPAE IMPRESSIONS  1. Left ventricular ejection fraction, by estimation, is 40%. The left ventricle has moderately decreased function. The left ventricle demonstrates global hypokinesis. Left ventricular diastolic parameters are consistent with Grade II diastolic dysfunction (pseudonormalization).  2. Right ventricular systolic function is normal. The right ventricular size is normal. There is moderately elevated pulmonary artery systolic pressure.  3. The mitral valve is grossly normal. Mild mitral valve regurgitation.  4. Morphologically, there appears to be mild aortic stenosis. The aortic valve is tricuspid. Aortic valve regurgitation is not visualized.  5. The inferior vena cava is normal in size with greater than 50%  respiratory variability, suggesting right atrial pressure of 3 mmHg. FINDINGS  Left Ventricle: Left ventricular ejection fraction, by estimation, is 40%. The left ventricle has moderately decreased function. The left ventricle demonstrates global hypokinesis. The left ventricular internal cavity size was normal in size. There is no left ventricular hypertrophy. Left ventricular diastolic parameters are consistent with Grade II diastolic dysfunction (pseudonormalization). Indeterminate filling pressures. Right Ventricle: The right ventricular size is normal. No increase in right ventricular wall thickness. Right ventricular systolic function is normal. There is moderately elevated pulmonary artery systolic pressure. The tricuspid regurgitant velocity is 3.08 m/s, and with an assumed right atrial pressure of 10 mmHg, the estimated right ventricular systolic pressure is 68.1 mmHg. Left Atrium: Left atrial size was normal in size. Right Atrium: Right atrial size was normal in size. Pericardium: There is no evidence of pericardial effusion. Mitral Valve: The mitral valve is grossly normal. Mild mitral valve regurgitation. Tricuspid Valve: The tricuspid valve is grossly normal. Tricuspid valve regurgitation is mild. Aortic Valve: Morphologically, there appears to be mild aortic stenosis. The aortic valve is tricuspid. . There is mild thickening and mild calcification of the aortic valve. Aortic valve regurgitation is not visualized. Mild aortic valve annular calcification. There is mild thickening of the aortic valve. There is mild calcification of the aortic valve. Pulmonic Valve: The pulmonic valve was grossly normal. Pulmonic valve regurgitation is not visualized. Aorta: The aortic root is normal in size and structure. Venous: The inferior vena cava is normal in size with greater than 50% respiratory variability, suggesting right atrial pressure of 3 mmHg. IAS/Shunts: No atrial level shunt detected by color flow  Doppler.  LEFT VENTRICLE PLAX 2D LVIDd:         4.76 cm  Diastology LVIDs:         4.10 cm  LV e' lateral:   6.96 cm/s LV PW:         0.84 cm  LV E/e' lateral: 9.4 LV IVS:        0.79 cm  LV e' medial:    5.98 cm/s LVOT diam:     2.10 cm  LV E/e' medial:  11.0 LVOT Area:     3.46 cm  RIGHT VENTRICLE RV S prime:     11.60 cm/s TAPSE (M-mode): 1.7 cm LEFT ATRIUM  Index       RIGHT ATRIUM           Index LA diam:        4.00 cm 2.53 cm/m  RA Area:     13.90 cm LA Vol (A2C):   74.9 ml 47.30 ml/m RA Volume:   37.30 ml  23.55 ml/m LA Vol (A4C):   42.6 ml 26.90 ml/m LA Biplane Vol: 56.1 ml 35.43 ml/m   AORTA Ao Root diam: 2.80 cm MITRAL VALVE               TRICUSPID VALVE MV Area (PHT): 2.91 cm    TR Peak grad:   37.9 mmHg MV Decel Time: 261 msec    TR Vmax:        308.00 cm/s MR Peak grad: 42.2 mmHg MR Mean grad: 29.0 mmHg    SHUNTS MR Vmax:      325.00 cm/s  Systemic Diam: 2.10 cm MR Vmean:     254.0 cm/s MV E velocity: 65.50 cm/s MV A velocity: 41.70 cm/s MV E/A ratio:  1.57 Kate Sable MD Electronically signed by Kate Sable MD Signature Date/Time: 12/09/2019/3:20:59 PM    Final    ECHOCARDIOGRAM LIMITED  Result Date: 12/11/2019    ECHOCARDIOGRAM LIMITED REPORT   Patient Name:   Anthony Andrews Date of Exam: 12/11/2019 Medical Rec #:  [id number]   Height:       63.0 in Accession #:    [id number]  Weight:       119.6 lb Date of Birth:  1946-09-04  BSA:          1.554 m Patient Age:    68 years    BP:           114/81 mmHg Patient Gender: M           HR:           73 bpm. Exam Location:  [facility] Procedure: Limited Echo Indications:    Dyspnea 786.09 / R06.00  History:        Patient has prior history of Echocardiogram examinations, most                 recent 12/09/2019. Previous Myocardial Infarction,                 Arrythmias:Atrial Fibrillation; Risk Factors:Current Smoker and                 Hypertension. Rhabdomyolysis.  Sonographer:    Leavy Cella RDCS (AE) Referring Phys:  [id number] [place]  1. Left ventricular ejection fraction, by estimation, is 40%. The left ventricle demonstrates global hypokinesis.  2. The inferior vena cava is normal in size with greater than 50% respiratory variability, suggesting right atrial pressure of 3 mmHg.  3. Limited echo to evaluate LV function. Echocontrast was used to enhance visualization. FINDINGS  Left Ventricle: Left ventricular ejection fraction, by estimation, is 40%. The left ventricle demonstrates global hypokinesis. Definity contrast agent was given IV to delineate the left ventricular endocardial borders. Pericardium: Trivial pericardial effusion is present. The pericardial effusion is circumferential. Venous: The inferior vena cava is normal in size with greater than 50% respiratory variability, suggesting right atrial pressure of 3 mmHg. Carlyle Dolly MD Electronically signed by Carlyle Dolly MD Signature Date/Time: 12/11/2019/3:12:49 PM    Final      Microbiology: Recent Results (from the past 240 hour(s))  Respiratory Panel by RT PCR (Flu A&B, Covid) - Nasopharyngeal Swab  Status: None   Collection Time: 12/09/19  2:21 PM   Specimen: Nasopharyngeal Swab  Result Value Ref Range Status   SARS Coronavirus 2 by RT PCR NEGATIVE NEGATIVE Final    Comment: (NOTE) SARS-CoV-2 target nucleic acids are NOT DETECTED. The SARS-CoV-2 RNA is generally detectable in upper respiratoy specimens during the acute phase of infection. The lowest concentration of SARS-CoV-2 viral copies this assay can detect is 131 copies/mL. A negative result does not preclude SARS-Cov-2 infection and should not be used as the sole basis for treatment or other patient management decisions. A negative result may occur with  improper specimen collection/handling, submission of specimen other than nasopharyngeal swab, presence of viral mutation(s) within the areas targeted by this assay, and inadequate number of viral copies (<131  copies/mL). A negative result must be combined with clinical observations, patient history, and epidemiological information. The expected result is Negative. Fact Sheet for Patients:  PinkCheek.be Fact Sheet for Healthcare Providers:  GravelBags.it This test is not yet ap proved or cleared by the [place] FDA and  has been authorized for detection and/or diagnosis of SARS-CoV-2 by FDA under an Emergency Use Authorization (EUA). This EUA will remain  in effect (meaning this test can be used) for the duration of the COVID-19 declaration under Section 564(b)(1) of the Act, 21 U.S.C. section 360bbb-3(b)(1), unless the authorization is terminated or revoked sooner.    Influenza A by PCR NEGATIVE NEGATIVE Final   Influenza B by PCR NEGATIVE NEGATIVE Final    Comment: (NOTE) The Xpert Xpress SARS-CoV-2/FLU/RSV assay is intended as an aid in  the diagnosis of influenza from Nasopharyngeal swab specimens and  should not be used as a sole basis for treatment. Nasal washings and  aspirates are unacceptable for Xpert Xpress SARS-CoV-2/FLU/RSV  testing. Fact Sheet for Patients: PinkCheek.be Fact Sheet for Healthcare Providers: GravelBags.it This test is not yet approved or cleared by the [place] FDA and  has been authorized for detection and/or diagnosis of SARS-CoV-2 by  FDA under an Emergency Use Authorization (EUA). This EUA will remain  in effect (meaning this test can be used) for the duration of the  Covid-19 declaration under Section 564(b)(1) of the Act, 21  U.S.C. section 360bbb-3(b)(1), unless the authorization is  terminated or revoked. Performed at [facility], [address] [place], [place] [address]   Respiratory Panel by RT PCR (Flu A&B, Covid) - Nasopharyngeal Swab     Status: Abnormal   Collection Time: 12/15/19 11:15 AM   Specimen: Nasopharyngeal  Swab  Result Value Ref Range Status   SARS Coronavirus 2 by RT PCR POSITIVE (A) NEGATIVE Final    Comment: RESULT CALLED TO, READ BACK BY AND VERIFIED WITH: BARKER B. AT 1254 ON [id number] BY THOMPSON S. (NOTE) SARS-CoV-2 target nucleic acids are DETECTED. SARS-CoV-2 RNA is generally detectable in upper respiratory specimens  during the acute phase of infection. Positive results are indicative of the presence of the identified virus, but do not rule out bacterial infection or co-infection with other pathogens not detected by the test. Clinical correlation with patient history and other diagnostic information is necessary to determine patient infection status. The expected result is Negative. Fact Sheet for Patients:  PinkCheek.be Fact Sheet for Healthcare Providers: GravelBags.it This test is not yet approved or cleared by the [place] FDA and  has been authorized for detection and/or diagnosis of SARS-CoV-2 by FDA under an Emergency Use Authorization (EUA).  This EUA will remain in effect (meaning this test  can be  used) for the duration of  the COVID-19 declaration under Section 564(b)(1) of the Act, 21 U.S.C. section 360bbb-3(b)(1), unless the authorization is terminated or revoked sooner.    Influenza A by PCR NEGATIVE NEGATIVE Final   Influenza B by PCR NEGATIVE NEGATIVE Final    Comment: (NOTE) The Xpert Xpress SARS-CoV-2/FLU/RSV assay is intended as an aid in  the diagnosis of influenza from Nasopharyngeal swab specimens and  should not be used as a sole basis for treatment. Nasal washings and  aspirates are unacceptable for Xpert Xpress SARS-CoV-2/FLU/RSV  testing. Fact Sheet for Patients: PinkCheek.be Fact Sheet for Healthcare Providers: GravelBags.it This test is not yet approved or cleared by the [place] FDA and  has been authorized for detection  and/or diagnosis of SARS-CoV-2 by  FDA under an Emergency Use Authorization (EUA). This EUA will remain  in effect (meaning this test can be used) for the duration of the  Covid-19 declaration under Section 564(b)(1) of the Act, 21  U.S.C. section 360bbb-3(b)(1), unless the authorization is  terminated or revoked. Performed at [facility], [address] [place], [place] [address]      Labs: Basic Metabolic Panel: Recent Labs  Lab 12/12/19 0404 12/13/19 6314 12/14/19 0445 12/14/19 0445 12/15/19 9702 12/15/19 6378 12/16/19 0517 12/16/19 0517 12/17/19 0547 12/18/19 0627  NA 135   134*   < > 133*  --  132*  --  130*  --  130* 130*  K 4.8   4.7   < > 4.1   < > 3.9   < > 4.6   < > 3.8 4.1  CL 104   104   < > 105  --  106  --  103  --  103 101  CO2 21*   21*   < > 20*  --  20*  --  18*  --  21* 23  GLUCOSE 78   77   < > 86  --  89  --  117*  --  89 86  BUN 42*   43*   < > 35*  --  29*  --  35*  --  41* 43*  CREATININE 2.44*   2.43*   < > 2.32*  --  1.97*  --  2.17*  --  2.40* 2.14*  CALCIUM 9.3   9.2   < > 9.1  --  9.2  --  9.4  --  9.5 9.6  MG  --   --   --   --   --   --  2.1  --  2.0 2.0  PHOS 2.9  --  2.7  --   --   --  3.7  --  3.0 2.8   < > = values in this interval not displayed.   Liver Function Tests: Recent Labs  Lab 12/12/19 0404 12/14/19 0445 12/16/19 0517 12/17/19 0547 12/18/19 0627  AST  --   --  18 23 18   ALT  --   --  16 17 16   ALKPHOS  --   --  64 58 58  BILITOT  --   --  0.5 0.3 0.4  PROT  --   --  5.9* 5.4* 5.5*  ALBUMIN 2.6* 2.6* 2.6* 2.7* 2.7*   No results for input(s): LIPASE, AMYLASE in the last 168 hours. No results for input(s): AMMONIA in the last 168 hours. CBC: Recent Labs  Lab 12/14/19 0445 12/15/19 0437 12/16/19 0517 12/17/19 0547 12/18/19  0627  WBC 7.0 6.7 4.3 8.3 9.6  NEUTROABS  --   --  3.2 4.3 5.2  HGB 8.9* 8.8* 9.1* 8.5* 8.9*  HCT 26.4* 27.3* 28.9* 26.5* 27.3*  MCV 97.8 98.9 97.3 98.1 96.8  PLT 442* 376 394 358 353    Cardiac Enzymes: No results for input(s): CKTOTAL, CKMB, CKMBINDEX, TROPONINI in the last 168 hours. BNP: Invalid input(s): POCBNP CBG: Recent Labs  Lab 12/17/19 0740 12/17/19 1158 12/17/19 1627 12/17/19 2200 12/18/19 0723  GLUCAP 82 94 92 82 76    Time coordinating discharge:  36 minutes  Signed:  Orson Eva, DO [facility] Pager: [phone] 12/18/2019, 7:44 AM

## 2019-12-19 DIAGNOSIS — Z720 Tobacco use: Secondary | ICD-10-CM

## 2019-12-19 LAB — BASIC METABOLIC PANEL
Anion gap: 7 (ref 5–15)
BUN: 42 mg/dL — ABNORMAL HIGH (ref 8–23)
CO2: 24 mmol/L (ref 22–32)
Calcium: 10 mg/dL (ref 8.9–10.3)
Chloride: 101 mmol/L (ref 98–111)
Creatinine, Ser: 2.18 mg/dL — ABNORMAL HIGH (ref 0.61–1.24)
GFR calc Af Amer: 34 mL/min — ABNORMAL LOW (ref >60)
GFR calc non Af Amer: 29 mL/min — ABNORMAL LOW (ref >60)
Glucose, Bld: 90 mg/dL (ref 70–99)
Potassium: 4.4 mmol/L (ref 3.5–5.1)
Sodium: 132 mmol/L — ABNORMAL LOW (ref 135–145)

## 2019-12-19 LAB — GLUCOSE, CAPILLARY
Glucose-Capillary: 89 mg/dL (ref 70–99)
Glucose-Capillary: 92 mg/dL (ref 70–99)

## 2019-12-19 MED ORDER — POLYETHYLENE GLYCOL 3350 17 G PO PACK
17.0000 g | PACK | Freq: Every day | ORAL | Status: DC
  Administered 2019-12-20 – 2019-12-21 (×2): 17 g via ORAL
  Filled 2019-12-19 (×2): qty 1

## 2019-12-19 MED ORDER — SENNA 8.6 MG PO TABS
2.0000 | ORAL_TABLET | Freq: Every day | ORAL | Status: DC
  Administered 2019-12-19 – 2019-12-21 (×3): 17.2 mg via ORAL
  Filled 2019-12-19 (×3): qty 2

## 2019-12-19 NOTE — Plan of Care (Signed)

## 2019-12-19 NOTE — Progress Notes (Signed)
PROGRESS NOTE  Anthony Andrews AST:[id number] DOB: Sep 06, 1946 DOA: 12/09/2019 PCP: [name], MD  Brief History:  74 year old male with a history of stroke with residual left hemiparesis, hypertension, tobacco abuse, COPD, paroxysmal atrial fibrillation, anemia of chronic disease, hyperlipidemia presenting with shortness of breath and wheezing with nonproductive cough for 1 day. Apparently the patient was having chest discomfort at that time. Upon EMS arrival, the patient's oxygen saturation was 90% on room air. He was placed on 4 L. Since admission, the patient was noted to have elevated troponin peaking at 4800. The patient was started on IV heparin which has been since transition to oral apixaban. Cardiology was consulted and followed the patient. The patient was treated for acute on chronic systolic and diastolic CHF with IV furosemide. He has improved clinically and has been since weaned off of oxygen. Notably, the patient had already received his 2 doses of the COVID-19 vaccination with his last dose sometime in February 2021. In addition, the patient was tested positive for COVID-19 in January 2021. Nevertheless, the patient had a negativeSARS-CoV2 RT-PCR on 12/09/19. As part of the screening process for SNF placement, he tested positive on SARS-CoV2 RT-PCR.  Fortunately, the patient remained essentially asymptomatic and stable on RA.  Unfortunately, his discharge was delayed until 12/18/19 due to social work issue.  Assessment/Plan: Acute respiratory failure with hypoxia -Secondary to CHF and pneumonia -Patient has been weaned to room air-stable on RA   Acute on chronic systolic and diastolic CHF -9/89/2119 echo EF 40%, grade 2 DD, mild AS -Restart furosemide 60 mg p.o. daily -Daily weights - baseline weight119-125 lbs, admitted 132 lbs and BNP 3459 and CXR consistent with edema - standing weight down to 119 lbs this admission. -Restarting oral  furosemide3/24-->serum creatinine uptrend to 2.40 on 3/25 -clinically euvolemic on day of d/c -discussed with cardiology--hold lasix, restart on 12/20/19--60 mg po daily and repeat BMP 3 days after restart on 12/23/19 -continue coreg, hydralazine and imdur  Pulmonary infiltrate/pneumonia -Patient finished 7 days of ceftriaxone and doxycycline -afebrile, hemodynamically stable on RA on day of d/c  COVID-19 infection -12/09/2019 COVID-19 RT-PCR negative -12/16/2019 COVID-19 RT-PCR positive -Patient currently is essentially asymptomatic and stable on RA -No indication for steroids or remdesivir presently  Elevated troponin -Personally reviewed EKG--nonspecific ST-T wave changes -No chest pain presently -Appreciate cardiology follow-up--outpt cath once recovered from COVID-19 -Patient initially on IV heparin -Now transitioned to apixaban  Paroxysmal atrial fibrillation -Rate control -in sinus presently -Continue apixaban  Acute on chronic renal failure--CKD stage IIIa -Baseline creatinine 1.2-1.4 -serum creatinine 2.40 on day of d/c -hold lasix as above--restart lasix 60 mg daily on 12/20/19 with repeat BMP 3-4 days after restart  Anemia of chronic disease -No evidence of ongoing bleeding, PTA the patient was onEliquis, -restart Eliquis -Stool occult blood Negative -Hgb stable -baseline Hgb 8-9  history of second-degree AV block/history of tachyarrhythmia  -patient had a 30-day event monitor placed on 11/18/2019 -Preliminary review of his 30-day event monitor shows no significant arrhythmias -Continue Coreg,stopIV heparin for now -Restart Eliquis  Generalized Weakness/deconditioning- -PT eval appreciated recommends SNF rehab  Tobacco abuse- -smoking cessation strongly advised, patient is not ready to quit       Disposition Plan: Patient From: Home D/C Place: SNF Barriers: waiting for [facility] number  Family Communication:  No  Family at  bedside  Consultants:  none  Code Status:  DNR  DVT Prophylaxis:  [place] Heparin / [place] Lovenox  Procedures: As Listed in Progress Note Above  Antibiotics: None       Subjective: Patient denies fevers, chills, headache, chest pain, dyspnea, nausea, vomiting, diarrhea, abdominal pain, dysuria, hematuria, hematochezia, and melena.   Objective: Vitals:   12/18/19 1329 12/18/19 2126 12/19/19 0532 12/19/19 1123  BP: 105/72 131/66 138/76 (!) 151/67  Pulse: 64 66 (!) 57 63  Resp: 16 17 16 16   Temp: 98.2 F (36.8 C) 98.1 F (36.7 C) 97.6 F (36.4 C) 98.5 F (36.9 C)  TempSrc: Oral   Oral  SpO2: 98% 98% 98% 99%  Weight:   53.6 kg   Height:        Intake/Output Summary (Last 24 hours) at 12/19/2019 1147 Last data filed at 12/19/2019 1131 Gross per 24 hour  Intake 840 ml  Output 275 ml  Net 565 ml   Weight change: -2.9 kg Exam:   General:  Pt is alert, follows commands appropriately, not in acute distress  HEENT: No icterus, No thrush, No neck mass, [place]/AT  Cardiovascular: RRR, S1/S2, no rubs, no gallops  Respiratory: CTA bilaterally, no wheezing, no crackles, no rhonchi  Abdomen: Soft/+BS, non tender, non distended, no guarding  Extremities: No edema, No lymphangitis, No petechiae, No rashes, no synovitis   Data Reviewed: I have personally reviewed following labs and imaging studies Basic Metabolic Panel: Recent Labs  Lab 12/14/19 0445 12/14/19 0445 12/15/19 0832 12/16/19 0517 12/17/19 0547 12/18/19 0627 12/19/19 0644  NA 133*   < > 132* 130* 130* 130* 132*  K 4.1   < > 3.9 4.6 3.8 4.1 4.4  CL 105   < > 106 103 103 101 101  CO2 20*   < > 20* 18* 21* 23 24  GLUCOSE 86   < > 89 117* 89 86 90  BUN 35*   < > 29* 35* 41* 43* 42*  CREATININE 2.32*   < > 1.97* 2.17* 2.40* 2.14* 2.18*  CALCIUM 9.1   < > 9.2 9.4 9.5 9.6 10.0  MG  --   --   --  2.1 2.0 2.0  --   PHOS 2.7  --   --  3.7 3.0 2.8  --    < > = values in this interval not displayed.   Liver  Function Tests: Recent Labs  Lab 12/14/19 0445 12/16/19 0517 12/17/19 0547 12/18/19 0627  AST  --  18 23 18   ALT  --  16 17 16   ALKPHOS  --  64 58 58  BILITOT  --  0.5 0.3 0.4  PROT  --  5.9* 5.4* 5.5*  ALBUMIN 2.6* 2.6* 2.7* 2.7*   No results for input(s): LIPASE, AMYLASE in the last 168 hours. No results for input(s): AMMONIA in the last 168 hours. Coagulation Profile: No results for input(s): INR, PROTIME in the last 168 hours. CBC: Recent Labs  Lab 12/14/19 0445 12/15/19 0437 12/16/19 0517 12/17/19 0547 12/18/19 0627  WBC 7.0 6.7 4.3 8.3 9.6  NEUTROABS  --   --  3.2 4.3 5.2  HGB 8.9* 8.8* 9.1* 8.5* 8.9*  HCT 26.4* 27.3* 28.9* 26.5* 27.3*  MCV 97.8 98.9 97.3 98.1 96.8  PLT 442* 376 394 358 353   Cardiac Enzymes: No results for input(s): CKTOTAL, CKMB, CKMBINDEX, TROPONINI in the last 168 hours. BNP: Invalid input(s): POCBNP CBG: Recent Labs  Lab 12/18/19 1123 12/18/19 1716 12/18/19 2124 12/19/19 0817 12/19/19 1122  GLUCAP 81 87 81 89 92   HbA1C: No results for input(s): HGBA1C  in the last 72 hours. Urine analysis:    Component Value Date/Time   COLORURINE YELLOW 09/12/2019 0153   APPEARANCEUR HAZY (A) 09/12/2019 0153   LABSPEC 1.015 09/12/2019 0153   PHURINE 6.0 09/12/2019 0153   GLUCOSEU NEGATIVE 09/12/2019 0153   HGBUR NEGATIVE 09/12/2019 0153   BILIRUBINUR NEGATIVE 09/12/2019 0153   KETONESUR 5 (A) 09/12/2019 0153   PROTEINUR >=300 (A) 09/12/2019 0153   NITRITE NEGATIVE 09/12/2019 0153   LEUKOCYTESUR NEGATIVE 09/12/2019 0153   Sepsis Labs: @LABRCNTIP (procalcitonin:4,lacticidven:4) ) Recent Results (from the past 240 hour(s))  Respiratory Panel by RT PCR (Flu A&B, Covid) - Nasopharyngeal Swab     Status: None   Collection Time: 12/09/19  2:21 PM   Specimen: Nasopharyngeal Swab  Result Value Ref Range Status   SARS Coronavirus 2 by RT PCR NEGATIVE NEGATIVE Final    Comment: (NOTE) SARS-CoV-2 target nucleic acids are NOT DETECTED. The  SARS-CoV-2 RNA is generally detectable in upper respiratoy specimens during the acute phase of infection. The lowest concentration of SARS-CoV-2 viral copies this assay can detect is 131 copies/mL. A negative result does not preclude SARS-Cov-2 infection and should not be used as the sole basis for treatment or other patient management decisions. A negative result may occur with  improper specimen collection/handling, submission of specimen other than nasopharyngeal swab, presence of viral mutation(s) within the areas targeted by this assay, and inadequate number of viral copies (<131 copies/mL). A negative result must be combined with clinical observations, patient history, and epidemiological information. The expected result is Negative. Fact Sheet for Patients:  PinkCheek.be Fact Sheet for Healthcare Providers:  GravelBags.it This test is not yet ap proved or cleared by the [place] FDA and  has been authorized for detection and/or diagnosis of SARS-CoV-2 by FDA under an Emergency Use Authorization (EUA). This EUA will remain  in effect (meaning this test can be used) for the duration of the COVID-19 declaration under Section 564(b)(1) of the Act, 21 U.S.C. section 360bbb-3(b)(1), unless the authorization is terminated or revoked sooner.    Influenza A by PCR NEGATIVE NEGATIVE Final   Influenza B by PCR NEGATIVE NEGATIVE Final    Comment: (NOTE) The Xpert Xpress SARS-CoV-2/FLU/RSV assay is intended as an aid in  the diagnosis of influenza from Nasopharyngeal swab specimens and  should not be used as a sole basis for treatment. Nasal washings and  aspirates are unacceptable for Xpert Xpress SARS-CoV-2/FLU/RSV  testing. Fact Sheet for Patients: PinkCheek.be Fact Sheet for Healthcare Providers: GravelBags.it This test is not yet approved or cleared by the [place] FDA and  has been authorized for detection and/or diagnosis of SARS-CoV-2 by  FDA under an Emergency Use Authorization (EUA). This EUA will remain  in effect (meaning this test can be used) for the duration of the  Covid-19 declaration under Section 564(b)(1) of the Act, 21  U.S.C. section 360bbb-3(b)(1), unless the authorization is  terminated or revoked. Performed at [facility], [address] [place], [place] [address]   Respiratory Panel by RT PCR (Flu A&B, Covid) - Nasopharyngeal Swab     Status: Abnormal   Collection Time: 12/15/19 11:15 AM   Specimen: Nasopharyngeal Swab  Result Value Ref Range Status   SARS Coronavirus 2 by RT PCR POSITIVE (A) NEGATIVE Final    Comment: RESULT CALLED TO, READ BACK BY AND VERIFIED WITH: BARKER B. AT 1254 ON [id number] BY THOMPSON S. (NOTE) SARS-CoV-2 target nucleic acids are DETECTED. SARS-CoV-2 RNA is generally detectable in upper respiratory  specimens  during the acute phase of infection. Positive results are indicative of the presence of the identified virus, but do not rule out bacterial infection or co-infection with other pathogens not detected by the test. Clinical correlation with patient history and other diagnostic information is necessary to determine patient infection status. The expected result is Negative. Fact Sheet for Patients:  PinkCheek.be Fact Sheet for Healthcare Providers: GravelBags.it This test is not yet approved or cleared by the [place] FDA and  has been authorized for detection and/or diagnosis of SARS-CoV-2 by FDA under an Emergency Use Authorization (EUA).  This EUA will remain in effect (meaning this test can be  used) for the duration of  the COVID-19 declaration under Section 564(b)(1) of the Act, 21 U.S.C. section 360bbb-3(b)(1), unless the authorization is terminated or revoked sooner.    Influenza A by PCR NEGATIVE NEGATIVE Final    Influenza B by PCR NEGATIVE NEGATIVE Final    Comment: (NOTE) The Xpert Xpress SARS-CoV-2/FLU/RSV assay is intended as an aid in  the diagnosis of influenza from Nasopharyngeal swab specimens and  should not be used as a sole basis for treatment. Nasal washings and  aspirates are unacceptable for Xpert Xpress SARS-CoV-2/FLU/RSV  testing. Fact Sheet for Patients: PinkCheek.be Fact Sheet for Healthcare Providers: GravelBags.it This test is not yet approved or cleared by the [place] FDA and  has been authorized for detection and/or diagnosis of SARS-CoV-2 by  FDA under an Emergency Use Authorization (EUA). This EUA will remain  in effect (meaning this test can be used) for the duration of the  Covid-19 declaration under Section 564(b)(1) of the Act, 21  U.S.C. section 360bbb-3(b)(1), unless the authorization is  terminated or revoked. Performed at [facility], [address], [place], [place] [address]      Scheduled Meds: . acetaminophen  650 mg Oral TID  . apixaban  2.5 mg Oral BID  . vitamin C  500 mg Oral Daily  . aspirin EC  81 mg Oral Q breakfast  . carvedilol  3.125 mg Oral BID WC  . cholecalciferol  1,000 Units Oral Daily  . famotidine  10 mg Oral Daily  . folic acid  1 mg Oral Daily  . guaiFENesin  600 mg Oral BID  . hydrALAZINE  37.5 mg Oral TID  . insulin aspart  0-5 Units Subcutaneous QHS  . insulin aspart  0-9 Units Subcutaneous TID WC  . isosorbide mononitrate  15 mg Oral Daily  . loratadine  10 mg Oral Daily  . multivitamin with minerals  1 tablet Oral Daily  . PARoxetine  20 mg Oral Daily  . rosuvastatin  10 mg Oral QPM  . sodium bicarbonate  650 mg Oral BID  . sodium chloride flush  3 mL Intravenous Q12H  . zinc sulfate  220 mg Oral Daily   Continuous Infusions: . sodium chloride      Procedures/Studies: DG Chest 2 View  Result Date: 12/15/2019 CLINICAL DATA:  Dyspnea, weakness, history of  COVID-19 EXAM: CHEST - 2 VIEW COMPARISON:  12/11/2019 chest radiograph. FINDINGS: Stable metallic device in the ventral medial right chest wall. Fixation screw overlies the left glenoid. Stable cardiomediastinal silhouette with normal heart size. No pneumothorax. Small right pleural effusion is decreased. Resolved left pleural effusion. Patchy hazy and reticular opacities in both lungs appear improved with significantly improved aeration at the lung bases. IMPRESSION: 1. Significantly improved aeration at the lung bases with persistent mild patchy hazy and reticular opacities in both  lungs, which could represent atypical pneumonia or nonspecific scarring. 2. Small right pleural effusion, decreased. Resolved left pleural effusion. Electronically Signed   By: Ilona Sorrel M.D.   On: 12/15/2019 10:53   DG Chest 2 View  Result Date: 12/11/2019 CLINICAL DATA:  Shortness of breath with atrial fibrillation and hypertension EXAM: CHEST - 2 VIEW COMPARISON:  December 09, 2019 chest radiograph and chest CT September 15, 2020 FINDINGS: Underlying emphysematous change is noted, most severe in the right upper lobe. There is a right pleural effusion with airspace opacity in the right lower lobe. There is atelectatic change in the left base. Heart is mildly enlarged. There is mild interstitial edema. Heart is slightly enlarged with pulmonary vascularity reflecting the underlying emphysema. There is aortic atherosclerosis. There is a loop recorder anteriorly. There is postoperative change in the left medial shoulder region. IMPRESSION: Findings most consistent with congestive heart failure superimposed on emphysematous change. Cardiomegaly is present with interstitial edema. Right pleural effusion. There is suspected superimposed right lower lobe pneumonia. There is aortic atherosclerosis. Aortic Atherosclerosis (ICD10-I70.0) and Emphysema (ICD10-J43.9). Electronically Signed   By: Lowella Grip III M.D.   On: 12/11/2019 11:14    DG Chest Portable 1 View  Result Date: 12/09/2019 CLINICAL DATA:  EMS reports pt is a residen at [facility]. Reports sob and cp since last night. Per Pts chart Pt has had COVID - unsure when. Hx A-fib, stroke, smoker EXAM: PORTABLE CHEST 1 VIEW COMPARISON:  09/15/2019 FINDINGS: Lungs density interstitial hazy airspace lung opacities, with more confluent opacity in the right lung base, obscuring the hemidiaphragm. Cardiac silhouette is normal in size.  Mediastinal hilar masses. Probable pleural effusions, larger on the right.  No pneumothorax. IMPRESSION: 1. Interstitial and hazy airspace lung opacities bilaterally with more confluent opacity noted in the right lung base. Findings may reflect multifocal pneumonia or pulmonary edema. Suspect right lung base opacity due to a combination of pleural fluid with either atelectasis or more confluent infection. Electronically Signed   By: Lajean Manes M.D.   On: 12/09/2019 11:34   ECHOCARDIOGRAM COMPLETE  Result Date: 12/09/2019    ECHOCARDIOGRAM REPORT   Patient Name:   KYRE JEFFRIES Date of Exam: 12/09/2019 Medical Rec #:  [id number]   Height:       63.0 in Accession #:    [id number]  Weight:       125.0 lb Date of Birth:  08-01-1946  BSA:          1.584 m Patient Age:    30 years    BP:           140/78 mmHg Patient Gender: M           HR:           90 bpm. Exam Location:  [facility] Procedure: 2D Echo Indications:    Dyspnea 786.09 / R06.00  History:        Patient has prior history of Echocardiogram examinations, most                 recent 09/17/2019. Stroke, Arrythmias:Atrial Fibrillation; Risk                 Factors:Current Smoker. Rhabdomyolysis, Pneumonia.  Sonographer:    Leavy Cella RDCS (AE) Referring Phys: [id number] [place] IMPRESSIONS  1. Left ventricular ejection fraction, by estimation, is 40%. The left ventricle has moderately decreased function. The left ventricle demonstrates global hypokinesis. Left ventricular diastolic parameters are  consistent with Grade  II diastolic dysfunction (pseudonormalization).  2. Right ventricular systolic function is normal. The right ventricular size is normal. There is moderately elevated pulmonary artery systolic pressure.  3. The mitral valve is grossly normal. Mild mitral valve regurgitation.  4. Morphologically, there appears to be mild aortic stenosis. The aortic valve is tricuspid. Aortic valve regurgitation is not visualized.  5. The inferior vena cava is normal in size with greater than 50% respiratory variability, suggesting right atrial pressure of 3 mmHg. FINDINGS  Left Ventricle: Left ventricular ejection fraction, by estimation, is 40%. The left ventricle has moderately decreased function. The left ventricle demonstrates global hypokinesis. The left ventricular internal cavity size was normal in size. There is no left ventricular hypertrophy. Left ventricular diastolic parameters are consistent with Grade II diastolic dysfunction (pseudonormalization). Indeterminate filling pressures. Right Ventricle: The right ventricular size is normal. No increase in right ventricular wall thickness. Right ventricular systolic function is normal. There is moderately elevated pulmonary artery systolic pressure. The tricuspid regurgitant velocity is 3.08 m/s, and with an assumed right atrial pressure of 10 mmHg, the estimated right ventricular systolic pressure is 42.3 mmHg. Left Atrium: Left atrial size was normal in size. Right Atrium: Right atrial size was normal in size. Pericardium: There is no evidence of pericardial effusion. Mitral Valve: The mitral valve is grossly normal. Mild mitral valve regurgitation. Tricuspid Valve: The tricuspid valve is grossly normal. Tricuspid valve regurgitation is mild. Aortic Valve: Morphologically, there appears to be mild aortic stenosis. The aortic valve is tricuspid. . There is mild thickening and mild calcification of the aortic valve. Aortic valve regurgitation is not  visualized. Mild aortic valve annular calcification. There is mild thickening of the aortic valve. There is mild calcification of the aortic valve. Pulmonic Valve: The pulmonic valve was grossly normal. Pulmonic valve regurgitation is not visualized. Aorta: The aortic root is normal in size and structure. Venous: The inferior vena cava is normal in size with greater than 50% respiratory variability, suggesting right atrial pressure of 3 mmHg. IAS/Shunts: No atrial level shunt detected by color flow Doppler.  LEFT VENTRICLE PLAX 2D LVIDd:         4.76 cm  Diastology LVIDs:         4.10 cm  LV e' lateral:   6.96 cm/s LV PW:         0.84 cm  LV E/e' lateral: 9.4 LV IVS:        0.79 cm  LV e' medial:    5.98 cm/s LVOT diam:     2.10 cm  LV E/e' medial:  11.0 LVOT Area:     3.46 cm  RIGHT VENTRICLE RV S prime:     11.60 cm/s TAPSE (M-mode): 1.7 cm LEFT ATRIUM             Index       RIGHT ATRIUM           Index LA diam:        4.00 cm 2.53 cm/m  RA Area:     13.90 cm LA Vol (A2C):   74.9 ml 47.30 ml/m RA Volume:   37.30 ml  23.55 ml/m LA Vol (A4C):   42.6 ml 26.90 ml/m LA Biplane Vol: 56.1 ml 35.43 ml/m   AORTA Ao Root diam: 2.80 cm MITRAL VALVE               TRICUSPID VALVE MV Area (PHT): 2.91 cm    TR Peak grad:   37.9 mmHg MV Decel  Time: 261 msec    TR Vmax:        308.00 cm/s MR Peak grad: 42.2 mmHg MR Mean grad: 29.0 mmHg    SHUNTS MR Vmax:      325.00 cm/s  Systemic Diam: 2.10 cm MR Vmean:     254.0 cm/s MV E velocity: 65.50 cm/s MV A velocity: 41.70 cm/s MV E/A ratio:  1.57 Kate Sable MD Electronically signed by Kate Sable MD Signature Date/Time: 12/09/2019/3:20:59 PM    Final    ECHOCARDIOGRAM LIMITED  Result Date: 12/11/2019    ECHOCARDIOGRAM LIMITED REPORT   Patient Name:   JACCOB CZAPLICKI Date of Exam: 12/11/2019 Medical Rec #:  [id number]   Height:       63.0 in Accession #:    [id number]  Weight:       119.6 lb Date of Birth:  20-Jul-1946  BSA:          1.554 m Patient Age:    8 years     BP:           114/81 mmHg Patient Gender: M           HR:           73 bpm. Exam Location:  [facility] Procedure: Limited Echo Indications:    Dyspnea 786.09 / R06.00  History:        Patient has prior history of Echocardiogram examinations, most                 recent 12/09/2019. Previous Myocardial Infarction,                 Arrythmias:Atrial Fibrillation; Risk Factors:Current Smoker and                 Hypertension. Rhabdomyolysis.  Sonographer:    Leavy Cella RDCS (AE) Referring Phys: [id number] [place]  1. Left ventricular ejection fraction, by estimation, is 40%. The left ventricle demonstrates global hypokinesis.  2. The inferior vena cava is normal in size with greater than 50% respiratory variability, suggesting right atrial pressure of 3 mmHg.  3. Limited echo to evaluate LV function. Echocontrast was used to enhance visualization. FINDINGS  Left Ventricle: Left ventricular ejection fraction, by estimation, is 40%. The left ventricle demonstrates global hypokinesis. Definity contrast agent was given IV to delineate the left ventricular endocardial borders. Pericardium: Trivial pericardial effusion is present. The pericardial effusion is circumferential. Venous: The inferior vena cava is normal in size with greater than 50% respiratory variability, suggesting right atrial pressure of 3 mmHg. Carlyle Dolly MD Electronically signed by Carlyle Dolly MD Signature Date/Time: 12/11/2019/3:12:49 PM    Final     Orson Eva, DO  [facility]  If 7PM-7AM, please contact night-coverage [email or web address] Password [facility] 12/19/2019, 11:47 AM   LOS: 10 days

## 2019-12-19 NOTE — Progress Notes (Signed)
Physical Therapy Treatment Patient Details Name: Anthony Andrews MRN: [id number] DOB: 1946/03/21 Today's Date: 12/19/2019    History of Present Illness Anthony Andrews  is a 74 y.o. male with PMHx of Prior CVA with mild residual left-sided weakness, HTN, ongoing tobacco abuse, PAF on chronic anticoagulation with Eliquis, chronic anemia, recurrent falls syncope and status post 30-day event monitor placed on 09/16/2020 who presents from [facility] with shortness of breath    PT Comments    Patient demonstrates fair/good sitting balance when completing BLE ROM/strengthening exercises while seated at bedside, c/o minor stomach discomfort due to taking medications, limited to ambulating to door and back to bedside secondary to c/o fatigue and tolerated sitting up in chair after therapy - RN notified.  Patient will benefit from continued physical therapy in hospital and recommended venue below to increase strength, balance, endurance for safe ADLs and gait.   Follow Up Recommendations  SNF     Equipment Recommendations  None recommended by PT    Recommendations for Other Services       Precautions / Restrictions Precautions Precautions: Fall Restrictions Weight Bearing Restrictions: No    Mobility  Bed Mobility Overal bed mobility: Needs Assistance Bed Mobility: Supine to Sit     Supine to sit: Supervision     General bed mobility comments: slightly increased time, labored movement  Transfers Overall transfer level: Needs assistance Equipment used: Rolling walker (2 wheeled) Transfers: Sit to/from [facility] Sit to Stand: Min guard;Min assist Stand pivot transfers: Min guard;Min assist       General transfer comment: increased time, labored movement  Ambulation/Gait Ambulation/Gait assistance: Min assist Gait Distance (Feet): 20 Feet Assistive device: Rolling walker (2 wheeled) Gait Pattern/deviations: Step-through pattern;Decreased step length -  left;Decreased step length - right;Decreased stride length Gait velocity: decreased   General Gait Details: slow slightly labored unsteady cadence without loss of balance, limited secondary to c/o fatigue   Stairs             Wheelchair Mobility    Modified Rankin (Stroke Patients Only)       Balance Overall balance assessment: Needs assistance Sitting-balance support: Feet supported;No upper extremity supported Sitting balance-Leahy Scale: Fair Sitting balance - Comments: fair/good seated at EOB   Standing balance support: During functional activity;Bilateral upper extremity supported Standing balance-Leahy Scale: Fair Standing balance comment: using RW                            Cognition Arousal/Alertness: Awake/alert Behavior During Therapy: WFL for tasks assessed/performed Overall Cognitive Status: Within Functional Limits for tasks assessed                                        Exercises General Exercises - Lower Extremity Long Arc Quad: Seated;AROM;Strengthening;Both;10 reps Hip Flexion/Marching: Seated;AROM;Strengthening;Both;10 reps Toe Raises: Seated;AROM;Both;10 reps;Strengthening Heel Raises: Seated;AROM;Strengthening;Both;10 reps    General Comments        Pertinent Vitals/Pain Pain Assessment: Faces Faces Pain Scale: Hurts a little bit Pain Location: stomach Pain Descriptors / Indicators: Discomfort;Aching Pain Intervention(s): Limited activity within patient's tolerance;Monitored during session    Home Living                      Prior Function            PT Goals (current goals can now be  found in the care plan section) Acute Rehab PT Goals Patient Stated Goal: return home PT Goal Formulation: With patient Time For Goal Achievement: 12/25/19 Potential to Achieve Goals: Good Progress towards PT goals: Progressing toward goals    Frequency    Min 3X/week      PT Plan Current plan remains  appropriate    Co-evaluation              AM-PAC PT "6 Clicks" Mobility   Outcome Measure  Help needed turning from your back to your side while in a flat bed without using bedrails?: None Help needed moving from lying on your back to sitting on the side of a flat bed without using bedrails?: A Little Help needed moving to and from a bed to a chair (including a wheelchair)?: A Little Help needed standing up from a chair using your arms (e.g., wheelchair or bedside chair)?: A Little Help needed to walk in hospital room?: A Lot Help needed climbing 3-5 steps with a railing? : A Lot 6 Click Score: 17    End of Session   Activity Tolerance: Patient tolerated treatment well;Patient limited by fatigue Patient left: in chair;with call bell/phone within reach;with chair alarm set Nurse Communication: Mobility status PT Visit Diagnosis: Unsteadiness on feet (R26.81);Other abnormalities of gait and mobility (R26.89);Muscle weakness (generalized) (M62.81)     Time: 9450-3888 PT Time Calculation (min) (ACUTE ONLY): 31 min  Charges:  $Therapeutic Exercise: 8-22 mins $Therapeutic Activity: 8-22 mins                     12:12 PM, 12/19/19 [name], MPT Physical Therapist with [facility] [phone] office [phone] mobile phone

## 2019-12-20 LAB — BASIC METABOLIC PANEL
Anion gap: 7 (ref 5–15)
BUN: 40 mg/dL — ABNORMAL HIGH (ref 8–23)
CO2: 23 mmol/L (ref 22–32)
Calcium: 9.7 mg/dL (ref 8.9–10.3)
Chloride: 100 mmol/L (ref 98–111)
Creatinine, Ser: 2.01 mg/dL — ABNORMAL HIGH (ref 0.61–1.24)
GFR calc Af Amer: 38 mL/min — ABNORMAL LOW (ref >60)
GFR calc non Af Amer: 32 mL/min — ABNORMAL LOW (ref >60)
Glucose, Bld: 87 mg/dL (ref 70–99)
Potassium: 4.1 mmol/L (ref 3.5–5.1)
Sodium: 130 mmol/L — ABNORMAL LOW (ref 135–145)

## 2019-12-20 MED ORDER — FUROSEMIDE 40 MG PO TABS
40.0000 mg | ORAL_TABLET | Freq: Every day | ORAL | Status: DC
  Administered 2019-12-20 – 2019-12-21 (×2): 40 mg via ORAL
  Filled 2019-12-20 (×2): qty 1

## 2019-12-20 NOTE — Progress Notes (Signed)
PROGRESS NOTE  Anthony Andrews ZRA:[id number] DOB: 1946/08/23 DOA: 12/09/2019 PCP: [name], MD  Brief History:  74 year old male with a history of stroke with residual left hemiparesis, hypertension, tobacco abuse, COPD, paroxysmal atrial fibrillation, anemia of chronic disease, hyperlipidemia presenting with shortness of breath and wheezing with nonproductive cough for 1 day. Apparently the patient was having chest discomfort at that time. Upon EMS arrival, the patient's oxygen saturation was 90% on room air. He was placed on 4 L. Since admission, the patient was noted to have elevated troponin peaking at 4800. The patient was started on IV heparin which has been since transition to oral apixaban. Cardiology was consulted and followed the patient. The patient was treated for acute on chronic systolic and diastolic CHF with IV furosemide. He has improved clinically and has been since weaned off of oxygen. Notably, the patient had already received his 2 doses of the COVID-19 vaccination with his last dose sometime in February 2021. In addition, the patient was tested positive for COVID-19 in January 2021. Nevertheless, the patient had a negativeSARS-CoV2 RT-PCR on 12/09/19. As part of the screening process for SNF placement, he tested positive on SARS-CoV2 RT-PCR. Fortunately, the patient remained essentially asymptomatic and stable on RA. Unfortunately, his discharge was delayed until 12/18/19 due to social work issue.  Assessment/Plan: Acute respiratory failure with hypoxia -Secondary to CHF and pneumonia -Patient has been weaned to room air-stable on RA   Acute on chronic systolic and diastolic CHF -5/45/6256 echo EF 40%, grade 2 DD, mild AS -Restart furosemide 40 mg p.o. daily -Daily weights - baseline weight119-125 lbs, admitted 132 lbs and BNP 3459 and CXR consistent with edema - standing weight down to 119 lbs this admission. -Restarting oral  furosemide3/24-->serum creatinine uptrend to 2.40 on 3/25 -clinically euvolemic now -continue coreg, hydralazine and imdur  Pulmonary infiltrate/pneumonia -Patient finished 7 days of ceftriaxone and doxycycline -afebrile, hemodynamically stable on RA on day of d/c  COVID-19 infection -12/09/2019 COVID-19 RT-PCR negative -12/16/2019 COVID-19 RT-PCR positive -Patient currently is essentially asymptomaticand stable on RA -No indication for steroids or remdesivir presently  Elevated troponin -Personally reviewed EKG--nonspecific ST-T wave changes -No chest pain presently -Appreciate cardiology follow-up--outpt cath once recovered from COVID-19 -Patient initially on IV heparin -Now transitioned to apixaban  Paroxysmal atrial fibrillation -Rate control -in sinus presently -Continue apixaban  Acute on chronic renal failure--CKD stage IIIa -Baseline creatinine 1.2-1.4 -serum creatinine 2.40 peak -held lasix x 3 days -restart lasix 3/28  Anemia of chronic disease -No evidence of ongoing bleeding, PTA the patient was onEliquis, -restart Eliquis -Stool occult blood Negative -Hgb stable -baseline Hgb 8-9  history of second-degree AV block/history of tachyarrhythmia  -patient had a 30-day event monitor placed on 11/18/2019 -Preliminary review of his 30-day event monitor shows no significant arrhythmias -Continue Coreg,stopIV heparin for now -Restart Eliquis  Generalized Weakness/deconditioning- -PT eval appreciated recommends SNF rehab  Tobacco abuse- -smoking cessation strongly advised, patient is not ready to quit       Disposition Plan: Patient From: Home D/C Place: SNF Barriers: waiting for [facility] number  Family Communication:  No  Family at bedside  Consultants:  none  Code Status:  DNR  DVT Prophylaxis:  SCDs   Procedures: As Listed in Progress Note Above  Antibiotics: None    Subjective: Patient denies fevers,  chills, headache, chest pain, dyspnea, nausea, vomiting, diarrhea, abdominal pain, dysuria, hematuria, hematochezia, and melena.   Objective: Vitals:   12/19/19 1351  12/19/19 1721 12/19/19 2141 12/20/19 0440  BP: 119/64 128/75 116/61 111/72  Pulse: 61 66 67 66  Resp: 20 18 17 16   Temp: 98 F (36.7 C)  (!) 97.5 F (36.4 C) 97.7 F (36.5 C)  TempSrc: Oral  Oral Oral  SpO2: 100% 99% 99% 99%  Weight:    53.6 kg  Height:        Intake/Output Summary (Last 24 hours) at 12/20/2019 0748 Last data filed at 12/20/2019 0400 Gross per 24 hour  Intake 830 ml  Output 1250 ml  Net -420 ml   Weight change: 0 kg Exam:   General:  Pt is alert, follows commands appropriately, not in acute distress  HEENT: No icterus, No thrush, No neck mass, [place]/AT  Cardiovascular: RRR, S1/S2, no rubs, no gallops  Respiratory:diminished breath sounds.  Bibasilar rales.  Abdomen: Soft/+BS, non tender, non distended, no guarding  Extremities: No edema, No lymphangitis, No petechiae, No rashes, no synovitis   Data Reviewed: I have personally reviewed following labs and imaging studies Basic Metabolic Panel: Recent Labs  Lab 12/14/19 0445 12/15/19 0832 12/16/19 0517 12/17/19 0547 12/18/19 0627 12/19/19 0644 12/20/19 0629  NA 133*   < > 130* 130* 130* 132* 130*  K 4.1   < > 4.6 3.8 4.1 4.4 4.1  CL 105   < > 103 103 101 101 100  CO2 20*   < > 18* 21* 23 24 23   GLUCOSE 86   < > 117* 89 86 90 87  BUN 35*   < > 35* 41* 43* 42* 40*  CREATININE 2.32*   < > 2.17* 2.40* 2.14* 2.18* 2.01*  CALCIUM 9.1   < > 9.4 9.5 9.6 10.0 9.7  MG  --   --  2.1 2.0 2.0  --   --   PHOS 2.7  --  3.7 3.0 2.8  --   --    < > = values in this interval not displayed.   Liver Function Tests: Recent Labs  Lab 12/14/19 0445 12/16/19 0517 12/17/19 0547 12/18/19 0627  AST  --  18 23 18   ALT  --  16 17 16   ALKPHOS  --  64 58 58  BILITOT  --  0.5 0.3 0.4  PROT  --  5.9* 5.4* 5.5*  ALBUMIN 2.6* 2.6* 2.7* 2.7*   No  results for input(s): LIPASE, AMYLASE in the last 168 hours. No results for input(s): AMMONIA in the last 168 hours. Coagulation Profile: No results for input(s): INR, PROTIME in the last 168 hours. CBC: Recent Labs  Lab 12/14/19 0445 12/15/19 0437 12/16/19 0517 12/17/19 0547 12/18/19 0627  WBC 7.0 6.7 4.3 8.3 9.6  NEUTROABS  --   --  3.2 4.3 5.2  HGB 8.9* 8.8* 9.1* 8.5* 8.9*  HCT 26.4* 27.3* 28.9* 26.5* 27.3*  MCV 97.8 98.9 97.3 98.1 96.8  PLT 442* 376 394 358 353   Cardiac Enzymes: No results for input(s): CKTOTAL, CKMB, CKMBINDEX, TROPONINI in the last 168 hours. BNP: Invalid input(s): POCBNP CBG: Recent Labs  Lab 12/18/19 1123 12/18/19 1716 12/18/19 2124 12/19/19 0817 12/19/19 1122  GLUCAP 81 87 81 89 92   HbA1C: No results for input(s): HGBA1C in the last 72 hours. Urine analysis:    Component Value Date/Time   COLORURINE YELLOW 09/12/2019 0153   APPEARANCEUR HAZY (A) 09/12/2019 0153   LABSPEC 1.015 09/12/2019 0153   PHURINE 6.0 09/12/2019 0153   GLUCOSEU NEGATIVE 09/12/2019 0153   HGBUR NEGATIVE 09/12/2019 0153  BILIRUBINUR NEGATIVE 09/12/2019 0153   KETONESUR 5 (A) 09/12/2019 0153   PROTEINUR >=300 (A) 09/12/2019 0153   NITRITE NEGATIVE 09/12/2019 0153   LEUKOCYTESUR NEGATIVE 09/12/2019 0153   Sepsis Labs: @LABRCNTIP (procalcitonin:4,lacticidven:4) ) Recent Results (from the past 240 hour(s))  Respiratory Panel by RT PCR (Flu A&B, Covid) - Nasopharyngeal Swab     Status: Abnormal   Collection Time: 12/15/19 11:15 AM   Specimen: Nasopharyngeal Swab  Result Value Ref Range Status   SARS Coronavirus 2 by RT PCR POSITIVE (A) NEGATIVE Final    Comment: RESULT CALLED TO, READ BACK BY AND VERIFIED WITH: BARKER B. AT 1254 ON [id number] BY THOMPSON S. (NOTE) SARS-CoV-2 target nucleic acids are DETECTED. SARS-CoV-2 RNA is generally detectable in upper respiratory specimens  during the acute phase of infection. Positive results are indicative of the presence  of the identified virus, but do not rule out bacterial infection or co-infection with other pathogens not detected by the test. Clinical correlation with patient history and other diagnostic information is necessary to determine patient infection status. The expected result is Negative. Fact Sheet for Patients:  PinkCheek.be Fact Sheet for Healthcare Providers: GravelBags.it This test is not yet approved or cleared by the [place] FDA and  has been authorized for detection and/or diagnosis of SARS-CoV-2 by FDA under an Emergency Use Authorization (EUA).  This EUA will remain in effect (meaning this test can be  used) for the duration of  the COVID-19 declaration under Section 564(b)(1) of the Act, 21 U.S.C. section 360bbb-3(b)(1), unless the authorization is terminated or revoked sooner.    Influenza A by PCR NEGATIVE NEGATIVE Final   Influenza B by PCR NEGATIVE NEGATIVE Final    Comment: (NOTE) The Xpert Xpress SARS-CoV-2/FLU/RSV assay is intended as an aid in  the diagnosis of influenza from Nasopharyngeal swab specimens and  should not be used as a sole basis for treatment. Nasal washings and  aspirates are unacceptable for Xpert Xpress SARS-CoV-2/FLU/RSV  testing. Fact Sheet for Patients: PinkCheek.be Fact Sheet for Healthcare Providers: GravelBags.it This test is not yet approved or cleared by the [place] FDA and  has been authorized for detection and/or diagnosis of SARS-CoV-2 by  FDA under an Emergency Use Authorization (EUA). This EUA will remain  in effect (meaning this test can be used) for the duration of the  Covid-19 declaration under Section 564(b)(1) of the Act, 21  U.S.C. section 360bbb-3(b)(1), unless the authorization is  terminated or revoked. Performed at [facility], [address], [place], [place] [address]      Scheduled Meds: .  acetaminophen  650 mg Oral TID  . apixaban  2.5 mg Oral BID  . vitamin C  500 mg Oral Daily  . aspirin EC  81 mg Oral Q breakfast  . carvedilol  3.125 mg Oral BID WC  . cholecalciferol  1,000 Units Oral Daily  . famotidine  10 mg Oral Daily  . folic acid  1 mg Oral Daily  . guaiFENesin  600 mg Oral BID  . hydrALAZINE  37.5 mg Oral TID  . isosorbide mononitrate  15 mg Oral Daily  . loratadine  10 mg Oral Daily  . multivitamin with minerals  1 tablet Oral Daily  . PARoxetine  20 mg Oral Daily  . polyethylene glycol  17 g Oral Daily  . rosuvastatin  10 mg Oral QPM  . senna  2 tablet Oral Daily  . sodium bicarbonate  650 mg Oral BID  . sodium chloride flush  3 mL Intravenous Q12H  . zinc sulfate  220 mg Oral Daily   Continuous Infusions: . sodium chloride      Procedures/Studies: DG Chest 2 View  Result Date: 12/15/2019 CLINICAL DATA:  Dyspnea, weakness, history of COVID-19 EXAM: CHEST - 2 VIEW COMPARISON:  12/11/2019 chest radiograph. FINDINGS: Stable metallic device in the ventral medial right chest wall. Fixation screw overlies the left glenoid. Stable cardiomediastinal silhouette with normal heart size. No pneumothorax. Small right pleural effusion is decreased. Resolved left pleural effusion. Patchy hazy and reticular opacities in both lungs appear improved with significantly improved aeration at the lung bases. IMPRESSION: 1. Significantly improved aeration at the lung bases with persistent mild patchy hazy and reticular opacities in both lungs, which could represent atypical pneumonia or nonspecific scarring. 2. Small right pleural effusion, decreased. Resolved left pleural effusion. Electronically Signed   By: Ilona Sorrel M.D.   On: 12/15/2019 10:53   DG Chest 2 View  Result Date: 12/11/2019 CLINICAL DATA:  Shortness of breath with atrial fibrillation and hypertension EXAM: CHEST - 2 VIEW COMPARISON:  December 09, 2019 chest radiograph and chest CT September 15, 2020 FINDINGS:  Underlying emphysematous change is noted, most severe in the right upper lobe. There is a right pleural effusion with airspace opacity in the right lower lobe. There is atelectatic change in the left base. Heart is mildly enlarged. There is mild interstitial edema. Heart is slightly enlarged with pulmonary vascularity reflecting the underlying emphysema. There is aortic atherosclerosis. There is a loop recorder anteriorly. There is postoperative change in the left medial shoulder region. IMPRESSION: Findings most consistent with congestive heart failure superimposed on emphysematous change. Cardiomegaly is present with interstitial edema. Right pleural effusion. There is suspected superimposed right lower lobe pneumonia. There is aortic atherosclerosis. Aortic Atherosclerosis (ICD10-I70.0) and Emphysema (ICD10-J43.9). Electronically Signed   By: Lowella Grip III M.D.   On: 12/11/2019 11:14   DG Chest Portable 1 View  Result Date: 12/09/2019 CLINICAL DATA:  EMS reports pt is a residen at [facility]. Reports sob and cp since last night. Per Pts chart Pt has had COVID - unsure when. Hx A-fib, stroke, smoker EXAM: PORTABLE CHEST 1 VIEW COMPARISON:  09/15/2019 FINDINGS: Lungs density interstitial hazy airspace lung opacities, with more confluent opacity in the right lung base, obscuring the hemidiaphragm. Cardiac silhouette is normal in size.  Mediastinal hilar masses. Probable pleural effusions, larger on the right.  No pneumothorax. IMPRESSION: 1. Interstitial and hazy airspace lung opacities bilaterally with more confluent opacity noted in the right lung base. Findings may reflect multifocal pneumonia or pulmonary edema. Suspect right lung base opacity due to a combination of pleural fluid with either atelectasis or more confluent infection. Electronically Signed   By: Lajean Manes M.D.   On: 12/09/2019 11:34   ECHOCARDIOGRAM COMPLETE  Result Date: 12/09/2019    ECHOCARDIOGRAM REPORT   Patient Name:    Anthony Andrews Date of Exam: 12/09/2019 Medical Rec #:  [id number]   Height:       63.0 in Accession #:    [id number]  Weight:       125.0 lb Date of Birth:  06-15-1946  BSA:          1.584 m Patient Age:    64 years    BP:           140/78 mmHg Patient Gender: M           HR:  90 bpm. Exam Location:  [facility] Procedure: 2D Echo Indications:    Dyspnea 786.09 / R06.00  History:        Patient has prior history of Echocardiogram examinations, most                 recent 09/17/2019. Stroke, Arrythmias:Atrial Fibrillation; Risk                 Factors:Current Smoker. Rhabdomyolysis, Pneumonia.  Sonographer:    Leavy Cella RDCS (AE) Referring Phys: [id number] [place] IMPRESSIONS  1. Left ventricular ejection fraction, by estimation, is 40%. The left ventricle has moderately decreased function. The left ventricle demonstrates global hypokinesis. Left ventricular diastolic parameters are consistent with Grade II diastolic dysfunction (pseudonormalization).  2. Right ventricular systolic function is normal. The right ventricular size is normal. There is moderately elevated pulmonary artery systolic pressure.  3. The mitral valve is grossly normal. Mild mitral valve regurgitation.  4. Morphologically, there appears to be mild aortic stenosis. The aortic valve is tricuspid. Aortic valve regurgitation is not visualized.  5. The inferior vena cava is normal in size with greater than 50% respiratory variability, suggesting right atrial pressure of 3 mmHg. FINDINGS  Left Ventricle: Left ventricular ejection fraction, by estimation, is 40%. The left ventricle has moderately decreased function. The left ventricle demonstrates global hypokinesis. The left ventricular internal cavity size was normal in size. There is no left ventricular hypertrophy. Left ventricular diastolic parameters are consistent with Grade II diastolic dysfunction (pseudonormalization). Indeterminate filling pressures. Right Ventricle: The right  ventricular size is normal. No increase in right ventricular wall thickness. Right ventricular systolic function is normal. There is moderately elevated pulmonary artery systolic pressure. The tricuspid regurgitant velocity is 3.08 m/s, and with an assumed right atrial pressure of 10 mmHg, the estimated right ventricular systolic pressure is 62.0 mmHg. Left Atrium: Left atrial size was normal in size. Right Atrium: Right atrial size was normal in size. Pericardium: There is no evidence of pericardial effusion. Mitral Valve: The mitral valve is grossly normal. Mild mitral valve regurgitation. Tricuspid Valve: The tricuspid valve is grossly normal. Tricuspid valve regurgitation is mild. Aortic Valve: Morphologically, there appears to be mild aortic stenosis. The aortic valve is tricuspid. . There is mild thickening and mild calcification of the aortic valve. Aortic valve regurgitation is not visualized. Mild aortic valve annular calcification. There is mild thickening of the aortic valve. There is mild calcification of the aortic valve. Pulmonic Valve: The pulmonic valve was grossly normal. Pulmonic valve regurgitation is not visualized. Aorta: The aortic root is normal in size and structure. Venous: The inferior vena cava is normal in size with greater than 50% respiratory variability, suggesting right atrial pressure of 3 mmHg. IAS/Shunts: No atrial level shunt detected by color flow Doppler.  LEFT VENTRICLE PLAX 2D LVIDd:         4.76 cm  Diastology LVIDs:         4.10 cm  LV e' lateral:   6.96 cm/s LV PW:         0.84 cm  LV E/e' lateral: 9.4 LV IVS:        0.79 cm  LV e' medial:    5.98 cm/s LVOT diam:     2.10 cm  LV E/e' medial:  11.0 LVOT Area:     3.46 cm  RIGHT VENTRICLE RV S prime:     11.60 cm/s TAPSE (M-mode): 1.7 cm LEFT ATRIUM  Index       RIGHT ATRIUM           Index LA diam:        4.00 cm 2.53 cm/m  RA Area:     13.90 cm LA Vol (A2C):   74.9 ml 47.30 ml/m RA Volume:   37.30 ml  23.55  ml/m LA Vol (A4C):   42.6 ml 26.90 ml/m LA Biplane Vol: 56.1 ml 35.43 ml/m   AORTA Ao Root diam: 2.80 cm MITRAL VALVE               TRICUSPID VALVE MV Area (PHT): 2.91 cm    TR Peak grad:   37.9 mmHg MV Decel Time: 261 msec    TR Vmax:        308.00 cm/s MR Peak grad: 42.2 mmHg MR Mean grad: 29.0 mmHg    SHUNTS MR Vmax:      325.00 cm/s  Systemic Diam: 2.10 cm MR Vmean:     254.0 cm/s MV E velocity: 65.50 cm/s MV A velocity: 41.70 cm/s MV E/A ratio:  1.57 Kate Sable MD Electronically signed by Kate Sable MD Signature Date/Time: 12/09/2019/3:20:59 PM    Final    ECHOCARDIOGRAM LIMITED  Result Date: 12/11/2019    ECHOCARDIOGRAM LIMITED REPORT   Patient Name:   Anthony Andrews Date of Exam: 12/11/2019 Medical Rec #:  [id number]   Height:       63.0 in Accession #:    [id number]  Weight:       119.6 lb Date of Birth:  [birth year]  BSA:          1.554 m Patient Age:    17 years    BP:           114/81 mmHg Patient Gender: M           HR:           73 bpm. Exam Location:  [facility] Procedure: Limited Echo Indications:    Dyspnea 786.09 / R06.00  History:        Patient has prior history of Echocardiogram examinations, most                 recent 12/09/2019. Previous Myocardial Infarction,                 Arrythmias:Atrial Fibrillation; Risk Factors:Current Smoker and                 Hypertension. Rhabdomyolysis.  Sonographer:    Leavy Cella RDCS (AE) Referring Phys: [id number] [place]  1. Left ventricular ejection fraction, by estimation, is 40%. The left ventricle demonstrates global hypokinesis.  2. The inferior vena cava is normal in size with greater than 50% respiratory variability, suggesting right atrial pressure of 3 mmHg.  3. Limited echo to evaluate LV function. Echocontrast was used to enhance visualization. FINDINGS  Left Ventricle: Left ventricular ejection fraction, by estimation, is 40%. The left ventricle demonstrates global hypokinesis. Definity contrast agent was  given IV to delineate the left ventricular endocardial borders. Pericardium: Trivial pericardial effusion is present. The pericardial effusion is circumferential. Venous: The inferior vena cava is normal in size with greater than 50% respiratory variability, suggesting right atrial pressure of 3 mmHg. Carlyle Dolly MD Electronically signed by Carlyle Dolly MD Signature Date/Time: 12/11/2019/3:12:49 PM    Final     Orson Eva, DO  [facility]  If 7PM-7AM, please contact night-coverage [email or web address] Password [facility] 12/20/2019, 7:48 AM  LOS: 11 days

## 2019-12-20 NOTE — Progress Notes (Signed)
CSW has reviewed chart and see that patient is ready for discharge pending PASRR number from [place]. Patient will be discharging to [facility] pending Level II PASRR number. Per [place] [facility], PASRR screen is still pending , [facility] evaluation verification. CSW has made nursing staff aware of discharge barriers.   TOC team will continue to follow patient for discharge related needs  [facility]  Clinical Social Worker  Ph: [phone]

## 2019-12-20 NOTE — Discharge Summary (Signed)
Physician Discharge Summary  Anthony Andrews JIR:[id number] DOB: 07-26-1946 DOA: 12/09/2019  PCP: [name], MD  Admit date: 12/09/2019 Discharge date: 12/21/2019  Admitted From: Home Disposition:  SNF  Recommendations for Outpatient Follow-up:  1. Follow up with PCP in 1-2 weeks 2. Please obtain BMP on 12/24/19     Discharge Condition: Stable CODE STATUS:DNR Diet recommendation: Heart Healthy    Brief/Interim Summary: 74 year old male with a history of stroke with residual left hemiparesis, hypertension, tobacco abuse, COPD, paroxysmal atrial fibrillation, anemia of chronic disease, hyperlipidemia presenting with shortness of breath and wheezing with nonproductive cough for 1 day. Apparently the patient was having chest discomfort at that time. Upon EMS arrival, the patient's oxygen saturation was 90% on room air. He was placed on 4 L. Since admission, the patient was noted to have elevated troponin peaking at 4800. The patient was started on IV heparin which has been since transition to oral apixaban. Cardiology was consulted and followed the patient. The patient was treated for acute on chronic systolic and diastolic CHF with IV furosemide. He has improved clinically and has been since weaned off of oxygen. Notably, the patient had already received his 2 doses of the COVID-19 vaccination with his last dose sometime in February 2021. In addition, the patient was tested positive for COVID-19 in January 2021. Nevertheless, the patient had a negativeSARS-CoV2 RT-PCR on 12/09/19. As part of the screening process for SNF placement, he tested positive on SARS-CoV2 RT-PCR. Fortunately, the patient remained essentially asymptomatic and stable on RA. Unfortunately, his discharge was delayed due to delay in obtaining PASSARR number  Discharge Diagnoses:   Acute respiratory failure with hypoxia -Secondary to CHF and pneumonia -Patient has been weaned to room air--remains stable on RA    Acute on chronic systolic and diastolic CHF -7/51/0258 echo EF 40%, grade 2 DD, mild AS -Restart furosemide 60 mg p.o. daily -Daily weights - baseline weight119-125 lbs, admitted 132 lbs and BNP 3459 and CXR consistent with edema - standing weight down to 118 lbs this admission. -Restarting oral furosemide3/28-->serum creatinine uptrend to 2.40 on 3/25 -clinically euvolemic now -continue coreg, hydralazine and imdur  Pulmonary infiltrate/pneumonia -Patient finished 7 days of ceftriaxone and doxycycline -afebrile, hemodynamically stable on RA on day of d/c  COVID-19 infection -12/09/2019 COVID-19 RT-PCR negative -12/16/2019 COVID-19 RT-PCR positive -Patient currently is essentially asymptomaticand stable on RA -No indication for steroids or remdesivir presently  Elevated troponin -Personally reviewed EKG--nonspecific ST-T wave changes -No chest pain presently -Appreciate cardiology follow-up--outpt cath once recovered from COVID-19 -Patient initially on IV heparin -Now transitionedto apixaban  Paroxysmal atrial fibrillation -Rate control -in sinus presently -Continue apixaban  Acute on chronic renal failure--CKD stage IIIa -Baseline creatinine 1.2-1.4 -serum creatinine 2.40 peak -held lasix x 3 days -restart lasix 3/28 -BMP on 12/24/19  Anemia of chronic disease -No evidence of ongoing bleeding, PTA the patient was onEliquis, -restart Eliquis -Stool occult blood Negative -Hgb stable -baseline Hgb 8-9  history of second-degree AV block/history of tachyarrhythmia  -patient had a 30-day event monitor placed on 11/18/2019 -Preliminary review of his 30-day event monitor shows no significant arrhythmias -Continue Coreg,stopIV heparin  -Restart Eliquis  Generalized Weakness/deconditioning- -PT eval appreciated recommends SNF rehab  Tobacco abuse- -smoking cessation strongly advised, patient is not ready to quit   Discharge  Instructions   Allergies as of 12/21/2019   No Known Allergies     Medication List    STOP taking these medications   metoprolol tartrate 25 MG tablet Commonly known as:  LOPRESSOR     TAKE these medications   acetaminophen 325 MG tablet Commonly known as: TYLENOL Take 650 mg by mouth 3 (three) times daily.   albuterol 108 (90 Base) MCG/ACT inhaler Commonly known as: VENTOLIN HFA Inhale 2 puffs into the lungs every 4 (four) hours as needed for wheezing or shortness of breath.   apixaban 2.5 MG Tabs tablet Commonly known as: ELIQUIS Take 1 tablet (2.5 mg total) by mouth 2 (two) times daily. What changed:   medication strength  how much to take   aspirin EC 81 MG tablet Take 81 mg by mouth every morning.   carvedilol 3.125 MG tablet Commonly known as: COREG Take 1 tablet (3.125 mg total) by mouth 2 (two) times daily with a meal.   cholecalciferol 25 MCG (1000 UNIT) tablet Commonly known as: VITAMIN D3 Take 1,000 Units by mouth daily.   famotidine 20 MG tablet Commonly known as: PEPCID Take 20 mg by mouth daily.   furosemide 40 MG tablet Commonly known as: LASIX Take 1.5 tablets (60 mg total) by mouth daily. Start taking on: December 22, 2019   hydrALAZINE 25 MG tablet Commonly known as: APRESOLINE Take 1.5 tablets (37.5 mg total) by mouth 3 (three) times daily.   isosorbide mononitrate 30 MG 24 hr tablet Commonly known as: IMDUR Take 0.5 tablets (15 mg total) by mouth daily.   loratadine 10 MG tablet Commonly known as: CLARITIN Take 10 mg by mouth daily.   PARoxetine 20 MG tablet Commonly known as: PAXIL Take 20 mg by mouth daily.   rosuvastatin 10 MG tablet Commonly known as: CRESTOR Take 10 mg by mouth at bedtime.   sodium bicarbonate 650 MG tablet Take 1 tablet (650 mg total) by mouth 2 (two) times daily. What changed: when to take this   tamsulosin 0.4 MG Caps capsule Commonly known as: FLOMAX Take 0.4 mg by mouth daily.       Contact  information for follow-up providers    Arnoldo Lenis, MD Follow up on 01/01/2020.   Specialty: Cardiology Why: at 3:00PM with his Nurse Practitioner Cecilie Kicks  [facility] [place].  Contact information: [address] [address] [phone]            Contact information for after-discharge care    Destination    [facility] .   Service: Skilled Nursing Contact information: [address] [place] [phone]                 No Known Allergies  Consultations:  Cardiology     Procedures/Studies: DG Chest 2 View  Result Date: 12/15/2019 CLINICAL DATA:  Dyspnea, weakness, history of COVID-19 EXAM: CHEST - 2 VIEW COMPARISON:  12/11/2019 chest radiograph. FINDINGS: Stable metallic device in the ventral medial right chest wall. Fixation screw overlies the left glenoid. Stable cardiomediastinal silhouette with normal heart size. No pneumothorax. Small right pleural effusion is decreased. Resolved left pleural effusion. Patchy hazy and reticular opacities in both lungs appear improved with significantly improved aeration at the lung bases. IMPRESSION: 1. Significantly improved aeration at the lung bases with persistent mild patchy hazy and reticular opacities in both lungs, which could represent atypical pneumonia or nonspecific scarring. 2. Small right pleural effusion, decreased. Resolved left pleural effusion. Electronically Signed   By: Ilona Sorrel M.D.   On: 12/15/2019 10:53   DG Chest 2 View  Result Date: 12/11/2019 CLINICAL DATA:  Shortness of breath with atrial  fibrillation and hypertension EXAM: CHEST - 2 VIEW COMPARISON:  December 09, 2019 chest radiograph and chest CT September 15, 2020 FINDINGS: Underlying emphysematous change is noted, most severe in the right upper lobe. There is a right pleural effusion with airspace opacity in the right lower lobe. There is atelectatic change in the left  base. Heart is mildly enlarged. There is mild interstitial edema. Heart is slightly enlarged with pulmonary vascularity reflecting the underlying emphysema. There is aortic atherosclerosis. There is a loop recorder anteriorly. There is postoperative change in the left medial shoulder region. IMPRESSION: Findings most consistent with congestive heart failure superimposed on emphysematous change. Cardiomegaly is present with interstitial edema. Right pleural effusion. There is suspected superimposed right lower lobe pneumonia. There is aortic atherosclerosis. Aortic Atherosclerosis (ICD10-I70.0) and Emphysema (ICD10-J43.9). Electronically Signed   By: Lowella Grip III M.D.   On: 12/11/2019 11:14   DG Chest Portable 1 View  Result Date: 12/09/2019 CLINICAL DATA:  EMS reports pt is a residen at [facility]. Reports sob and cp since last night. Per Pts chart Pt has had COVID - unsure when. Hx A-fib, stroke, smoker EXAM: PORTABLE CHEST 1 VIEW COMPARISON:  09/15/2019 FINDINGS: Lungs density interstitial hazy airspace lung opacities, with more confluent opacity in the right lung base, obscuring the hemidiaphragm. Cardiac silhouette is normal in size.  Mediastinal hilar masses. Probable pleural effusions, larger on the right.  No pneumothorax. IMPRESSION: 1. Interstitial and hazy airspace lung opacities bilaterally with more confluent opacity noted in the right lung base. Findings may reflect multifocal pneumonia or pulmonary edema. Suspect right lung base opacity due to a combination of pleural fluid with either atelectasis or more confluent infection. Electronically Signed   By: Lajean Manes M.D.   On: 12/09/2019 11:34   ECHOCARDIOGRAM COMPLETE  Result Date: 12/09/2019    ECHOCARDIOGRAM REPORT   Patient Name:   GRADY MOHABIR Date of Exam: 12/09/2019 Medical Rec #:  [id number]   Height:       63.0 in Accession #:    [id number]  Weight:       125.0 lb Date of Birth:  [birth year]  BSA:          1.584 m Patient Age:     51 years    BP:           140/78 mmHg Patient Gender: M           HR:           90 bpm. Exam Location:  [facility] Procedure: 2D Echo Indications:    Dyspnea 786.09 / R06.00  History:        Patient has prior history of Echocardiogram examinations, most                 recent 09/17/2019. Stroke, Arrythmias:Atrial Fibrillation; Risk                 Factors:Current Smoker. Rhabdomyolysis, Pneumonia.  Sonographer:    Leavy Cella RDCS (AE) Referring Phys: [id number] COURAGE EMOK[place]E IMPRESSIONS  1. Left ventricular ejection fraction, by estimation, is 40%. The left ventricle has moderately decreased function. The left ventricle demonstrates global hypokinesis. Left ventricular diastolic parameters are consistent with Grade II diastolic dysfunction (pseudonormalization).  2. Right ventricular systolic function is normal. The right ventricular size is normal. There is moderately elevated pulmonary artery systolic pressure.  3. The mitral valve is grossly normal. Mild mitral valve regurgitation.  4. Morphologically, there appears to be mild aortic stenosis. The aortic valve  is tricuspid. Aortic valve regurgitation is not visualized.  5. The inferior vena cava is normal in size with greater than 50% respiratory variability, suggesting right atrial pressure of 3 mmHg. FINDINGS  Left Ventricle: Left ventricular ejection fraction, by estimation, is 40%. The left ventricle has moderately decreased function. The left ventricle demonstrates global hypokinesis. The left ventricular internal cavity size was normal in size. There is no left ventricular hypertrophy. Left ventricular diastolic parameters are consistent with Grade II diastolic dysfunction (pseudonormalization). Indeterminate filling pressures. Right Ventricle: The right ventricular size is normal. No increase in right ventricular wall thickness. Right ventricular systolic function is normal. There is moderately elevated pulmonary artery systolic pressure. The  tricuspid regurgitant velocity is 3.08 m/s, and with an assumed right atrial pressure of 10 mmHg, the estimated right ventricular systolic pressure is 79.8 mmHg. Left Atrium: Left atrial size was normal in size. Right Atrium: Right atrial size was normal in size. Pericardium: There is no evidence of pericardial effusion. Mitral Valve: The mitral valve is grossly normal. Mild mitral valve regurgitation. Tricuspid Valve: The tricuspid valve is grossly normal. Tricuspid valve regurgitation is mild. Aortic Valve: Morphologically, there appears to be mild aortic stenosis. The aortic valve is tricuspid. . There is mild thickening and mild calcification of the aortic valve. Aortic valve regurgitation is not visualized. Mild aortic valve annular calcification. There is mild thickening of the aortic valve. There is mild calcification of the aortic valve. Pulmonic Valve: The pulmonic valve was grossly normal. Pulmonic valve regurgitation is not visualized. Aorta: The aortic root is normal in size and structure. Venous: The inferior vena cava is normal in size with greater than 50% respiratory variability, suggesting right atrial pressure of 3 mmHg. IAS/Shunts: No atrial level shunt detected by color flow Doppler.  LEFT VENTRICLE PLAX 2D LVIDd:         4.76 cm  Diastology LVIDs:         4.10 cm  LV e' lateral:   6.96 cm/s LV PW:         0.84 cm  LV E/e' lateral: 9.4 LV IVS:        0.79 cm  LV e' medial:    5.98 cm/s LVOT diam:     2.10 cm  LV E/e' medial:  11.0 LVOT Area:     3.46 cm  RIGHT VENTRICLE RV S prime:     11.60 cm/s TAPSE (M-mode): 1.7 cm LEFT ATRIUM             Index       RIGHT ATRIUM           Index LA diam:        4.00 cm 2.53 cm/m  RA Area:     13.90 cm LA Vol (A2C):   74.9 ml 47.30 ml/m RA Volume:   37.30 ml  23.55 ml/m LA Vol (A4C):   42.6 ml 26.90 ml/m LA Biplane Vol: 56.1 ml 35.43 ml/m   AORTA Ao Root diam: 2.80 cm MITRAL VALVE               TRICUSPID VALVE MV Area (PHT): 2.91 cm    TR Peak grad:    37.9 mmHg MV Decel Time: 261 msec    TR Vmax:        308.00 cm/s MR Peak grad: 42.2 mmHg MR Mean grad: 29.0 mmHg    SHUNTS MR Vmax:      325.00 cm/s  Systemic Diam: 2.10 cm MR Vmean:  254.0 cm/s MV E velocity: 65.50 cm/s MV A velocity: 41.70 cm/s MV E/A ratio:  1.57 Kate Sable MD Electronically signed by Kate Sable MD Signature Date/Time: 12/09/2019/3:20:59 PM    Final    ECHOCARDIOGRAM LIMITED  Result Date: 12/11/2019    ECHOCARDIOGRAM LIMITED REPORT   Patient Name:   KIANDRE SPAGNOLO Date of Exam: 12/11/2019 Medical Rec #:  [id number]   Height:       63.0 in Accession #:    [id number]  Weight:       119.6 lb Date of Birth:  1945-10-24  BSA:          1.554 m Patient Age:    5 years    BP:           114/81 mmHg Patient Gender: M           HR:           73 bpm. Exam Location:  [facility] Procedure: Limited Echo Indications:    Dyspnea 786.09 / R06.00  History:        Patient has prior history of Echocardiogram examinations, most                 recent 12/09/2019. Previous Myocardial Infarction,                 Arrythmias:Atrial Fibrillation; Risk Factors:Current Smoker and                 Hypertension. Rhabdomyolysis.  Sonographer:    Leavy Cella RDCS (AE) Referring Phys: [id number] [place]  1. Left ventricular ejection fraction, by estimation, is 40%. The left ventricle demonstrates global hypokinesis.  2. The inferior vena cava is normal in size with greater than 50% respiratory variability, suggesting right atrial pressure of 3 mmHg.  3. Limited echo to evaluate LV function. Echocontrast was used to enhance visualization. FINDINGS  Left Ventricle: Left ventricular ejection fraction, by estimation, is 40%. The left ventricle demonstrates global hypokinesis. Definity contrast agent was given IV to delineate the left ventricular endocardial borders. Pericardium: Trivial pericardial effusion is present. The pericardial effusion is circumferential. Venous: The inferior vena cava is  normal in size with greater than 50% respiratory variability, suggesting right atrial pressure of 3 mmHg. Carlyle Dolly MD Electronically signed by Carlyle Dolly MD Signature Date/Time: 12/11/2019/3:12:49 PM    Final         Discharge Exam: Vitals:   12/20/19 2010 12/21/19 0526  BP: 115/60 118/69  Pulse: 63 65  Resp: 16 16  Temp: 98.2 F (36.8 C) 99.1 F (37.3 C)  SpO2: 99% 98%   Vitals:   12/20/19 1700 12/20/19 2010 12/21/19 0526 12/21/19 0527  BP: 118/64 115/60 118/69   Pulse:  63 65   Resp:  16 16   Temp:  98.2 F (36.8 C) 99.1 F (37.3 C)   TempSrc:  Oral Oral   SpO2:  99% 98%   Weight:    55.7 kg  Height:        General: Pt is alert, awake, not in acute distress Cardiovascular: RRR, S1/S2 +, no rubs, no gallops Respiratory: CTA bilaterally, no wheezing, no rhonchi Abdominal: Soft, NT, ND, bowel sounds + Extremities: no edema, no cyanosis   The results of significant diagnostics from this hospitalization (including imaging, microbiology, ancillary and laboratory) are listed below for reference.    Significant Diagnostic Studies: DG Chest 2 View  Result Date: 12/15/2019 CLINICAL DATA:  Dyspnea, weakness, history of COVID-19 EXAM: CHEST -  2 VIEW COMPARISON:  12/11/2019 chest radiograph. FINDINGS: Stable metallic device in the ventral medial right chest wall. Fixation screw overlies the left glenoid. Stable cardiomediastinal silhouette with normal heart size. No pneumothorax. Small right pleural effusion is decreased. Resolved left pleural effusion. Patchy hazy and reticular opacities in both lungs appear improved with significantly improved aeration at the lung bases. IMPRESSION: 1. Significantly improved aeration at the lung bases with persistent mild patchy hazy and reticular opacities in both lungs, which could represent atypical pneumonia or nonspecific scarring. 2. Small right pleural effusion, decreased. Resolved left pleural effusion. Electronically Signed    By: Ilona Sorrel M.D.   On: 12/15/2019 10:53   DG Chest 2 View  Result Date: 12/11/2019 CLINICAL DATA:  Shortness of breath with atrial fibrillation and hypertension EXAM: CHEST - 2 VIEW COMPARISON:  December 09, 2019 chest radiograph and chest CT September 15, 2020 FINDINGS: Underlying emphysematous change is noted, most severe in the right upper lobe. There is a right pleural effusion with airspace opacity in the right lower lobe. There is atelectatic change in the left base. Heart is mildly enlarged. There is mild interstitial edema. Heart is slightly enlarged with pulmonary vascularity reflecting the underlying emphysema. There is aortic atherosclerosis. There is a loop recorder anteriorly. There is postoperative change in the left medial shoulder region. IMPRESSION: Findings most consistent with congestive heart failure superimposed on emphysematous change. Cardiomegaly is present with interstitial edema. Right pleural effusion. There is suspected superimposed right lower lobe pneumonia. There is aortic atherosclerosis. Aortic Atherosclerosis (ICD10-I70.0) and Emphysema (ICD10-J43.9). Electronically Signed   By: Lowella Grip III M.D.   On: 12/11/2019 11:14   DG Chest Portable 1 View  Result Date: 12/09/2019 CLINICAL DATA:  EMS reports pt is a residen at [facility]. Reports sob and cp since last night. Per Pts chart Pt has had COVID - unsure when. Hx A-fib, stroke, smoker EXAM: PORTABLE CHEST 1 VIEW COMPARISON:  09/15/2019 FINDINGS: Lungs density interstitial hazy airspace lung opacities, with more confluent opacity in the right lung base, obscuring the hemidiaphragm. Cardiac silhouette is normal in size.  Mediastinal hilar masses. Probable pleural effusions, larger on the right.  No pneumothorax. IMPRESSION: 1. Interstitial and hazy airspace lung opacities bilaterally with more confluent opacity noted in the right lung base. Findings may reflect multifocal pneumonia or pulmonary edema. Suspect right  lung base opacity due to a combination of pleural fluid with either atelectasis or more confluent infection. Electronically Signed   By: Lajean Manes M.D.   On: 12/09/2019 11:34   ECHOCARDIOGRAM COMPLETE  Result Date: 12/09/2019    ECHOCARDIOGRAM REPORT   Patient Name:   HAMP MORELAND Date of Exam: 12/09/2019 Medical Rec #:  [id number]   Height:       63.0 in Accession #:    [id number]  Weight:       125.0 lb Date of Birth:  October 03, 1945  BSA:          1.584 m Patient Age:    65 years    BP:           140/78 mmHg Patient Gender: M           HR:           90 bpm. Exam Location:  [facility] Procedure: 2D Echo Indications:    Dyspnea 786.09 / R06.00  History:        Patient has prior history of Echocardiogram examinations, most  recent 09/17/2019. Stroke, Arrythmias:Atrial Fibrillation; Risk                 Factors:Current Smoker. Rhabdomyolysis, Pneumonia.  Sonographer:    Leavy Cella RDCS (AE) Referring Phys: [id number] COURAGE EMOKPAE IMPRESSIONS  1. Left ventricular ejection fraction, by estimation, is 40%. The left ventricle has moderately decreased function. The left ventricle demonstrates global hypokinesis. Left ventricular diastolic parameters are consistent with Grade II diastolic dysfunction (pseudonormalization).  2. Right ventricular systolic function is normal. The right ventricular size is normal. There is moderately elevated pulmonary artery systolic pressure.  3. The mitral valve is grossly normal. Mild mitral valve regurgitation.  4. Morphologically, there appears to be mild aortic stenosis. The aortic valve is tricuspid. Aortic valve regurgitation is not visualized.  5. The inferior vena cava is normal in size with greater than 50% respiratory variability, suggesting right atrial pressure of 3 mmHg. FINDINGS  Left Ventricle: Left ventricular ejection fraction, by estimation, is 40%. The left ventricle has moderately decreased function. The left ventricle demonstrates global  hypokinesis. The left ventricular internal cavity size was normal in size. There is no left ventricular hypertrophy. Left ventricular diastolic parameters are consistent with Grade II diastolic dysfunction (pseudonormalization). Indeterminate filling pressures. Right Ventricle: The right ventricular size is normal. No increase in right ventricular wall thickness. Right ventricular systolic function is normal. There is moderately elevated pulmonary artery systolic pressure. The tricuspid regurgitant velocity is 3.08 m/s, and with an assumed right atrial pressure of 10 mmHg, the estimated right ventricular systolic pressure is 89.3 mmHg. Left Atrium: Left atrial size was normal in size. Right Atrium: Right atrial size was normal in size. Pericardium: There is no evidence of pericardial effusion. Mitral Valve: The mitral valve is grossly normal. Mild mitral valve regurgitation. Tricuspid Valve: The tricuspid valve is grossly normal. Tricuspid valve regurgitation is mild. Aortic Valve: Morphologically, there appears to be mild aortic stenosis. The aortic valve is tricuspid. . There is mild thickening and mild calcification of the aortic valve. Aortic valve regurgitation is not visualized. Mild aortic valve annular calcification. There is mild thickening of the aortic valve. There is mild calcification of the aortic valve. Pulmonic Valve: The pulmonic valve was grossly normal. Pulmonic valve regurgitation is not visualized. Aorta: The aortic root is normal in size and structure. Venous: The inferior vena cava is normal in size with greater than 50% respiratory variability, suggesting right atrial pressure of 3 mmHg. IAS/Shunts: No atrial level shunt detected by color flow Doppler.  LEFT VENTRICLE PLAX 2D LVIDd:         4.76 cm  Diastology LVIDs:         4.10 cm  LV e' lateral:   6.96 cm/s LV PW:         0.84 cm  LV E/e' lateral: 9.4 LV IVS:        0.79 cm  LV e' medial:    5.98 cm/s LVOT diam:     2.10 cm  LV E/e'  medial:  11.0 LVOT Area:     3.46 cm  RIGHT VENTRICLE RV S prime:     11.60 cm/s TAPSE (M-mode): 1.7 cm LEFT ATRIUM             Index       RIGHT ATRIUM           Index LA diam:        4.00 cm 2.53 cm/m  RA Area:     13.90 cm LA Vol (A2C):  74.9 ml 47.30 ml/m RA Volume:   37.30 ml  23.55 ml/m LA Vol (A4C):   42.6 ml 26.90 ml/m LA Biplane Vol: 56.1 ml 35.43 ml/m   AORTA Ao Root diam: 2.80 cm MITRAL VALVE               TRICUSPID VALVE MV Area (PHT): 2.91 cm    TR Peak grad:   37.9 mmHg MV Decel Time: 261 msec    TR Vmax:        308.00 cm/s MR Peak grad: 42.2 mmHg MR Mean grad: 29.0 mmHg    SHUNTS MR Vmax:      325.00 cm/s  Systemic Diam: 2.10 cm MR Vmean:     254.0 cm/s MV E velocity: 65.50 cm/s MV A velocity: 41.70 cm/s MV E/A ratio:  1.57 Kate Sable MD Electronically signed by Kate Sable MD Signature Date/Time: 12/09/2019/3:20:59 PM    Final    ECHOCARDIOGRAM LIMITED  Result Date: 12/11/2019    ECHOCARDIOGRAM LIMITED REPORT   Patient Name:   ARUSH GATLIFF Date of Exam: 12/11/2019 Medical Rec #:  [id number]   Height:       63.0 in Accession #:    [id number]  Weight:       119.6 lb Date of Birth:  [birth year]  BSA:          1.554 m Patient Age:    70 years    BP:           114/81 mmHg Patient Gender: M           HR:           73 bpm. Exam Location:  [facility] Procedure: Limited Echo Indications:    Dyspnea 786.09 / R06.00  History:        Patient has prior history of Echocardiogram examinations, most                 recent 12/09/2019. Previous Myocardial Infarction,                 Arrythmias:Atrial Fibrillation; Risk Factors:Current Smoker and                 Hypertension. Rhabdomyolysis.  Sonographer:    Leavy Cella RDCS (AE) Referring Phys: [id number] Vici  1. Left ventricular ejection fraction, by estimation, is 40%. The left ventricle demonstrates global hypokinesis.  2. The inferior vena cava is normal in size with greater than 50% respiratory variability,  suggesting right atrial pressure of 3 mmHg.  3. Limited echo to evaluate LV function. Echocontrast was used to enhance visualization. FINDINGS  Left Ventricle: Left ventricular ejection fraction, by estimation, is 40%. The left ventricle demonstrates global hypokinesis. Definity contrast agent was given IV to delineate the left ventricular endocardial borders. Pericardium: Trivial pericardial effusion is present. The pericardial effusion is circumferential. Venous: The inferior vena cava is normal in size with greater than 50% respiratory variability, suggesting right atrial pressure of 3 mmHg. Carlyle Dolly MD Electronically signed by Carlyle Dolly MD Signature Date/Time: 12/11/2019/3:12:49 PM    Final      Microbiology: Recent Results (from the past 240 hour(s))  Respiratory Panel by RT PCR (Flu A&B, Covid) - Nasopharyngeal Swab     Status: Abnormal   Collection Time: 12/15/19 11:15 AM   Specimen: Nasopharyngeal Swab  Result Value Ref Range Status   SARS Coronavirus 2 by RT PCR POSITIVE (A) NEGATIVE Final    Comment: RESULT CALLED TO, READ BACK BY AND  VERIFIED WITH: BARKER B. AT 1254 ON [id number] BY THOMPSON S. (NOTE) SARS-CoV-2 target nucleic acids are DETECTED. SARS-CoV-2 RNA is generally detectable in upper respiratory specimens  during the acute phase of infection. Positive results are indicative of the presence of the identified virus, but do not rule out bacterial infection or co-infection with other pathogens not detected by the test. Clinical correlation with patient history and other diagnostic information is necessary to determine patient infection status. The expected result is Negative. Fact Sheet for Patients:  PinkCheek.be Fact Sheet for Healthcare Providers: GravelBags.it This test is not yet approved or cleared by the [place] FDA and  has been authorized for detection and/or diagnosis of SARS-CoV-2 by FDA  under an Emergency Use Authorization (EUA).  This EUA will remain in effect (meaning this test can be  used) for the duration of  the COVID-19 declaration under Section 564(b)(1) of the Act, 21 U.S.C. section 360bbb-3(b)(1), unless the authorization is terminated or revoked sooner.    Influenza A by PCR NEGATIVE NEGATIVE Final   Influenza B by PCR NEGATIVE NEGATIVE Final    Comment: (NOTE) The Xpert Xpress SARS-CoV-2/FLU/RSV assay is intended as an aid in  the diagnosis of influenza from Nasopharyngeal swab specimens and  should not be used as a sole basis for treatment. Nasal washings and  aspirates are unacceptable for Xpert Xpress SARS-CoV-2/FLU/RSV  testing. Fact Sheet for Patients: PinkCheek.be Fact Sheet for Healthcare Providers: GravelBags.it This test is not yet approved or cleared by the [place] FDA and  has been authorized for detection and/or diagnosis of SARS-CoV-2 by  FDA under an Emergency Use Authorization (EUA). This EUA will remain  in effect (meaning this test can be used) for the duration of the  Covid-19 declaration under Section 564(b)(1) of the Act, 21  U.S.C. section 360bbb-3(b)(1), unless the authorization is  terminated or revoked. Performed at [facility], [address] [place], [place] [address]      Labs: Basic Metabolic Panel: Recent Labs  Lab 12/16/19 [phone] 12/16/19 0517 12/17/19 0547 12/17/19 0547 12/18/19 0627 12/18/19 0627 12/19/19 0644 12/20/19 0629  NA 130*  --  130*  --  130*  --  132* 130*  K 4.6   < > 3.8   < > 4.1   < > 4.4 4.1  CL 103  --  103  --  101  --  101 100  CO2 18*  --  21*  --  23  --  24 23  GLUCOSE 117*  --  89  --  86  --  90 87  BUN 35*  --  41*  --  43*  --  42* 40*  CREATININE 2.17*  --  2.40*  --  2.14*  --  2.18* 2.01*  CALCIUM 9.4  --  9.5  --  9.6  --  10.0 9.7  MG 2.1  --  2.0  --  2.0  --   --   --   PHOS 3.7  --  3.0  --  2.8  --   --   --     < > = values in this interval not displayed.   Liver Function Tests: Recent Labs  Lab 12/16/19 0517 12/17/19 0547 12/18/19 0627  AST 18 23 18   ALT 16 17 16   ALKPHOS 64 58 58  BILITOT 0.5 0.3 0.4  PROT 5.9* 5.4* 5.5*  ALBUMIN 2.6* 2.7* 2.7*   No results for input(s): LIPASE, AMYLASE in the last  168 hours. No results for input(s): AMMONIA in the last 168 hours. CBC: Recent Labs  Lab 12/15/19 0437 12/16/19 0517 12/17/19 0547 12/18/19 0627  WBC 6.7 4.3 8.3 9.6  NEUTROABS  --  3.2 4.3 5.2  HGB 8.8* 9.1* 8.5* 8.9*  HCT 27.3* 28.9* 26.5* 27.3*  MCV 98.9 97.3 98.1 96.8  PLT 376 394 358 353   Cardiac Enzymes: No results for input(s): CKTOTAL, CKMB, CKMBINDEX, TROPONINI in the last 168 hours. BNP: Invalid input(s): POCBNP CBG: Recent Labs  Lab 12/18/19 1123 12/18/19 1716 12/18/19 2124 12/19/19 0817 12/19/19 1122  GLUCAP 81 87 81 89 92    Time coordinating discharge:  36 minutes  Signed:  Orson Eva, DO [facility] Pager: [phone] 12/21/2019, 9:30 AM

## 2019-12-21 LAB — FERRITIN: Ferritin: 46 ng/mL (ref 24–336)

## 2019-12-21 LAB — COMPREHENSIVE METABOLIC PANEL
ALT: 12 U/L (ref 0–44)
AST: 19 U/L (ref 15–41)
Albumin: 2.9 g/dL — ABNORMAL LOW (ref 3.5–5.0)
Alkaline Phosphatase: 58 U/L (ref 38–126)
Anion gap: 7 (ref 5–15)
BUN: 38 mg/dL — ABNORMAL HIGH (ref 8–23)
CO2: 25 mmol/L (ref 22–32)
Calcium: 9.7 mg/dL (ref 8.9–10.3)
Chloride: 99 mmol/L (ref 98–111)
Creatinine, Ser: 2.31 mg/dL — ABNORMAL HIGH (ref 0.61–1.24)
GFR calc Af Amer: 32 mL/min — ABNORMAL LOW (ref >60)
GFR calc non Af Amer: 27 mL/min — ABNORMAL LOW (ref >60)
Glucose, Bld: 91 mg/dL (ref 70–99)
Potassium: 4.4 mmol/L (ref 3.5–5.1)
Sodium: 131 mmol/L — ABNORMAL LOW (ref 135–145)
Total Bilirubin: 0.5 mg/dL (ref 0.3–1.2)
Total Protein: 5.7 g/dL — ABNORMAL LOW (ref 6.5–8.1)

## 2019-12-21 LAB — CBC
HCT: 28.2 % — ABNORMAL LOW (ref 39.0–52.0)
Hemoglobin: 9.1 g/dL — ABNORMAL LOW (ref 13.0–17.0)
MCH: 31.3 pg (ref 26.0–34.0)
MCHC: 32.3 g/dL (ref 30.0–36.0)
MCV: 96.9 fL (ref 80.0–100.0)
Platelets: 394 10*3/uL (ref 150–400)
RBC: 2.91 MIL/uL — ABNORMAL LOW (ref 4.22–5.81)
RDW: 14.2 % (ref 11.5–15.5)
WBC: 7.2 10*3/uL (ref 4.0–10.5)
nRBC: 0 % (ref 0.0–0.2)

## 2019-12-21 LAB — D-DIMER, QUANTITATIVE: D-Dimer, Quant: 4.67 ug/mL-FEU — ABNORMAL HIGH (ref 0.00–0.50)

## 2019-12-21 LAB — GLUCOSE, CAPILLARY: Glucose-Capillary: 87 mg/dL (ref 70–99)

## 2019-12-21 LAB — C-REACTIVE PROTEIN: CRP: 0.7 mg/dL (ref <1.0)

## 2019-12-21 LAB — MAGNESIUM: Magnesium: 2.2 mg/dL (ref 1.7–2.4)

## 2019-12-21 MED ORDER — FUROSEMIDE 40 MG PO TABS: 60.0000 mg | ORAL_TABLET | Freq: Every day | ORAL | Status: DC

## 2019-12-21 MED ORDER — FUROSEMIDE 40 MG PO TABS: 40.0000 mg | ORAL_TABLET | Freq: Every day | ORAL | Status: DC

## 2019-12-21 NOTE — TOC Transition Note (Signed)
Transition of Care [facility]) - CM/SW Discharge Note   Patient Details  Name: Anthony Andrews MRN: [id number] Date of Birth: March 26, 1946  Transition of Care [facility]) CM/SW Contact:  Boneta Lucks, RN Phone Number: 12/21/2019, 10:16 AM   Clinical Narrative:  Received  PASSR # [id number] H. Updated Olivia Mackie at [facility], they are ready for patient. Clinicals sent in the hub. TOC called Iona Beard -son to updated him with DC plan.  RN to call report and TOC will assist with EMS.   Final next level of care: Skilled Nursing Facility Barriers to Discharge: Barriers Resolved   Patient Goals and CMS Choice Patient states their goals for this hospitalization and ongoing recovery are:: to returne to SNF CMS Medicare.gov Compare Post Acute Care list provided to:: Patient Represenative (must comment) Choice offered to / list presented to : Adult Children  Discharge Placement PASRR number recieved: 12/21/19            Patient chooses bed at: [facility] Patient to be transferred to facility by: EMS Name of family member notified: Iona Beard Patient and family notified of of transfer: 12/21/19  Discharge Plan and Services     Post Acute Care Choice: [place]              Readmission Risk Interventions Readmission Risk Prevention Plan 12/14/2019  Transportation Screening Complete  PCP or Specialist Appt within 3-5 Days Not Complete  HRI or Home Care Consult Not Complete  Social Work Consult for [place] Planning/Counseling Complete  Palliative Care Screening Not Complete  Medication Review Press photographer) Complete

## 2019-12-24 NOTE — Telephone Encounter (Signed)
[facility] has not received monitor back from patient. They have attempted to contact the patient on 3 occasions to request monitor be returned to [facility].  CANCELLED ORDER 

## 2019-12-31 NOTE — Progress Notes (Signed)
   Cardiology Office Note   Date:  12/31/2019   ID:  Anthony Andrews, DOB 20-Jan-1946, MRN [id number]  PCP:  [name], MD  Cardiologist:  Dr. Harl Bowie    Pt came from Nursing home and began vomiting.  He has vomited X 3 at least. [employer] and undigested food.  Pulse was regular.  We sent to ER for evaluation.  He said he had not had BM.  He denied chest pain.     I did not see pt for exam due to acute condition , no charge  Signed, Cecilie Kicks, NP  12/31/2019 10:06 PM    [facility], [place], [place] Phone: [phone]; Fax: [phone]

## 2020-01-01 ENCOUNTER — Encounter: Payer: Self-pay | Admitting: Cardiology

## 2020-01-01 ENCOUNTER — Emergency Department (HOSPITAL_COMMUNITY)
Admission: EM | Admit: 2020-01-01 | Discharge: 2020-01-01 | Disposition: A | Discharge status: Skilled Nursing Facility | Payer: Medicare PPO | Attending: Emergency Medicine | Admitting: Emergency Medicine

## 2020-01-01 ENCOUNTER — Ambulatory Visit (INDEPENDENT_AMBULATORY_CARE_PROVIDER_SITE_OTHER): Payer: Medicare PPO | Admitting: Cardiology

## 2020-01-01 ENCOUNTER — Emergency Department (HOSPITAL_COMMUNITY): Payer: Medicare PPO

## 2020-01-01 ENCOUNTER — Encounter (HOSPITAL_COMMUNITY): Payer: Self-pay | Admitting: *Deleted

## 2020-01-01 ENCOUNTER — Other Ambulatory Visit: Payer: Self-pay

## 2020-01-01 VITALS — BP 102/60 | HR 82 | Temp 97.9°F | Ht 63.0 in

## 2020-01-01 DIAGNOSIS — Z7901 Long term (current) use of anticoagulants: Secondary | ICD-10-CM | POA: Insufficient documentation

## 2020-01-01 DIAGNOSIS — I130 Hypertensive heart and chronic kidney disease with heart failure and stage 1 through stage 4 chronic kidney disease, or unspecified chronic kidney disease: Secondary | ICD-10-CM | POA: Diagnosis not present

## 2020-01-01 DIAGNOSIS — R112 Nausea with vomiting, unspecified: Secondary | ICD-10-CM

## 2020-01-01 DIAGNOSIS — F17210 Nicotine dependence, cigarettes, uncomplicated: Secondary | ICD-10-CM | POA: Diagnosis not present

## 2020-01-01 DIAGNOSIS — I5043 Acute on chronic combined systolic (congestive) and diastolic (congestive) heart failure: Secondary | ICD-10-CM | POA: Insufficient documentation

## 2020-01-01 DIAGNOSIS — I4891 Unspecified atrial fibrillation: Secondary | ICD-10-CM | POA: Diagnosis not present

## 2020-01-01 DIAGNOSIS — Z79899 Other long term (current) drug therapy: Secondary | ICD-10-CM | POA: Diagnosis not present

## 2020-01-01 DIAGNOSIS — N390 Urinary tract infection, site not specified: Secondary | ICD-10-CM | POA: Insufficient documentation

## 2020-01-01 DIAGNOSIS — Z8616 Personal history of COVID-19: Secondary | ICD-10-CM | POA: Diagnosis not present

## 2020-01-01 DIAGNOSIS — N183 Chronic kidney disease, stage 3 unspecified: Secondary | ICD-10-CM | POA: Diagnosis not present

## 2020-01-01 LAB — COMPREHENSIVE METABOLIC PANEL
ALT: 20 U/L (ref 0–44)
AST: 21 U/L (ref 15–41)
Albumin: 2.9 g/dL — ABNORMAL LOW (ref 3.5–5.0)
Alkaline Phosphatase: 75 U/L (ref 38–126)
Anion gap: 8 (ref 5–15)
BUN: 44 mg/dL — ABNORMAL HIGH (ref 8–23)
CO2: 24 mmol/L (ref 22–32)
Calcium: 9.6 mg/dL (ref 8.9–10.3)
Chloride: 98 mmol/L (ref 98–111)
Creatinine, Ser: 2.58 mg/dL — ABNORMAL HIGH (ref 0.61–1.24)
GFR calc Af Amer: 28 mL/min — ABNORMAL LOW (ref >60)
GFR calc non Af Amer: 24 mL/min — ABNORMAL LOW (ref >60)
Glucose, Bld: 108 mg/dL — ABNORMAL HIGH (ref 70–99)
Potassium: 4.3 mmol/L (ref 3.5–5.1)
Sodium: 130 mmol/L — ABNORMAL LOW (ref 135–145)
Total Bilirubin: 0.3 mg/dL (ref 0.3–1.2)
Total Protein: 6.6 g/dL (ref 6.5–8.1)

## 2020-01-01 LAB — URINALYSIS, ROUTINE W REFLEX MICROSCOPIC
Bilirubin Urine: NEGATIVE
Glucose, UA: NEGATIVE mg/dL
Ketones, ur: NEGATIVE mg/dL
Nitrite: NEGATIVE
Protein, ur: 100 mg/dL — AB
RBC / HPF: >50 RBC/hpf — ABNORMAL HIGH (ref 0–5)
Specific Gravity, Urine: 1.014 (ref 1.005–1.030)
WBC, UA: >50 WBC/hpf — ABNORMAL HIGH (ref 0–5)
pH: 5 (ref 5.0–8.0)

## 2020-01-01 LAB — CBC
HCT: 27.9 % — ABNORMAL LOW (ref 39.0–52.0)
Hemoglobin: 8.7 g/dL — ABNORMAL LOW (ref 13.0–17.0)
MCH: 30.7 pg (ref 26.0–34.0)
MCHC: 31.2 g/dL (ref 30.0–36.0)
MCV: 98.6 fL (ref 80.0–100.0)
Platelets: 398 10*3/uL (ref 150–400)
RBC: 2.83 MIL/uL — ABNORMAL LOW (ref 4.22–5.81)
RDW: 14 % (ref 11.5–15.5)
WBC: 10.7 10*3/uL — ABNORMAL HIGH (ref 4.0–10.5)
nRBC: 0 % (ref 0.0–0.2)

## 2020-01-01 LAB — LIPASE, BLOOD: Lipase: 28 U/L (ref 11–51)

## 2020-01-01 MED ORDER — ONDANSETRON 4 MG PO TBDP: ORAL_TABLET | ORAL | 0 refills | Status: DC

## 2020-01-01 MED ORDER — SODIUM CHLORIDE 0.9 % IV BOLUS
500.0000 mL | Freq: Once | INTRAVENOUS | Status: AC
  Administered 2020-01-01: 18:00:00 500 mL via INTRAVENOUS

## 2020-01-01 MED ORDER — CEPHALEXIN 500 MG PO CAPS: 500.0000 mg | ORAL_CAPSULE | Freq: Four times a day (QID) | ORAL | 0 refills | Status: DC

## 2020-01-01 MED ORDER — SODIUM CHLORIDE 0.9 % IV SOLN
1.0000 g | Freq: Once | INTRAVENOUS | Status: AC
  Administered 2020-01-01: 1 g via INTRAVENOUS
  Filled 2020-01-01: qty 10

## 2020-01-01 MED ORDER — ONDANSETRON HCL 4 MG/2ML IJ SOLN
4.0000 mg | Freq: Once | INTRAMUSCULAR | Status: AC
  Administered 2020-01-01: 19:00:00 4 mg via INTRAVENOUS
  Filled 2020-01-01: qty 2

## 2020-01-01 MED ORDER — SODIUM CHLORIDE 0.9 % IV BOLUS: 500.0000 mL | Freq: Once | INTRAVENOUS | Status: DC

## 2020-01-01 NOTE — ED Triage Notes (Signed)
Patient was in cardiology and started to vomit. No vomiting today.

## 2020-01-01 NOTE — Progress Notes (Signed)
RN [facility] called pt son to inform him of patient being discharged and to update on patients condition. Pt son reports that patient is from [facility] located at [facility]. RN [facility] contacted Tasha at [facility] and was informed that their transportation only runs from 8-5 but the patient can return to the facility via EMS. RN [facility] notified patients nurse of correct facility that patient resides in so transport can be arranged.

## 2020-01-01 NOTE — Discharge Instructions (Addendum)
Patient has a UTI but lab work today is otherwise been reassuring.  Abdominal x-ray does show some constipation, recommend MiraLAX.  Take Keflex 4 times daily for UTI.  Patient was given IV dose of Rocephin in the ED.  Encourage p.o. fluids.  Zofran as needed for vomiting.  If patient develops abdominal or flank pain, persistent vomiting, fevers or any other new or concerning symptoms return to emergency department.

## 2020-01-01 NOTE — ED Notes (Signed)
Call from [facility]   They refuse to take pt back until he has 2 negative covid test   Note: pt sent here without same

## 2020-01-01 NOTE — ED Notes (Signed)
Caller who identifies as Jacquenette Shone "DON" of [facility] called   Reported that pt was a pt of pelican   "caught covid at [employer] to [facility] in [place] due to his Covid dx  When asked why a covid pos pt would be sent to cardiac rehab, she reporls pt is covid positive  And not a pt of pelican   Note from Cardiac rehab states pt is from nursing home  Call to [place], [place]   And discussed with

## 2020-01-01 NOTE — ED Notes (Signed)
Pt has not vomited since arrival to care area   He has been quiet and cooperative   Sipping water without a problem currently

## 2020-01-01 NOTE — ED Provider Notes (Signed)
[facility] EMERGENCY DEPARTMENT Provider Note   CSN: [id number] Arrival date & time: 01/01/20  1546     History Chief Complaint  Patient presents with  . Nausea  . Emesis    Anthony Andrews is a 74 y.o. male.  Anthony Andrews is a 74 y.o. male with a history of hypertension, A. fib, CKD, stroke, rhabdo, who presents to the emergency department from the cardiology office for evaluation of vomiting.  Patient is a resident at [facility] extended-care facility.  Patient states while he was at the cardiology office he had 3 episodes of emesis, reports prior to this he was feeling fine.  He denies any abdominal pain, no diarrhea or constipation, states his last bowel movement was yesterday which was normal, he has not noticed any blood in his stools.  He denies fevers or chills.  No associated chest pain or shortness of breath.  He was just at the cardiology office for routine checkup.  He denies urinary symptoms.  No other aggravating or alleviating factors.        Past Medical History:  Diagnosis Date  . Acute metabolic encephalopathy   . AKI (acute kidney injury) ([place])   . Atrial fibrillation ([place])   . Chronic hyponatremia   . COVID-19   . Dysphagia   . Essential hypertension   . Hyponatremia   . Kidney failure   . Respiratory failure ([place])   . Rhabdomyolysis   . Stroke ([place])   . Tobacco use     Patient Active Problem List   Diagnosis Date Noted  . Acute respiratory disease due to COVID-19 virus 12/15/2019  . Acute on chronic combined systolic and diastolic CHF (congestive heart failure) ([place])   . PAF (paroxysmal atrial fibrillation) ([place])   . Essential hypertension   . Bilateral carotid artery stenosis   . Anemia   . Stage 3 chronic kidney disease   . Pneumonia 12/09/2019  . Abnormal result of cardiovascular function study suggestive of non-ST elevation myocardial infarction (NSTEMI) 12/09/2019  . Acute on chronic HFrEF (heart failure with reduced ejection fraction) ([place])  12/09/2019  . Non-ST elevation (NSTEMI) myocardial infarction ([place]) 12/09/2019  . Tobacco abuse 12/09/2019  . Acute respiratory failure with hypoxia ([place]) 09/15/2019  . Atrial fibrillation ([place]) 09/15/2019  . AKI (acute kidney injury) on CKD IIIa 09/12/2019  . Acute metabolic encephalopathy 74/25/9563  . Chronic hyponatremia 09/12/2019  . Metabolic acidosis 87/56/4332  . Rhabdomyolysis 09/11/2019    Past Surgical History:  Procedure Laterality Date  . BACK SURGERY    . SHOULDER SURGERY         Family History  Problem Relation Age of Onset  . Heart attack Mother   . Heart attack Father     Social History   Tobacco Use  . Smoking status: Current Every Day Smoker    Types: Cigarettes  . Smokeless tobacco: Former Systems developer  . Tobacco comment: 3 cigs/per day  Substance Use Topics  . Alcohol use: Not Currently  . Drug use: Never    Home Medications Prior to Admission medications   Medication Sig Start Date End Date Taking? Authorizing Provider  acetaminophen (TYLENOL) 325 MG tablet Take 650 mg by mouth 3 (three) times daily.    [provider]  albuterol (VENTOLIN HFA) 108 (90 Base) MCG/ACT inhaler Inhale 2 puffs into the lungs every 4 (four) hours as needed for wheezing or shortness of breath.  05/27/19   [provider]  apixaban (ELIQUIS) 2.5 MG TABS tablet Take  1 tablet (2.5 mg total) by mouth 2 (two) times daily. 12/17/19   Orson Eva, MD  aspirin EC 81 MG tablet Take 81 mg by mouth every morning.    [provider]  carvedilol (COREG) 3.125 MG tablet Take 1 tablet (3.125 mg total) by mouth 2 (two) times daily with a meal. 12/17/19   Tat, Shanon Brow, MD  cephALEXin (KEFLEX) 500 MG capsule Take 1 capsule (500 mg total) by mouth 4 (four) times daily. 01/01/20   Jacqlyn Larsen, PA-C  cholecalciferol (VITAMIN D3) 25 MCG (1000 UT) tablet Take 1,000 Units by mouth daily.    [provider]  famotidine (PEPCID) 20 MG tablet Take 20 mg by mouth daily.     [provider]  furosemide (LASIX) 40 MG tablet Take 1.5 tablets (60 mg total) by mouth daily. 12/22/19   Orson Eva, MD  hydrALAZINE (APRESOLINE) 25 MG tablet Take 1.5 tablets (37.5 mg total) by mouth 3 (three) times daily. 12/17/19   Orson Eva, MD  isosorbide mononitrate (IMDUR) 30 MG 24 hr tablet Take 0.5 tablets (15 mg total) by mouth daily. 12/18/19   Orson Eva, MD  loratadine (CLARITIN) 10 MG tablet Take 10 mg by mouth daily.    [provider]  ondansetron (ZOFRAN ODT) 4 MG disintegrating tablet 4mg  ODT q4 hours prn nausea/vomit 01/01/20   Jacqlyn Larsen, PA-C  PARoxetine (PAXIL) 20 MG tablet Take 20 mg by mouth daily.     [provider]  rosuvastatin (CRESTOR) 10 MG tablet Take 10 mg by mouth at bedtime.    [provider]  sodium bicarbonate 650 MG tablet Take 1 tablet (650 mg total) by mouth 2 (two) times daily. 12/17/19   Orson Eva, MD  tamsulosin (FLOMAX) 0.4 MG CAPS capsule Take 0.4 mg by mouth daily.    [provider]    Allergies    Patient has no known allergies.  Review of Systems   Review of Systems  Constitutional: Negative for chills and fever.  HENT: Negative.   Respiratory: Negative for cough and shortness of breath.   Cardiovascular: Negative for chest pain.  Gastrointestinal: Positive for nausea and vomiting. Negative for abdominal pain, constipation and diarrhea.  Genitourinary: Negative for dysuria and frequency.  Musculoskeletal: Negative for arthralgias and myalgias.  Skin: Negative for color change and rash.  Neurological: Negative for dizziness, syncope and light-headedness.  All other systems reviewed and are negative.   Physical Exam Updated Vital Signs BP (!) 93/52   Pulse 69   Temp 97.7 F (36.5 C)   Resp 18   SpO2 99%   Physical Exam Vitals and nursing note reviewed.  Constitutional:      General: He is not in acute distress.    Appearance: He is well-developed. He is not diaphoretic.      Comments: Frail elderly gentleman, alert and in no acute distress  HENT:     Head: Normocephalic and atraumatic.     Mouth/Throat:     Mouth: Mucous membranes are moist.     Pharynx: Oropharynx is clear.  Eyes:     General:        Right eye: No discharge.        Left eye: No discharge.  Cardiovascular:     Rate and Rhythm: Normal rate and regular rhythm.     Pulses: Normal pulses.     Heart sounds: Normal heart sounds. No murmur. No friction rub. No gallop.   Pulmonary:  Effort: Pulmonary effort is normal. No respiratory distress.     Breath sounds: Normal breath sounds. No wheezing or rales.     Comments: Respirations equal and unlabored, patient able to speak in full sentences, lungs clear to auscultation bilaterally Abdominal:     General: Bowel sounds are normal. There is no distension.     Palpations: Abdomen is soft. There is no mass.     Tenderness: There is no abdominal tenderness. There is no guarding.     Comments: Abdomen soft, nondistended, nontender to palpation in all quadrants without guarding or peritoneal signs  Musculoskeletal:        General: No deformity.     Cervical back: Neck supple.     Right lower leg: No edema.     Left lower leg: No edema.  Skin:    General: Skin is warm and dry.     Capillary Refill: Capillary refill takes less than 2 seconds.  Neurological:     Mental Status: He is alert.     Coordination: Coordination normal.     Comments: Speech is clear, able to follow commands Moves extremities without ataxia, coordination intact  Psychiatric:        Mood and Affect: Mood normal.        Behavior: Behavior normal.     ED Results / Procedures / Treatments   Labs (all labs ordered are listed, but only abnormal results are displayed) Labs Reviewed  CBC - Abnormal; Notable for the following components:      Result Value   WBC 10.7 (*)    RBC 2.83 (*)    Hemoglobin 8.7 (*)    HCT 27.9 (*)    All other components within normal limits   COMPREHENSIVE METABOLIC PANEL - Abnormal; Notable for the following components:   Sodium 130 (*)    Glucose, Bld 108 (*)    BUN 44 (*)    Creatinine, Ser 2.58 (*)    Albumin 2.9 (*)    GFR calc non Af Amer 24 (*)    GFR calc Af Amer 28 (*)    All other components within normal limits  URINALYSIS, ROUTINE W REFLEX MICROSCOPIC - Abnormal; Notable for the following components:   APPearance HAZY (*)    Hgb urine dipstick MODERATE (*)    Protein, ur 100 (*)    Leukocytes,Ua LARGE (*)    RBC / HPF >50 (*)    WBC, UA >50 (*)    Bacteria, UA MANY (*)    All other components within normal limits  URINE CULTURE  LIPASE, BLOOD    EKG EKG Interpretation  Date/Time:  Friday January 01 2020 17:23:53 EDT Ventricular Rate:  73 PR Interval:  162 QRS Duration: 84 QT Interval:  416 QTC Calculation: 458 R Axis:   76 Text Interpretation: Normal sinus rhythm Normal ECG No significant change since last tracing Confirmed by Lajean Saver [phone]) on 01/01/2020 5:39:42 PM   Radiology DG Abd Acute W/Chest  Result Date: 01/01/2020 CLINICAL DATA:  74 year old male with vomiting. EXAM: DG ABDOMEN ACUTE W/ 1V CHEST COMPARISON:  Chest radiograph dated 12/15/2019. FINDINGS: There is background of emphysema and diffuse interstitial coarsening. No focal consolidation, pleural effusion, or pneumothorax. The cardiac silhouette is within normal limits. Atherosclerotic calcification of the aorta. Metallic device over the anterior chest wall similar to prior radiograph. Fixation screw of the left scapula. There is moderate stool throughout the colon. No bowel dilatation or evidence of obstruction. No free air. Several small  radiopaque foci over the left renal silhouette may represent vascular calcification or small renal calculi. Lower lumbar fusion hardware. No acute osseous pathology. IMPRESSION: 1. No acute cardiopulmonary process. 2. Emphysema. 3. Constipation. No evidence of bowel obstruction. Electronically Signed    By: Anner Crete M.D.   On: 01/01/2020 19:25    Procedures Procedures (including critical care time)  Medications Ordered in ED Medications  sodium chloride 0.9 % bolus 500 mL (0 mLs Intravenous Stopped 01/01/20 2014)  ondansetron (ZOFRAN) injection 4 mg (4 mg Intravenous Given 01/01/20 1832)  cefTRIAXone (ROCEPHIN) 1 g in sodium chloride 0.9 % 100 mL IVPB (0 g Intravenous Stopped 01/01/20 2112)    ED Course  I have reviewed the triage vital signs and the nursing notes.  Pertinent labs & imaging results that were available during my care of the patient were reviewed by me and considered in my medical decision making (see chart for details).    MDM Rules/Calculators/A&P                     74 year old male presents from cardiology office for evaluation of nausea and vomiting.  Symptoms started abruptly while he was at the cardiology office, he reports prior to this he was feeling well.  On arrival he is afebrile with normal vitals, at one point he did have a borderline soft blood pressure of 93/52, but with 500 cc fluid bolus this quickly improved with no further episodes of hypotension and no associated tachycardia or fevers.  On exam patient did not have any abdominal tenderness, denies chest pain or shortness of breath.  Will check labs, EKG and acute abdominal series.  Zofran given in addition to fluids.  Unclear etiology for patient's vomiting, he is not distended or tender on exam but will get plain films to rule out obstruction.  EKG shows normal sinus rhythm with no concerning changes.  Lab work and imaging was ordered, reviewed and interpreted by myself.  Slight leukocytosis of 10.7, hemoglobin is at baseline, urinalysis shows mild hyponatremia of 130 which appears to be chronic, creatinine slightly increased from baseline, 2.58 today, usually is around 2.0-2.3.  No other significant electrolyte derangements, normal liver function and lipase.  Urinalysis consistent with infection with  large leukocytes, greater than 50 RBCs and WBCs and many bacteria present.  Despite urinalysis patient does not have any flank or abdominal tenderness.  Abdominal x-ray shows some signs of constipation but no obstructive bowel gas pattern and no acute cardiopulmonary disease which is reassuring.  While monitored here in the emergency department patient had no further episodes of emesis and is now tolerating p.o. fluids.  He was given 1 g of IV Rocephin, urine culture pending.  Will discharge back to care facility on Keflex, Zofran given for nausea and vomiting.  Strict return precautions provided.  Discharged home in good condition.  Patient discussed with Dr. Ashok Cordia, who saw patient as well and agrees with plan.   Final Clinical Impression(s) / ED Diagnoses Final diagnoses:  Acute UTI  Non-intractable vomiting with nausea, unspecified vomiting type    Rx / DC Orders ED Discharge Orders         Ordered    cephALEXin (KEFLEX) 500 MG capsule  4 times daily     01/01/20 2113    ondansetron (ZOFRAN ODT) 4 MG disintegrating tablet     01/01/20 2113           Jacqlyn Larsen, [place] 01/01/20 2141  Lajean Saver, MD 01/01/20 2352

## 2020-01-01 NOTE — ED Notes (Signed)
Call to [facility] for pt transport   Anthony Andrews  She states she doe not know if pt is a pelican pt will call if not   Reports recieved

## 2020-01-01 NOTE — ED Notes (Addendum)
Pt is being transported to [facility] via [place].  Pt has his personal wheelchair while here in the e.d.   This nurse called the pt's Son Iona Beard who did not answer the phone, but a message was left on his voicemail regarding the need for someone to come to [facility] to pick up the wheelchair.   Wheelchair is in the e.d. ems storage room near the ems entrance.

## 2020-01-04 LAB — URINE CULTURE: Culture: >=100000 — AB

## 2020-01-11 ENCOUNTER — Ambulatory Visit: Payer: Medicare PPO | Admitting: Neurology

## 2020-01-13 ENCOUNTER — Ambulatory Visit: Payer: Medicare PPO | Admitting: Cardiovascular Disease

## 2020-01-20 ENCOUNTER — Telehealth (INDEPENDENT_AMBULATORY_CARE_PROVIDER_SITE_OTHER): Payer: Medicare PPO | Admitting: Student

## 2020-01-20 ENCOUNTER — Encounter: Payer: Self-pay | Admitting: Student

## 2020-01-20 VITALS — BP 113/80 | HR 73 | Temp 97.2°F | Wt 109.0 lb

## 2020-01-20 DIAGNOSIS — I214 Non-ST elevation (NSTEMI) myocardial infarction: Secondary | ICD-10-CM

## 2020-01-20 DIAGNOSIS — E785 Hyperlipidemia, unspecified: Secondary | ICD-10-CM

## 2020-01-20 DIAGNOSIS — I5042 Chronic combined systolic (congestive) and diastolic (congestive) heart failure: Secondary | ICD-10-CM

## 2020-01-20 DIAGNOSIS — I480 Paroxysmal atrial fibrillation: Secondary | ICD-10-CM

## 2020-01-20 DIAGNOSIS — I10 Essential (primary) hypertension: Secondary | ICD-10-CM | POA: Diagnosis not present

## 2020-01-20 DIAGNOSIS — D649 Anemia, unspecified: Secondary | ICD-10-CM

## 2020-01-20 DIAGNOSIS — N184 Chronic kidney disease, stage 4 (severe): Secondary | ICD-10-CM

## 2020-01-20 NOTE — Progress Notes (Signed)
Virtual Visit via Telephone Note   This visit type was conducted due to national recommendations for restrictions regarding the COVID-19 Pandemic (e.g. social distancing) in an effort to limit this patient's exposure and mitigate transmission in our community.  Due to his co-morbid illnesses, this patient is at least at moderate risk for complications without adequate follow up.  This format is felt to be most appropriate for this patient at this time.  The patient did not have access to video technology/had technical difficulties with video requiring transitioning to audio format only (telephone).  All issues noted in this document were discussed and addressed.  No physical exam could be performed with this format.  Please refer to the patient's chart for his  consent to telehealth for [facility].   The patient was identified using 2 identifiers.  Date:  01/20/2020   ID:  Wyvonnia Lora, DOB January 13, 1946, MRN [id number]  Patient Location: [place] Provider Location: Office  PCP:  [name], MD  Cardiologist:  Quay Burow, MD  Electrophysiologist:  None   Evaluation Performed:  Follow-Up Visit  Chief Complaint: Hospital Follow-up  History of Present Illness:    Julis Haubner is a 74 y.o. male with past medical history of HTN, HLD, carotid artery stenosis, paroxysmal atrial fibrillation (diagnosed in 08/2019 with episodes of secondary AV block during admission), cardiomyopathy (EF 45-50% by echo in 08/2019), chronic anemia and prior CVA who presents to the office today for 2-week telehealth follow-up.   He was recently admitted to [facility] on 12/09/2019 for evaluation of dyspnea on exertion and was found to have an NSTEMI with peak HS Troponin of 4848. He had a AKI during admission and catheterization was not pursued given the risk of contrast induced nephropathy. A repeat echo showed EF was at 40% and he responded well to IV Lasix with weight having declined from 132  lbs on admission to 119 lbs at discharge. He was started on Coreg 3.125mg  BID, Lasix 60mg  daily, Imdur 15mg  daily and Hydralazine 37.5mg  TID. He was retested for COVID-19 later in admission and tested positive, therefore further inpatient testing was not pursued.   He had a hospital follow-up visit with Cecilie Kicks, NP on 01/01/2020 and was actively vomiting, therefore his visit was cancelled and he was sent to the Emergency Department for further evaluation. Labs did show he was anemic with Hgb at 8.7 which was similar to prior values and creatinine was elevated to 2.58. Abdominal imaging showed constipation but no obstructive bowel patterns. UA was consistent with UTI, therefore he was given Rocephin and discharged on Keflex.   In talking with the patient today, he reports overall doing well since returning to SNF. He is mostly wheelchair-bound but does utilize his walker at times. He reports his breathing has been at baseline and he denies any recent orthopnea, PND or lower extremity edema. No recent chest pain or palpitations. His weight has declined by over 10 pounds since hospital discharge and by review of his medication list, Hydralazine and Lasix were discontinued by the providers at his medical facility. His nurse was unable to locate a reason for this on the paperwork he had available.   Past Medical History:  Diagnosis Date  . Acute metabolic encephalopathy   . AKI (acute kidney injury) ([place])   . Atrial fibrillation ([place])   . Chronic hyponatremia   . COVID-19   . Dysphagia   . Essential hypertension   . Hyponatremia   . Kidney failure   .  Respiratory failure ([place])   . Rhabdomyolysis   . Stroke ([place])   . Tobacco use    Past Surgical History:  Procedure Laterality Date  . BACK SURGERY    . SHOULDER SURGERY       Current Meds  Medication Sig  . acetaminophen (TYLENOL) 325 MG tablet Take 650 mg by mouth 3 (three) times daily.  Marland Kitchen albuterol (VENTOLIN HFA) 108 (90 Base) MCG/ACT  inhaler Inhale 2 puffs into the lungs every 4 (four) hours as needed for wheezing or shortness of breath.   Marland Kitchen apixaban (ELIQUIS) 2.5 MG TABS tablet Take 1 tablet (2.5 mg total) by mouth 2 (two) times daily.  Marland Kitchen aspirin EC 81 MG tablet Take 81 mg by mouth every morning.  . carvedilol (COREG) 3.125 MG tablet Take 1 tablet (3.125 mg total) by mouth 2 (two) times daily with a meal.  . cholecalciferol (VITAMIN D3) 25 MCG (1000 UT) tablet Take 1,000 Units by mouth daily.  . famotidine (PEPCID) 20 MG tablet Take 20 mg by mouth daily.  . isosorbide mononitrate (IMDUR) 30 MG 24 hr tablet Take 0.5 tablets (15 mg total) by mouth daily.  Marland Kitchen loratadine (CLARITIN) 10 MG tablet Take 10 mg by mouth daily.  Marland Kitchen PARoxetine (PAXIL) 20 MG tablet Take 20 mg by mouth daily.   . rosuvastatin (CRESTOR) 10 MG tablet Take 10 mg by mouth at bedtime.  . sodium bicarbonate 650 MG tablet Take 1 tablet (650 mg total) by mouth 2 (two) times daily.  . tamsulosin (FLOMAX) 0.4 MG CAPS capsule Take 0.4 mg by mouth daily.     Allergies:   Patient has no known allergies.   Social History   Tobacco Use  . Smoking status: Current Every Day Smoker    Types: Cigarettes  . Smokeless tobacco: Former Systems developer  . Tobacco comment: 3 cigs/per day  Substance Use Topics  . Alcohol use: Not Currently  . Drug use: Never     Family Hx: The patient's family history includes Heart attack in his father and mother.  ROS:   Please see the history of present illness.     All other systems reviewed and are negative.   Prior CV studies:   The following studies were reviewed today:  Limited Echo: 11/2019 IMPRESSIONS    1. Left ventricular ejection fraction, by estimation, is 40%. The left  ventricle demonstrates global hypokinesis.  2. The inferior vena cava is normal in size with greater than 50%  respiratory variability, suggesting right atrial pressure of 3 mmHg.  3. Limited echo to evaluate LV function. Echocontrast was used to  enhance  visualization.   Event Monitor: 11/2019 1. NSR/ST 2. No arrythmias  Labs/Other Tests and Data Reviewed:    EKG:  An ECG dated 01/01/2020 was personally reviewed today and demonstrated:  NSR, HR 73 with no acute ST abnormalities.   Recent Labs: 09/12/2019: TSH 3.292 12/11/2019: B Natriuretic Peptide 1,010.0 12/21/2019: Magnesium 2.2 01/01/2020: ALT 20; BUN 44; Creatinine, Ser 2.58; Hemoglobin 8.7; Platelets 398; Potassium 4.3; Sodium 130   Recent Lipid Panel No results found for: CHOL, TRIG, HDL, CHOLHDL, LDLCALC, LDLDIRECT  Wt Readings from Last 3 Encounters:  01/20/20 109 lb (49.4 kg)  12/21/19 122 lb 12.7 oz (55.7 kg)  11/17/19 125 lb 12.8 oz (57.1 kg)     Objective:    Vital Signs:  BP 113/80   Pulse 73   Temp (!) 97.2 F (36.2 C)   Wt 109 lb (49.4 kg)   SpO2  95%   BMI 19.31 kg/m    General: Pleasant male sounding in NAD Psych: Normal affect. Neuro: Alert and oriented X 3. Lungs:  Resp regular and unlabored while talking on the phone.   ASSESSMENT & PLAN:    1. Subsequent Episode of Care for NSTEMI - The patient was recently admitted for an NSTEMI in 11/2019 with peak troponin of 4848.  Cardiac catheterization was not pursued given his AKI and he was found to be Covid positive during admission. - He denies any recent chest pain and reports his breathing has overall been at baseline. Recent labs did show creatinine was elevated at 2.58. Given his asymptomatic state and worsening renal function, would not plan to pursue a cardiac catheterization at this time due to the risk of contrast-induced nephropathy. Will arrange for a repeat BMET.  - continue ASA 81mg  daily, Coreg 3.125mg  BID and Crestor 10mg  daily.   2. Chronic Combined Systolic and Diastolic CHF - EF previously 45-50% by echocardiogram in 08/2019, at 40% by repeat imaging in 11/2019.  Weight has declined by 10 pounds since being discharged from the hospital. He was on Lasix 60 mg daily but this was  discontinued for unclear reasons. I did speak with his nurse today and he has not demonstrated volume overload and denies any respiratory symptoms. Will check a BNP and BMET as I suspect he will require even a small dose of standing Lasix given his LV dysfunction. - continue Coreg and Imdur. No longer on Hydralazine due to presumed hypotension. Not on ACE-I/ARB/ARNI given renal dysfunction.   3. Paroxysmal Atrial Fibrillation - He denies any recent palpitations and heart rate remains well controlled. Remains on Coreg 3.125 mg twice daily for rate control. - He denies any evidence of active bleeding remains on Eliquis 2.5 mg twice daily (reduced dosing given renal function and weight).   4. HTN - BP was at 113/80 on most recent check. He remains on Coreg 3.125 mg twice daily and Imdur 15 mg daily but Hydralazine was discontinued since hospital discharge. Suspect this was secondary to hypotension.  5. HLD - Followed by PCP. Goal LDL is less than 70 in the setting of presumed CAD. He remains on Crestor 10mg  daily.   6. Stage 4 CKD - creatinine variable from 1.97 to 2.32 during recent admission. Elevated at 2.58 on 01/01/2020. Will recheck a BMET.   7. Anemia - Occurring in the setting of CKD. Hemoglobin at 8.7 by recent labs on 01/01/2020. He denies any evidence of active bleeding. Remains on ASA 81mg  daily and Eliquis.    COVID-19 Education: The signs and symptoms of COVID-19 were discussed with the patient and how to seek care for testing (follow up with PCP or arrange E-visit).  The importance of social distancing was discussed today.  Time:   Today, I have spent 18 minutes with the patient with telehealth technology discussing the above problems.     Medication Adjustments/Labs and Tests Ordered: Current medicines are reviewed at length with the patient today.  Concerns regarding medicines are outlined above.   Tests Ordered: Orders Placed This Encounter  Procedures  . B Nat Peptide  .  Basic Metabolic Panel (BMET)    Medication Changes: No orders of the defined types were placed in this encounter.   Follow Up:  Virtual Visit  in 6-8 week(s)  Signed, Erma Heritage, PA-C  01/20/2020 4:53 PM    [facility]

## 2020-01-20 NOTE — Patient Instructions (Signed)
Medication Instructions:  Your physician recommends that you continue on your current medications as directed. Please refer to the Current Medication list given to you today.  *If you need a refill on your cardiac medications before your next appointment, please call your pharmacy*   Lab Work: Your physician recommends that you return for lab work in: Today   If you have labs (blood work) drawn today and your tests are completely normal, you will receive your results only by: Marland Kitchen MyChart Message (if you have MyChart) OR . A paper copy in the mail If you have any lab test that is abnormal or we need to change your treatment, we will call you to review the results.   Testing/Procedures: NONE   Follow-Up: At [facility], you and your health needs are our priority.  As part of our continuing mission to provide you with exceptional heart care, we have created designated Provider Care Teams.  These Care Teams include your primary Cardiologist (physician) and Advanced Practice Providers (APPs -  Physician Assistants and Nurse Practitioners) who all work together to provide you with the care you need, when you need it.  We recommend signing up for the patient portal called "MyChart".  Sign up information is provided on this After Visit Summary.  MyChart is used to connect with patients for Virtual Visits (Telemedicine).  Patients are able to view lab/test results, encounter notes, upcoming appointments, etc.  Non-urgent messages can be sent to your provider as well.   To learn more about what you can do with MyChart, go to [email or web address].    Your next appointment:   6-8 week(s)  The format for your next appointment:   Virtual Visit   Provider:   Bernerd Pho, PA-C Dr. Gwenlyn Found    Other Instructions Thank you for choosing [facility]!

## 2020-02-11 ENCOUNTER — Ambulatory Visit: Payer: Medicare PPO | Admitting: Neurology

## 2020-02-11 ENCOUNTER — Telehealth: Payer: Self-pay

## 2020-02-11 NOTE — Telephone Encounter (Signed)
PT cancel same day for new pt appt. They r/s per phone room.

## 2020-03-15 ENCOUNTER — Telehealth: Payer: Medicare PPO | Admitting: Cardiovascular Disease

## 2020-03-24 ENCOUNTER — Encounter: Payer: Self-pay | Admitting: Neurology

## 2020-03-24 ENCOUNTER — Other Ambulatory Visit: Payer: Self-pay

## 2020-03-24 ENCOUNTER — Ambulatory Visit: Payer: Medicare PPO | Admitting: Neurology

## 2020-03-24 VITALS — BP 115/69 | HR 70 | Ht 63.0 in | Wt 123.0 lb

## 2020-03-24 DIAGNOSIS — Z8673 Personal history of transient ischemic attack (TIA), and cerebral infarction without residual deficits: Secondary | ICD-10-CM

## 2020-03-24 DIAGNOSIS — R569 Unspecified convulsions: Secondary | ICD-10-CM | POA: Diagnosis not present

## 2020-03-24 DIAGNOSIS — I69352 Hemiplegia and hemiparesis following cerebral infarction affecting left dominant side: Secondary | ICD-10-CM

## 2020-03-24 DIAGNOSIS — R55 Syncope and collapse: Secondary | ICD-10-CM

## 2020-03-24 NOTE — Progress Notes (Signed)
[facility] [address] [place]. [place] [address] [phone]       OFFICE CONSULT NOTE  Mr. Graison Leinberger Date of Birth:  03/11/1946 Medical Record Number:  [id number]   Referring MD: Benedetto Goad, PA-C  Reason for Referral: Syncope versus seizure  HPI: Ms. Lumpkin is a 74 year old Caucasian lady seen today for initial office consultation visit.  She is accompanied by her son today.  History is obtained from them, review of electronic medical records and I personally reviewed available imaging films in PACS.  She has past medical history of atrial fibrillation, hypertension, chronic kidney disease she states she has been having episodes of brief loss of consciousness.  This happened suddenly patient is staring at times with a blank expression her body tends to stiffen and she is briefly not aware of her surroundings.  This started in September of last year after she had moved here from [place] to be closer to her son.  She had 2 episodes in the cardiology office in [place].  She states during the episode she may sometimes be short of breath or vomit and feels disoriented for a little while.  She is currently living in the skilled section at [facility].  She is getting physical occupational speech therapy.  She is on Eliquis for A. fib and tolerating well without bruising or bleeding.  She was recently seen in the ER for 3 episodes of vomiting and found to have UTI.  She is on Keflex for that.  She had outpatient cardiac monitoring done by her cardiologist and no significant arrhythmias were found.  Echocardiogram on 12/11/2019 showed left ventricular ejection fraction of 40% with global hypokinesis but no clot.  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  The patient son was has weakness several episodes states that she is briefly unresponsive and has a blank staring expression with body stiffness his eyes rolling up.  She does  not have any tongue bite or tonic-clonic activity or incontinence.  She is disoriented only for a short while and is back to baseline during these episodes.  There are no obvious triggers noted.  CT scan of the head on 09/11/2019 had shown no acute findings and mild age-appropriate changes of chronic small vessel disease and atrophy.  She has a history of right brain stroke in 2017 in [place] and I do not have those records to review today.  She has mild residual left hemiparesis with spasticity but is able to ambulate independently.  ROS:   14 system review of systems is positive for loss of consciousness, body stiffness, gait ataxia, falls, blackout all other systems negative  PMH:  Past Medical History:  Diagnosis Date  . Acute metabolic encephalopathy   . AKI (acute kidney injury) ([place])   . Atrial fibrillation ([place])   . Chronic hyponatremia   . COVID-19   . Dysphagia   . Essential hypertension   . Hyponatremia   . Kidney failure   . Respiratory failure ([place])   . Rhabdomyolysis   . Stroke ([place])   . Tobacco use     Social History:  Social History   Socioeconomic History  . Marital status: Married    Spouse name: Not on file  . Number of children: Not on file  . Years of education: Not on file  . Highest education level: Not on file  Occupational History  . Not on file  Tobacco Use  . Smoking  status: Former Smoker    Types: Cigarettes  . Smokeless tobacco: Former Systems developer  . Tobacco comment: 3 cigs/per day  Vaping Use  . Vaping Use: Never used  Substance and Sexual Activity  . Alcohol use: Not Currently  . Drug use: Never  . Sexual activity: Not on file  Other Topics Concern  . Not on file  Social History Narrative  . Not on file   Social Determinants of Health   Financial Resource Strain:   . Difficulty of Paying Living Expenses:   Food Insecurity:   . Worried About Charity fundraiser in the Last Year:   . Arboriculturist in the Last Year:   Transportation  Needs:   . Film/video editor (Medical):   Marland Kitchen Lack of Transportation (Non-Medical):   Physical Activity:   . Days of Exercise per Week:   . Minutes of Exercise per Session:   Stress:   . Feeling of Stress :   Social Connections:   . Frequency of Communication with Friends and Family:   . Frequency of Social Gatherings with Friends and Family:   . Attends Religious Services:   . Active Member of Clubs or Organizations:   . Attends Archivist Meetings:   Marland Kitchen Marital Status:   Intimate Partner Violence:   . Fear of Current or Ex-Partner:   . Emotionally Abused:   Marland Kitchen Physically Abused:   . Sexually Abused:     Medications:   Current Outpatient Medications on File Prior to Visit  Medication Sig Dispense Refill  . acetaminophen (TYLENOL) 325 MG tablet Take 650 mg by mouth 3 (three) times daily.    Marland Kitchen albuterol (VENTOLIN HFA) 108 (90 Base) MCG/ACT inhaler Inhale 2 puffs into the lungs every 4 (four) hours as needed for wheezing or shortness of breath.     Marland Kitchen apixaban (ELIQUIS) 2.5 MG TABS tablet Take 1 tablet (2.5 mg total) by mouth 2 (two) times daily. 60 tablet   . aspirin EC 81 MG tablet Take 81 mg by mouth every morning.    . carvedilol (COREG) 3.125 MG tablet Take 1 tablet (3.125 mg total) by mouth 2 (two) times daily with a meal.    . cephALEXin (KEFLEX) 500 MG capsule Take 1 capsule (500 mg total) by mouth 4 (four) times daily. 28 capsule 0  . cholecalciferol (VITAMIN D3) 25 MCG (1000 UT) tablet Take 1,000 Units by mouth daily.    . famotidine (PEPCID) 20 MG tablet Take 20 mg by mouth daily.    . furosemide (LASIX) 40 MG tablet Take 1.5 tablets (60 mg total) by mouth daily. 30 tablet   . hydrALAZINE (APRESOLINE) 25 MG tablet Take 1.5 tablets (37.5 mg total) by mouth 3 (three) times daily.    . isosorbide mononitrate (IMDUR) 30 MG 24 hr tablet Take 0.5 tablets (15 mg total) by mouth daily.    Marland Kitchen loratadine (CLARITIN) 10 MG tablet Take 10 mg by mouth daily.    . ondansetron  (ZOFRAN ODT) 4 MG disintegrating tablet 4mg  ODT q4 hours prn nausea/vomit 10 tablet 0  . PARoxetine (PAXIL) 20 MG tablet Take 20 mg by mouth daily.     . rosuvastatin (CRESTOR) 10 MG tablet Take 10 mg by mouth at bedtime.    . sodium bicarbonate 650 MG tablet Take 1 tablet (650 mg total) by mouth 2 (two) times daily.    . tamsulosin (FLOMAX) 0.4 MG CAPS capsule Take 0.4 mg by mouth daily.  No current facility-administered medications on file prior to visit.    Allergies:  No Known Allergies  Physical Exam General: Frail cachectic malnourished looking Caucasian male, seated, in no evident distress Head: head normocephalic and atraumatic.   Neck: supple with soft right carotid bruit.   Cardiovascular: regular rate and rhythm, no murmurs Musculoskeletal: no deformity Skin:  no rash/petichiae Vascular:  Normal pulses all extremities  Neurologic Exam Mental Status: Awake and fully alert. Oriented to place and time. Recent and remote memory poor attention span, concentration and fund of knowledge diminished mood and affect appropriate.  Diminished recall 0/3. Cranial Nerves: Fundoscopic exam reveals sharp disc margins. Pupils equal, briskly reactive to light. Extraocular movements full without nystagmus. Visual fields full to confrontation. Hearing diminished bilaterally facial sensation intact. Face, tongue, palate moves normally and symmetrically.  Motor: Spastic left hemiparesis with 4/5 left-sided weakness with significant weakness of left grip intrinsic hand muscles and left ankle dorsiflexors.  Tone is increased on the left with spasticity in the left leg.  Normal strength on the right. Sensory.: intact to touch , pinprick , position and vibratory sensation.  Coordination: Impaired finger-to-nose and knee to heel coordination on the left and normal on the right.   Gait and Station: Arises from chair with  difficulty. Stance is stooped .gait demonstrates initiation apraxia with stiffness  and dragging of the left leg with imbalance.   Reflexes: 2+ and asymmetric and brisker on the left. Toes downgoing.   NIHSS  4 Modified [place]  4   ASSESSMENT: 74 year old Caucasian male with recurrent stereotypical episodes of brief loss of consciousness since September 2021 possibly syncopal event versus complex partial seizures.  Doubt vertebrobasilar TIAs but she does have remote history of right brain likely subcortical infarct in 2017 with residual spastic left hemiparesis.  Vascular risk factors of atrial fibrillation, hyperlipidemia and old stroke     PLAN: I had a long discussion with the patient and his son about his recurrent stereotypical episodes of brief loss of consciousness representing possibly syncopal event versus seizures and I doubt TIAs.  Recommend further evaluation by checking EEG, MRI scan of the brain, MRA of the brain and neck.  Check lipid profile and hemoglobin A1c.  Continue Eliquis for stroke prevention for A. fib and maintain aggressive risk factor modification with strict control of hypertension with blood pressure goal below 130/90, lipids with LDL cholesterol goal below 70 mg percent and hemoglobin A1c goal below 6.5%.  He was advised to drink adequate fluids and avoid dehydration.  Continue ongoing physical occupational and speech therapy at [facility].  Greater than 50% time during this 45-minute consultation was it was spent on counseling and coordination of care about syncope and seizures and stroke prevention and answering questions.  He will return for follow-up in the future in 3 months or call earlier if necessary. Antony Contras, MD  [facility] [address] [place], [place] [address]  Phone [phone] Fax [phone] Note: This document was prepared with digital dictation and possible smart phrase technology. Any transcriptional errors that result from this process are unintentional.

## 2020-03-24 NOTE — Patient Instructions (Signed)
I had a long discussion with the patient and his son about his recurrent stereotypical episodes of brief loss of consciousness representing possibly syncopal event versus seizures and I doubt TIAs.  Recommend further evaluation by checking EEG, MRI scan of the brain, MRA of the brain and neck.  Check lipid profile and hemoglobin A1c.  Continue Eliquis for stroke prevention for A. fib and maintain aggressive risk factor modification with strict control of hypertension with blood pressure goal below 130/90, lipids with LDL cholesterol goal below 70 mg percent and hemoglobin A1c goal below 6.5%.  He was advised to drink adequate fluids and avoid dehydration.  Continue ongoing physical occupational and speech therapy at [facility].  He will return for follow-up in the future in 3 months or call earlier if necessary.  Stroke Prevention Some medical conditions and behaviors are associated with a higher chance of having a stroke. You can help prevent a stroke by making nutrition, lifestyle, and other changes, including managing any medical conditions you may have. What nutrition changes can be made?   Eat healthy foods. You can do this by: ? Choosing foods high in fiber, such as fresh fruits and vegetables and whole grains. ? Eating at least 5 or more servings of fruits and vegetables a day. Try to fill half of your plate at each meal with fruits and vegetables. ? Choosing lean protein foods, such as lean cuts of meat, poultry without skin, fish, tofu, beans, and nuts. ? Eating low-fat dairy products. ? Avoiding foods that are high in salt (sodium). This can help lower blood pressure. ? Avoiding foods that have saturated fat, trans fat, and cholesterol. This can help prevent high cholesterol. ? Avoiding processed and premade foods.  Follow your health care provider's specific guidelines for losing weight, controlling high blood pressure (hypertension), lowering high cholesterol, and managing diabetes.  These may include: ? Reducing your daily calorie intake. ? Limiting your daily sodium intake to 1,500 milligrams (mg). ? Using only healthy fats for cooking, such as olive oil, canola oil, or sunflower oil. ? Counting your daily carbohydrate intake. What lifestyle changes can be made?  Maintain a healthy weight. Talk to your health care provider about your ideal weight.  Get at least 30 minutes of moderate physical activity at least 5 days a week. Moderate activity includes brisk walking, biking, and swimming.  Do not use any products that contain nicotine or tobacco, such as cigarettes and e-cigarettes. If you need help quitting, ask your health care provider. It may also be helpful to avoid exposure to secondhand smoke.  Limit alcohol intake to no more than 1 drink a day for nonpregnant women and 2 drinks a day for men. One drink equals 12 oz of beer, 5 oz of wine, or 1 oz of hard liquor.  Stop any illegal drug use.  Avoid taking birth control pills. Talk to your health care provider about the risks of taking birth control pills if: ? You are over 71 years old. ? You smoke. ? You get migraines. ? You have ever had a blood clot. What other changes can be made?  Manage your cholesterol levels. ? Eating a healthy diet is important for preventing high cholesterol. If cholesterol cannot be managed through diet alone, you may also need to take medicines. ? Take any prescribed medicines to control your cholesterol as told by your health care provider.  Manage your diabetes. ? Eating a healthy diet and exercising regularly are important parts of managing  your blood sugar. If your blood sugar cannot be managed through diet and exercise, you may need to take medicines. ? Take any prescribed medicines to control your diabetes as told by your health care provider.  Control your hypertension. ? To reduce your risk of stroke, try to keep your blood pressure below 130/80. ? Eating a healthy  diet and exercising regularly are an important part of controlling your blood pressure. If your blood pressure cannot be managed through diet and exercise, you may need to take medicines. ? Take any prescribed medicines to control hypertension as told by your health care provider. ? Ask your health care provider if you should monitor your blood pressure at home. ? Have your blood pressure checked every year, even if your blood pressure is normal. Blood pressure increases with age and some medical conditions.  Get evaluated for sleep disorders (sleep apnea). Talk to your health care provider about getting a sleep evaluation if you snore a lot or have excessive sleepiness.  Take over-the-counter and prescription medicines only as told by your health care provider. Aspirin or blood thinners (antiplatelets or anticoagulants) may be recommended to reduce your risk of forming blood clots that can lead to stroke.  Make sure that any other medical conditions you have, such as atrial fibrillation or atherosclerosis, are managed. What are the warning signs of a stroke? The warning signs of a stroke can be easily remembered as BEFAST.  B is for balance. Signs include: ? Dizziness. ? Loss of balance or coordination. ? Sudden trouble walking.  E is for eyes. Signs include: ? A sudden change in vision. ? Trouble seeing.  F is for face. Signs include: ? Sudden weakness or numbness of the face. ? The face or eyelid drooping to one side.  A is for arms. Signs include: ? Sudden weakness or numbness of the arm, usually on one side of the body.  S is for speech. Signs include: ? Trouble speaking (aphasia). ? Trouble understanding.  T is for time. ? These symptoms may represent a serious problem that is an emergency. Do not wait to see if the symptoms will go away. Get medical help right away. Call your local emergency services (911 in the [place] Do not drive yourself to the hospital.  Other signs of  stroke may include: ? A sudden, severe headache with no known cause. ? Nausea or vomiting. ? Seizure. Where to find more information For more information, visit:  American Stroke Association: www.strokeassociation.org  National Stroke Association: www.stroke.org Summary  You can prevent a stroke by eating healthy, exercising, not smoking, limiting alcohol intake, and managing any medical conditions you may have.  Do not use any products that contain nicotine or tobacco, such as cigarettes and e-cigarettes. If you need help quitting, ask your health care provider. It may also be helpful to avoid exposure to secondhand smoke.  Remember BEFAST for warning signs of stroke. Get help right away if you or a loved one has any of these signs. This information is not intended to replace advice given to you by your health care provider. Make sure you discuss any questions you have with your health care provider. Document Revised: 08/23/2017 Document Reviewed: 10/16/2016 Elsevier Patient Education  [place].  Syncope Syncope is when you pass out (faint) for a short time. It is caused by a sudden decrease in blood flow to the brain. Signs that you may be about to pass out include:  Feeling dizzy or light-headed.  Feeling sick to your stomach (nauseous).  Seeing all white or all black.  Having cold, clammy skin. If you pass out, get help right away. Call your local emergency services (911 in the [place] Do not drive yourself to the hospital. Follow these instructions at home: Watch for any changes in your symptoms. Take these actions to stay safe and help with your symptoms: Lifestyle  Do not drive, use machinery, or play sports until your doctor says it is okay.  Do not drink alcohol.  Do not use any products that contain nicotine or tobacco, such as cigarettes and e-cigarettes. If you need help quitting, ask your doctor.  Drink enough fluid to keep your pee (urine) pale  yellow. General instructions  Take over-the-counter and prescription medicines only as told by your doctor.  If you are taking blood pressure or heart medicine, sit up and stand up slowly. Spend a few minutes getting ready to sit and then stand. This can help you feel less dizzy.  Have someone stay with you until you feel stable.  If you start to feel like you might pass out, lie down right away and raise (elevate) your feet above the level of your heart. Breathe deeply and steadily. Wait until all of the symptoms are gone.  Keep all follow-up visits as told by your doctor. This is important. Get help right away if:  You have a very bad headache.  You pass out once or more than once.  You have pain in your chest, belly, or back.  You have a very fast or uneven heartbeat (palpitations).  It hurts to breathe.  You are bleeding from your mouth or your bottom (rectum).  You have black or tarry poop (stool).  You have jerky movements that you cannot control (seizure).  You are confused.  You have trouble walking.  You are very weak.  You have vision problems. These symptoms may be an emergency. Do not wait to see if the symptoms will go away. Get medical help right away. Call your local emergency services (911 in the [place] Do not drive yourself to the hospital. Summary  Syncope is when you pass out (faint) for a short time. It is caused by a sudden decrease in blood flow to the brain.  Signs that you may be about to faint include feeling dizzy, light-headed, or sick to your stomach, seeing all white or all black, or having cold, clammy skin.  If you start to feel like you might pass out, lie down right away and raise (elevate) your feet above the level of your heart. Breathe deeply and steadily. Wait until all of the symptoms are gone. This information is not intended to replace advice given to you by your health care provider. Make sure you discuss any questions you have  with your health care provider. Document Revised: 10/23/2017 Document Reviewed: 10/23/2017 Elsevier Patient Education  2020 [employer].

## 2020-03-25 LAB — LIPID PANEL
Chol/HDL Ratio: 3.2 ratio (ref 0.0–5.0)
Cholesterol, Total: 122 mg/dL (ref 100–199)
HDL: 38 mg/dL — ABNORMAL LOW (ref >39)
LDL Chol Calc (NIH): 66 mg/dL (ref 0–99)
Triglycerides: 97 mg/dL (ref 0–149)
VLDL Cholesterol Cal: 18 mg/dL (ref 5–40)

## 2020-03-25 LAB — HEMOGLOBIN A1C
Est. average glucose Bld gHb Est-mCnc: 100 mg/dL
Hgb A1c MFr Bld: 5.1 % (ref 4.8–5.6)

## 2020-03-29 ENCOUNTER — Other Ambulatory Visit: Payer: Self-pay

## 2020-03-29 ENCOUNTER — Telehealth: Payer: Self-pay | Admitting: Neurology

## 2020-03-29 DIAGNOSIS — R55 Syncope and collapse: Secondary | ICD-10-CM

## 2020-03-29 NOTE — Telephone Encounter (Signed)
When you get a chance can you switch the MRA order to a MRA head wo contrast & MRA Neck w/wo contrast. Thank you!

## 2020-03-29 NOTE — Telephone Encounter (Signed)
Orders change for pt per [facility] MRI coordinator.

## 2020-03-30 NOTE — Telephone Encounter (Signed)
[employer] pending faxed notes.  

## 2020-04-04 NOTE — Telephone Encounter (Signed)
I called to check the status they received my fax of clinical notes . They wanted more information. I informed them I don't have anything else they are going to to move it to the the MD review.. This is for the MRA head.

## 2020-04-05 NOTE — Telephone Encounter (Signed)
Anthony Andrews: [id number] (exp. 04/04/20 to 05/04/20) order sent to GI. They will reach out to the patient to schedule.

## 2020-04-10 NOTE — Progress Notes (Signed)
Kindly inform the patient that cholesterol profile was satisfactory and screening test for diabetes was normal

## 2020-04-11 ENCOUNTER — Telehealth: Payer: Self-pay | Admitting: Neurology

## 2020-04-11 ENCOUNTER — Telehealth: Payer: Self-pay | Admitting: *Deleted

## 2020-04-11 NOTE — Telephone Encounter (Signed)
-----   Message from Garvin Fila, MD sent at 04/10/2020  4:36 PM EDT ----- Anthony Andrews inform the patient that cholesterol profile was satisfactory and screening test for diabetes was normal

## 2020-04-11 NOTE — Telephone Encounter (Signed)
Called the number provided and it goes to a facility. I was unable to get hold of the patient due to being transferred multiple times. If patient calls in for lab results please advise that lab results were normal.

## 2020-04-11 NOTE — Telephone Encounter (Signed)
The patient resides at [facility]. I left a message for a nurse to call me back in order to verbally provide the lab results. I have also faxed them to the facility at [phone].

## 2020-04-11 NOTE — Telephone Encounter (Signed)
I called [facility] again and spoke to Clovis Pu, who is a member of the patient's care team. She was provided with the result below and verbalized understanding.

## 2020-04-12 NOTE — Telephone Encounter (Signed)
Error

## 2020-04-14 ENCOUNTER — Ambulatory Visit: Payer: Medicare PPO | Admitting: Neurology

## 2020-04-14 DIAGNOSIS — R55 Syncope and collapse: Secondary | ICD-10-CM

## 2020-04-14 DIAGNOSIS — R569 Unspecified convulsions: Secondary | ICD-10-CM

## 2020-04-21 ENCOUNTER — Telehealth: Payer: Self-pay | Admitting: Neurology

## 2020-04-21 NOTE — Telephone Encounter (Signed)
Called the patient and he is in a nursing facility. They will have the nurse call us back to review the results with her.   When the nurse calls back please advise the EEG results were normal. Nothing that appeared concerning.

## 2020-04-21 NOTE — Progress Notes (Signed)
Kindly inform the patient that EEG study was normal

## 2020-04-21 NOTE — Telephone Encounter (Signed)
Anthony Andrews, Anthony Andrews) called advised that EEG results were normal, nothing appeared concerning.

## 2020-04-21 NOTE — Telephone Encounter (Signed)
-----   Message from Garvin Fila, MD sent at 04/21/2020  9:14 AM EDT ----- Anthony Andrews inform the patient that EEG study was normal

## 2020-06-02 ENCOUNTER — Ambulatory Visit: Payer: Medicare PPO | Admitting: Physician Assistant

## 2020-06-16 ENCOUNTER — Ambulatory Visit: Payer: Medicare PPO | Admitting: Physician Assistant

## 2020-06-23 ENCOUNTER — Telehealth (INDEPENDENT_AMBULATORY_CARE_PROVIDER_SITE_OTHER): Payer: Medicare PPO | Admitting: Cardiology

## 2020-06-23 ENCOUNTER — Encounter: Payer: Self-pay | Admitting: Cardiology

## 2020-06-23 VITALS — BP 134/78 | HR 79 | Temp 97.6°F | Resp 16 | Ht 63.0 in | Wt 126.0 lb

## 2020-06-23 DIAGNOSIS — I480 Paroxysmal atrial fibrillation: Secondary | ICD-10-CM | POA: Diagnosis not present

## 2020-06-23 DIAGNOSIS — I214 Non-ST elevation (NSTEMI) myocardial infarction: Secondary | ICD-10-CM | POA: Diagnosis not present

## 2020-06-23 DIAGNOSIS — I10 Essential (primary) hypertension: Secondary | ICD-10-CM

## 2020-06-23 DIAGNOSIS — N183 Chronic kidney disease, stage 3 unspecified: Secondary | ICD-10-CM | POA: Diagnosis not present

## 2020-06-23 DIAGNOSIS — Z7901 Long term (current) use of anticoagulants: Secondary | ICD-10-CM

## 2020-06-23 DIAGNOSIS — Z720 Tobacco use: Secondary | ICD-10-CM

## 2020-06-23 NOTE — Progress Notes (Signed)
Virtual Visit via Telephone Note   This visit type was conducted due to national recommendations for restrictions regarding the COVID-19 Pandemic (e.g. social distancing) in an effort to limit this patient's exposure and mitigate transmission in our community.  Due to his co-morbid illnesses, this patient is at least at moderate risk for complications without adequate follow up.  This format is felt to be most appropriate for this patient at this time.  The patient did not have access to video technology/had technical difficulties with video requiring transitioning to audio format only (telephone).  All issues noted in this document were discussed and addressed.  No physical exam could be performed with this format.  Please refer to the patient's chart for his  consent to telehealth for [facility]    Date:  06/23/2020   ID:  Anthony Andrews, DOB 1946/01/24, MRN [id number] The patient was identified using 2 identifiers.  Patient Location: [place] Provider Location: Office/Clinic  PCP:  [name], MD  Cardiologist:  Quay Burow, MD  Electrophysiologist:  None   Evaluation Performed:  Follow-Up Visit  Chief Complaint:  none  History of Present Illness:    Coleton Woon is a 74 y.o. male initially seen by cardiology in consult dec 2020 after a fall. 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  During that hospitalization he had acute kidney injury and it was decided not to pursue diagnostic catheterization.  Echocardiogram showed an EF of 40% and he responded well to medical therapy.  He was tested for Covid during that admission and it came back positive.  He had a virtual visit in April at the nursing home and seem to be doing well.  He was contacted today for routine follow-up.  We initially  tried to do a video visit but secondary to technical issues as we changed to a phone call.  I spoke with the patient and I spoke with the patient's nurse.  She tells me overall he seems to be doing okay, he still smokes every day.  The patient denies any chest pain or any issues with his medications.  He is unaware of any palpitations or tachycardia.  The patient does not have symptoms concerning for COVID-19 infection (fever, chills, cough, or new shortness of breath).    Past Medical History:  Diagnosis Date  . Acute metabolic encephalopathy   . AKI (acute kidney injury) ([place])   . Atrial fibrillation ([place])   . Chronic hyponatremia   . COVID-19   . Dysphagia   . Essential hypertension   . Hyponatremia   . Kidney failure   . Respiratory failure ([place])   . Rhabdomyolysis   . Stroke ([place])   . Tobacco use    Past Surgical History:  Procedure Laterality Date  . BACK SURGERY    . SHOULDER SURGERY       Current Meds  Medication Sig  . acetaminophen (TYLENOL) 325 MG tablet Take 650 mg by mouth 3 (three) times daily.  Marland Kitchen apixaban (ELIQUIS) 2.5 MG TABS tablet Take 1 tablet (2.5 mg total) by mouth 2 (two) times daily.  Marland Kitchen ascorbic acid (VITAMIN C) 500 MG tablet Take 500 mg by mouth daily.  Marland Kitchen aspirin EC 81 MG tablet Take 81 mg by mouth every morning.  . carvedilol (COREG) 3.125  MG tablet Take 1 tablet (3.125 mg total) by mouth 2 (two) times daily with a meal.  . Cholecalciferol (VITAMIN D) 125 MCG (5000 UT) CAPS Take 5,000 Units by mouth daily.   . famotidine (PEPCID) 20 MG tablet Take 20 mg by mouth daily.  Marland Kitchen loratadine (CLARITIN) 10 MG tablet Take 10 mg by mouth daily.  Marland Kitchen PARoxetine (PAXIL) 20 MG tablet Take 20 mg by mouth daily.   . rosuvastatin (CRESTOR) 10 MG tablet Take 10 mg by mouth at bedtime.  . sodium bicarbonate 650 MG tablet Take 1 tablet (650 mg total) by mouth 2 (two) times daily.  . tamsulosin (FLOMAX) 0.4 MG CAPS capsule Take 0.4 mg by mouth daily.     Allergies:    Patient has no known allergies.   Social History   Tobacco Use  . Smoking status: Former Smoker    Types: Cigarettes  . Smokeless tobacco: Former Systems developer  . Tobacco comment: 3 cigs/per day  Vaping Use  . Vaping Use: Never used  Substance Use Topics  . Alcohol use: Not Currently  . Drug use: Never     Family Hx: The patient's family history includes Heart attack in his father and mother.  ROS:   Please see the history of present illness.    All other systems reviewed and are negative.   Prior CV studies:   The following studies were reviewed today:  Echo 12/11/2019- FINDINGS  Left Ventricle: Left ventricular ejection fraction, by estimation, is  40%. The left ventricle demonstrates global hypokinesis. Definity contrast  agent was given IV to delineate the left ventricular endocardial borders.   Pericardium: Trivial pericardial effusion is present. The pericardial  effusion is circumferential.   Venous: The inferior vena cava is normal in size with greater than 50%  respiratory variability, suggesting right atrial pressure of 3 mmHg.   Op Monitor 01/01/2020- NSR  Labs/Other Tests and Data Reviewed:    EKG:  An ECG dated 01/01/2020 was personally reviewed today and demonstrated:  NSR- 73  Recent Labs: 09/12/2019: TSH 3.292 12/11/2019: B Natriuretic Peptide 1,010.0 12/21/2019: Magnesium 2.2 01/01/2020: ALT 20; BUN 44; Creatinine, Ser 2.58; Hemoglobin 8.7; Platelets 398; Potassium 4.3; Sodium 130   Recent Lipid Panel Lab Results  Component Value Date/Time   CHOL 122 03/24/2020 09:20 AM   TRIG 97 03/24/2020 09:20 AM   HDL 38 (L) 03/24/2020 09:20 AM   CHOLHDL 3.2 03/24/2020 09:20 AM   LDLCALC 66 03/24/2020 09:20 AM    Wt Readings from Last 3 Encounters:  06/23/20 126 lb (57.2 kg)  03/24/20 123 lb (55.8 kg)  01/20/20 109 lb (49.4 kg)     Objective:    Vital Signs:  BP 134/78   Pulse 79   Temp 97.6 F (36.4 C)   Resp 16   Ht 5\' 3"  (1.6 m)   Wt 126 lb (57.2  kg)   SpO2 96%   BMI 22.32 kg/m    VITAL SIGNS:  reviewed  ASSESSMENT & PLAN:    NSTEMI- March 2021. Complicated by PAF, CHF, AKI- in setting of COVID-19 infection.-Medical Rx.  ICM- EF 40%, no history currently of CHF symptoms  PAF- Monitor April 2021 showed NSR. He remians on low dose Eliquis  CRI- Last SCr in Epic showed a GFR of 24- he is currently being managed by the SNF.  Plan: Same Rx.  F/U Dr Gwenlyn Found in 6 months- can be virtual.  COVID-19 Education: The signs and symptoms of COVID-19 were discussed with the  patient and how to seek care for testing (follow up with PCP or arrange E-visit).  The importance of social distancing was discussed today.  Time:   Today, I have spent 15 minutes with the patient with telehealth technology discussing the above problems.     Medication Adjustments/Labs and Tests Ordered: Current medicines are reviewed at length with the patient today.  Concerns regarding medicines are outlined above.   Tests Ordered: No orders of the defined types were placed in this encounter.   Medication Changes: No orders of the defined types were placed in this encounter.   Follow Up:  In Person Dr Gwenlyn Found in 6 months  Signed, Kerin Ransom, Hershal Coria  06/23/2020 4:43 PM    [place]

## 2020-06-23 NOTE — Patient Instructions (Signed)

## 2020-06-27 ENCOUNTER — Ambulatory Visit: Payer: Medicare PPO | Admitting: Neurology

## 2020-08-10 ENCOUNTER — Other Ambulatory Visit (HOSPITAL_COMMUNITY): Payer: Self-pay | Admitting: Physician Assistant

## 2020-08-10 ENCOUNTER — Other Ambulatory Visit: Payer: Self-pay | Admitting: Physician Assistant

## 2020-08-10 DIAGNOSIS — J90 Pleural effusion, not elsewhere classified: Secondary | ICD-10-CM

## 2020-08-19 ENCOUNTER — Ambulatory Visit (HOSPITAL_COMMUNITY): Payer: Medicare PPO

## 2020-08-25 ENCOUNTER — Encounter: Payer: Self-pay | Admitting: Neurology

## 2020-08-25 ENCOUNTER — Ambulatory Visit: Payer: Medicare PPO | Admitting: Neurology

## 2020-08-25 ENCOUNTER — Other Ambulatory Visit: Payer: Self-pay

## 2020-08-25 VITALS — BP 98/58 | HR 81 | Ht 63.5 in | Wt 118.0 lb

## 2020-08-25 DIAGNOSIS — R6889 Other general symptoms and signs: Secondary | ICD-10-CM | POA: Diagnosis not present

## 2020-08-25 NOTE — Progress Notes (Signed)
[facility] [address] [place]. [place] [address] [phone]       OFFICE FOLLOW UP VISIT NOTE  [name] Date of Birth:  1946/07/31 Medical Record Number:  [id number]   Referring MD: Benedetto Goad, PA-C  Reason for Referral: Syncope versus seizure  HPI: Initial visit 03/24/2020 [name] is a 74 year old Caucasian lady seen today for initial office consultation visit.  She is accompanied by her son today.  History is obtained from them, review of electronic medical records and I personally reviewed available imaging films in PACS.  She has past medical history of atrial fibrillation, hypertension, chronic kidney disease she states she has been having episodes of brief loss of consciousness.  This happened suddenly patient is staring at times with a blank expression her body tends to stiffen and she is briefly not aware of her surroundings.  This started in September of last year after she had moved here from [place] to be closer to her son.  She had 2 episodes in the cardiology office in [place].  She states during the episode she may sometimes be short of breath or vomit and feels disoriented for a little while.  She is currently living in the skilled section at [facility].  She is getting physical occupational speech therapy.  She is on Eliquis for A. fib and tolerating well without bruising or bleeding.  She was recently seen in the ER for 3 episodes of vomiting and found to have UTI.  She is on Keflex for that.  She had outpatient cardiac monitoring done by her cardiologist and no significant arrhythmias were found.  Echocardiogram on 12/11/2019 showed left ventricular ejection fraction of 40% with global hypokinesis but no clot.  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  The patient son was has weakness several episodes states that she is briefly unresponsive and has a blank staring expression with body stiffness  his eyes rolling up.  She does not have any tongue bite or tonic-clonic activity or incontinence.  She is disoriented only for a short while and is back to baseline during these episodes.  There are no obvious triggers noted.  CT scan of the head on 09/11/2019 had shown no acute findings and mild age-appropriate changes of chronic small vessel disease and atrophy.  She has a history of right brain stroke in 2017 in [place] and I do not have those records to review today.  She has mild residual left hemiparesis with spasticity but is able to ambulate independently. Update 08/25/2020: He returns for follow-up after last visit 5 months ago.  Is accompanied by his son.  He has had no more episodes of brief loss of consciousness unresponsiveness however he has had overall decline in his health.  He still living at [facility] and has lost a lot of weight and his gait instability has worsened and he is unable to walk and physical therapist have stopped working with him.  He is on home oxygen due to 2 bouts of pneumonia in October and November as well as CHF.  Son is also noted increased forgetfulness and short-term memory difficulties.  He is also not had much social interaction at the skilled nursing facility.  He came home for Thanksgiving briefly and family noticed that he had significant physical decline and was barely able to walk.  Patient had EEG done on 04/18/2020 which was normal.  Lab work on 03/24/2020 showed LDL cholesterol  66 mg percent and hemoglobin A1c of 5.1.  MRI and MRAs of the brain were denied by insurance twice.  Hence not done. ROS:   14 system review of systems is positive for gait difficulty, shortness of breath loss of consciousness, body stiffness, gait ataxia, falls, blackout all other systems negative  PMH:  Past Medical History:  Diagnosis Date  . Acute metabolic encephalopathy   . AKI (acute kidney injury) ([place])   . Atrial fibrillation ([place])   . Chronic hyponatremia   . COVID-19   .  Dysphagia   . Essential hypertension   . Hyponatremia   . Kidney failure   . Respiratory failure ([place])   . Rhabdomyolysis   . Stroke ([place])   . Tobacco use     Social History:  Social History   Socioeconomic History  . Marital status: Widowed    Spouse name: Not on file  . Number of children: Not on file  . Years of education: Not on file  . Highest education level: Not on file  Occupational History  . Not on file  Tobacco Use  . Smoking status: Former Smoker    Types: Cigarettes  . Smokeless tobacco: Former Systems developer  . Tobacco comment: 3 cigs/per day  Vaping Use  . Vaping Use: Never used  Substance and Sexual Activity  . Alcohol use: Not Currently  . Drug use: Never  . Sexual activity: Not on file  Other Topics Concern  . Not on file  Social History Narrative   Resides at [facility]   Right Handed   Drinks 1-2 cups caffeine daily   Social Determinants of Health   Financial Resource Strain:   . Difficulty of Paying Living Expenses: Not on file  Food Insecurity:   . Worried About Charity fundraiser in the Last Year: Not on file  . Ran Out of Food in the Last Year: Not on file  Transportation Needs:   . Lack of Transportation (Medical): Not on file  . Lack of Transportation (Non-Medical): Not on file  Physical Activity:   . Days of Exercise per Week: Not on file  . Minutes of Exercise per Session: Not on file  Stress:   . Feeling of Stress : Not on file  Social Connections:   . Frequency of Communication with Friends and Family: Not on file  . Frequency of Social Gatherings with Friends and Family: Not on file  . Attends Religious Services: Not on file  . Active Member of Clubs or Organizations: Not on file  . Attends Archivist Meetings: Not on file  . Marital Status: Not on file  Intimate Partner Violence:   . Fear of Current or Ex-Partner: Not on file  . Emotionally Abused: Not on file  . Physically Abused: Not on file  . Sexually  Abused: Not on file    Medications:   Current Outpatient Medications on File Prior to Visit  Medication Sig Dispense Refill  . acetaminophen (TYLENOL) 325 MG tablet Take 650 mg by mouth 3 (three) times daily.    Marland Kitchen apixaban (ELIQUIS) 2.5 MG TABS tablet Take 1 tablet (2.5 mg total) by mouth 2 (two) times daily. 60 tablet   . ascorbic acid (VITAMIN C) 500 MG tablet Take 500 mg by mouth daily.    Marland Kitchen aspirin EC 81 MG tablet Take 81 mg by mouth every morning.    . carvedilol (COREG) 3.125 MG tablet Take 1 tablet (3.125 mg total) by mouth  2 (two) times daily with a meal.    . Cholecalciferol (VITAMIN D) 125 MCG (5000 UT) CAPS Take 5,000 Units by mouth daily.     . famotidine (PEPCID) 20 MG tablet Take 20 mg by mouth daily.    Marland Kitchen loratadine (CLARITIN) 10 MG tablet Take 10 mg by mouth daily.    . OXYGEN Inhale 3 L/hr into the lungs.    Marland Kitchen PARoxetine (PAXIL) 20 MG tablet Take 20 mg by mouth daily.     . rosuvastatin (CRESTOR) 10 MG tablet Take 10 mg by mouth at bedtime.    . sodium bicarbonate 650 MG tablet Take 1 tablet (650 mg total) by mouth 2 (two) times daily.    . tamsulosin (FLOMAX) 0.4 MG CAPS capsule Take 0.4 mg by mouth daily.     No current facility-administered medications on file prior to visit.    Allergies:  No Known Allergies  Physical Exam General: Frail cachectic malnourished looking Caucasian male, seated in a wheelchair, in mild respiratory distress.  He is on home oxygen. Head: head normocephalic and atraumatic.   Neck: supple with soft right carotid bruit.   Cardiovascular: regular rate and rhythm, no murmurs Musculoskeletal: no deformity Skin:  no rash/petichiae Vascular:  Normal pulses all extremities  Neurologic Exam Mental Status: Awake and fully alert. Oriented to place and time. Recent and remote memory poor attention span, concentration and fund of knowledge diminished mood and affect appropriate.  Diminished recall 0/3.  Mini-Mental status exam not done Cranial  Nerves: Fundoscopic exam not done. Pupils equal, briskly reactive to light. Extraocular movements full without nystagmus. Visual fields full to confrontation. Hearing diminished bilaterally facial sensation intact. Face, tongue, palate moves normally and symmetrically.  Motor: Spastic left hemiparesis with 4/5 left-sided weakness with significant weakness of left grip intrinsic hand muscles and left ankle dorsiflexors.  Tone is increased on the left with spasticity in the left leg.  Normal strength on the right. Sensory.: intact to touch , pinprick , position and vibratory sensation.  Coordination: Impaired finger-to-nose and knee to heel coordination on the left and normal on the right.   Gait and Station: Deferred as patient is in a wheelchair and unable to walk Reflexes: 2+ and asymmetric and brisker on the left. Toes downgoing.       ASSESSMENT: 74 year old Caucasian male with recurrent stereotypical episodes of brief loss of consciousness since September 2021 possibly syncopal event versus complex partial seizures.  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  Vascular risk factors of atrial fibrillation, hyperlipidemia and old stroke.  He has not had any further episodes in the last 9 months but has had overall general decline in his health due to recurrent bouts of pneumonia, CHF and deconditioning     PLAN: I had a long discussion with the patient and his son regarding his spells of staring and decreased attentiveness which appear to have resolved and discussed results of EEG and lab work.  His MRI scan and MRA have been denied twice by insurance and since he is doing well I will not pursue this matter further.  He seems to be deconditioned with his bouts of recurrent pneumonia and CHF and will benefit with more physical and occupational therapy for gait training.  He may return for  follow-up in the future only as needed and no routine schedule appointment was made. Greater than 50% time during this 25-minute was  spent on counseling and coordination of care about syncope and seizures and stroke prevention and answering questions.  Antony Contras, MD  [facility] [address] [place], [place] [address]  Phone [phone] Fax [phone] Note: This document was prepared with digital dictation and possible smart phrase technology. Any transcriptional errors that result from this process are unintentional.

## 2020-08-25 NOTE — Patient Instructions (Signed)
I had a long discussion with the patient and his son regarding his spells of staring and decreased attentiveness which appear to have resolved and discussed results of EEG and lab work.  His MRI scan and MRA have been denied twice by insurance and since he is doing well I will not pursue this matter further.  He seems to be deconditioned with his bouts of recurrent pneumonia and CHF and will benefit with more physical and occupational therapy for gait training.  He may return for follow-up in the future only as needed and no routine schedule appointment was made.

## 2020-08-26 ENCOUNTER — Encounter: Payer: Medicare PPO | Admitting: Physician Assistant

## 2020-08-27 NOTE — Progress Notes (Signed)
This encounter was created in error - please disregard.

## 2020-09-07 ENCOUNTER — Encounter (HOSPITAL_COMMUNITY): Payer: Self-pay

## 2020-09-07 ENCOUNTER — Ambulatory Visit (HOSPITAL_COMMUNITY): Payer: Medicare PPO

## 2020-09-28 ENCOUNTER — Ambulatory Visit (HOSPITAL_COMMUNITY): Payer: Medicare PPO

## 2020-09-28 ENCOUNTER — Encounter (HOSPITAL_COMMUNITY): Payer: Self-pay

## 2020-10-25 DIAGNOSIS — I739 Peripheral vascular disease, unspecified: Secondary | ICD-10-CM | POA: Diagnosis not present

## 2020-10-25 DIAGNOSIS — H40013 Open angle with borderline findings, low risk, bilateral: Secondary | ICD-10-CM | POA: Diagnosis not present

## 2020-10-25 DIAGNOSIS — R262 Difficulty in walking, not elsewhere classified: Secondary | ICD-10-CM | POA: Diagnosis not present

## 2020-10-25 DIAGNOSIS — B351 Tinea unguium: Secondary | ICD-10-CM | POA: Diagnosis not present

## 2020-10-25 DIAGNOSIS — R2689 Other abnormalities of gait and mobility: Secondary | ICD-10-CM | POA: Diagnosis not present

## 2020-10-25 DIAGNOSIS — R2681 Unsteadiness on feet: Secondary | ICD-10-CM | POA: Diagnosis not present

## 2020-10-25 DIAGNOSIS — R0602 Shortness of breath: Secondary | ICD-10-CM | POA: Diagnosis not present

## 2020-10-25 DIAGNOSIS — I5043 Acute on chronic combined systolic (congestive) and diastolic (congestive) heart failure: Secondary | ICD-10-CM | POA: Diagnosis not present

## 2020-10-25 DIAGNOSIS — H52203 Unspecified astigmatism, bilateral: Secondary | ICD-10-CM | POA: Diagnosis not present

## 2020-10-25 DIAGNOSIS — N183 Chronic kidney disease, stage 3 unspecified: Secondary | ICD-10-CM | POA: Diagnosis not present

## 2020-10-25 DIAGNOSIS — L853 Xerosis cutis: Secondary | ICD-10-CM | POA: Diagnosis not present

## 2020-10-25 DIAGNOSIS — N184 Chronic kidney disease, stage 4 (severe): Secondary | ICD-10-CM | POA: Diagnosis not present

## 2020-10-25 DIAGNOSIS — H2513 Age-related nuclear cataract, bilateral: Secondary | ICD-10-CM | POA: Diagnosis not present

## 2020-10-25 DIAGNOSIS — J9621 Acute and chronic respiratory failure with hypoxia: Secondary | ICD-10-CM | POA: Diagnosis not present

## 2020-10-25 DIAGNOSIS — M6281 Muscle weakness (generalized): Secondary | ICD-10-CM | POA: Diagnosis not present

## 2020-10-25 DIAGNOSIS — I639 Cerebral infarction, unspecified: Secondary | ICD-10-CM | POA: Diagnosis not present

## 2020-10-26 DIAGNOSIS — N184 Chronic kidney disease, stage 4 (severe): Secondary | ICD-10-CM | POA: Diagnosis not present

## 2020-10-26 DIAGNOSIS — I5043 Acute on chronic combined systolic (congestive) and diastolic (congestive) heart failure: Secondary | ICD-10-CM | POA: Diagnosis not present

## 2020-10-26 DIAGNOSIS — N183 Chronic kidney disease, stage 3 unspecified: Secondary | ICD-10-CM | POA: Diagnosis not present

## 2020-10-26 DIAGNOSIS — I639 Cerebral infarction, unspecified: Secondary | ICD-10-CM | POA: Diagnosis not present

## 2020-10-26 DIAGNOSIS — R0602 Shortness of breath: Secondary | ICD-10-CM | POA: Diagnosis not present

## 2020-10-26 DIAGNOSIS — J9621 Acute and chronic respiratory failure with hypoxia: Secondary | ICD-10-CM | POA: Diagnosis not present

## 2020-10-26 DIAGNOSIS — R2681 Unsteadiness on feet: Secondary | ICD-10-CM | POA: Diagnosis not present

## 2020-10-26 DIAGNOSIS — M6281 Muscle weakness (generalized): Secondary | ICD-10-CM | POA: Diagnosis not present

## 2020-10-26 DIAGNOSIS — R2689 Other abnormalities of gait and mobility: Secondary | ICD-10-CM | POA: Diagnosis not present

## 2020-10-27 DIAGNOSIS — I5043 Acute on chronic combined systolic (congestive) and diastolic (congestive) heart failure: Secondary | ICD-10-CM | POA: Diagnosis not present

## 2020-10-27 DIAGNOSIS — N183 Chronic kidney disease, stage 3 unspecified: Secondary | ICD-10-CM | POA: Diagnosis not present

## 2020-10-27 DIAGNOSIS — R0602 Shortness of breath: Secondary | ICD-10-CM | POA: Diagnosis not present

## 2020-10-27 DIAGNOSIS — R2689 Other abnormalities of gait and mobility: Secondary | ICD-10-CM | POA: Diagnosis not present

## 2020-10-27 DIAGNOSIS — N184 Chronic kidney disease, stage 4 (severe): Secondary | ICD-10-CM | POA: Diagnosis not present

## 2020-10-27 DIAGNOSIS — J9621 Acute and chronic respiratory failure with hypoxia: Secondary | ICD-10-CM | POA: Diagnosis not present

## 2020-10-27 DIAGNOSIS — R2681 Unsteadiness on feet: Secondary | ICD-10-CM | POA: Diagnosis not present

## 2020-10-27 DIAGNOSIS — M6281 Muscle weakness (generalized): Secondary | ICD-10-CM | POA: Diagnosis not present

## 2020-10-27 DIAGNOSIS — I639 Cerebral infarction, unspecified: Secondary | ICD-10-CM | POA: Diagnosis not present

## 2020-10-28 DIAGNOSIS — J9621 Acute and chronic respiratory failure with hypoxia: Secondary | ICD-10-CM | POA: Diagnosis not present

## 2020-10-28 DIAGNOSIS — N183 Chronic kidney disease, stage 3 unspecified: Secondary | ICD-10-CM | POA: Diagnosis not present

## 2020-10-28 DIAGNOSIS — R2689 Other abnormalities of gait and mobility: Secondary | ICD-10-CM | POA: Diagnosis not present

## 2020-10-28 DIAGNOSIS — I5043 Acute on chronic combined systolic (congestive) and diastolic (congestive) heart failure: Secondary | ICD-10-CM | POA: Diagnosis not present

## 2020-10-28 DIAGNOSIS — M6281 Muscle weakness (generalized): Secondary | ICD-10-CM | POA: Diagnosis not present

## 2020-10-28 DIAGNOSIS — R2681 Unsteadiness on feet: Secondary | ICD-10-CM | POA: Diagnosis not present

## 2020-10-28 DIAGNOSIS — R0602 Shortness of breath: Secondary | ICD-10-CM | POA: Diagnosis not present

## 2020-10-28 DIAGNOSIS — N184 Chronic kidney disease, stage 4 (severe): Secondary | ICD-10-CM | POA: Diagnosis not present

## 2020-10-28 DIAGNOSIS — I639 Cerebral infarction, unspecified: Secondary | ICD-10-CM | POA: Diagnosis not present

## 2020-10-31 DIAGNOSIS — M6281 Muscle weakness (generalized): Secondary | ICD-10-CM | POA: Diagnosis not present

## 2020-10-31 DIAGNOSIS — I5043 Acute on chronic combined systolic (congestive) and diastolic (congestive) heart failure: Secondary | ICD-10-CM | POA: Diagnosis not present

## 2020-10-31 DIAGNOSIS — R0602 Shortness of breath: Secondary | ICD-10-CM | POA: Diagnosis not present

## 2020-10-31 DIAGNOSIS — F330 Major depressive disorder, recurrent, mild: Secondary | ICD-10-CM | POA: Diagnosis not present

## 2020-10-31 DIAGNOSIS — N183 Chronic kidney disease, stage 3 unspecified: Secondary | ICD-10-CM | POA: Diagnosis not present

## 2020-10-31 DIAGNOSIS — I639 Cerebral infarction, unspecified: Secondary | ICD-10-CM | POA: Diagnosis not present

## 2020-10-31 DIAGNOSIS — R2689 Other abnormalities of gait and mobility: Secondary | ICD-10-CM | POA: Diagnosis not present

## 2020-10-31 DIAGNOSIS — N184 Chronic kidney disease, stage 4 (severe): Secondary | ICD-10-CM | POA: Diagnosis not present

## 2020-10-31 DIAGNOSIS — J9621 Acute and chronic respiratory failure with hypoxia: Secondary | ICD-10-CM | POA: Diagnosis not present

## 2020-10-31 DIAGNOSIS — R2681 Unsteadiness on feet: Secondary | ICD-10-CM | POA: Diagnosis not present

## 2020-10-31 DIAGNOSIS — F0390 Unspecified dementia without behavioral disturbance: Secondary | ICD-10-CM | POA: Diagnosis not present

## 2020-11-01 DIAGNOSIS — I639 Cerebral infarction, unspecified: Secondary | ICD-10-CM | POA: Diagnosis not present

## 2020-11-01 DIAGNOSIS — M6281 Muscle weakness (generalized): Secondary | ICD-10-CM | POA: Diagnosis not present

## 2020-11-01 DIAGNOSIS — I5043 Acute on chronic combined systolic (congestive) and diastolic (congestive) heart failure: Secondary | ICD-10-CM | POA: Diagnosis not present

## 2020-11-01 DIAGNOSIS — R0602 Shortness of breath: Secondary | ICD-10-CM | POA: Diagnosis not present

## 2020-11-01 DIAGNOSIS — R2681 Unsteadiness on feet: Secondary | ICD-10-CM | POA: Diagnosis not present

## 2020-11-01 DIAGNOSIS — N183 Chronic kidney disease, stage 3 unspecified: Secondary | ICD-10-CM | POA: Diagnosis not present

## 2020-11-01 DIAGNOSIS — J9621 Acute and chronic respiratory failure with hypoxia: Secondary | ICD-10-CM | POA: Diagnosis not present

## 2020-11-01 DIAGNOSIS — R2689 Other abnormalities of gait and mobility: Secondary | ICD-10-CM | POA: Diagnosis not present

## 2020-11-01 DIAGNOSIS — N184 Chronic kidney disease, stage 4 (severe): Secondary | ICD-10-CM | POA: Diagnosis not present

## 2020-11-02 DIAGNOSIS — I639 Cerebral infarction, unspecified: Secondary | ICD-10-CM | POA: Diagnosis not present

## 2020-11-02 DIAGNOSIS — R0602 Shortness of breath: Secondary | ICD-10-CM | POA: Diagnosis not present

## 2020-11-02 DIAGNOSIS — R2681 Unsteadiness on feet: Secondary | ICD-10-CM | POA: Diagnosis not present

## 2020-11-02 DIAGNOSIS — J9621 Acute and chronic respiratory failure with hypoxia: Secondary | ICD-10-CM | POA: Diagnosis not present

## 2020-11-02 DIAGNOSIS — I5043 Acute on chronic combined systolic (congestive) and diastolic (congestive) heart failure: Secondary | ICD-10-CM | POA: Diagnosis not present

## 2020-11-02 DIAGNOSIS — N183 Chronic kidney disease, stage 3 unspecified: Secondary | ICD-10-CM | POA: Diagnosis not present

## 2020-11-02 DIAGNOSIS — M6281 Muscle weakness (generalized): Secondary | ICD-10-CM | POA: Diagnosis not present

## 2020-11-02 DIAGNOSIS — N184 Chronic kidney disease, stage 4 (severe): Secondary | ICD-10-CM | POA: Diagnosis not present

## 2020-11-02 DIAGNOSIS — R2689 Other abnormalities of gait and mobility: Secondary | ICD-10-CM | POA: Diagnosis not present

## 2020-11-03 DIAGNOSIS — R0602 Shortness of breath: Secondary | ICD-10-CM | POA: Diagnosis not present

## 2020-11-03 DIAGNOSIS — R2681 Unsteadiness on feet: Secondary | ICD-10-CM | POA: Diagnosis not present

## 2020-11-03 DIAGNOSIS — N184 Chronic kidney disease, stage 4 (severe): Secondary | ICD-10-CM | POA: Diagnosis not present

## 2020-11-03 DIAGNOSIS — J9621 Acute and chronic respiratory failure with hypoxia: Secondary | ICD-10-CM | POA: Diagnosis not present

## 2020-11-03 DIAGNOSIS — M6281 Muscle weakness (generalized): Secondary | ICD-10-CM | POA: Diagnosis not present

## 2020-11-03 DIAGNOSIS — R2689 Other abnormalities of gait and mobility: Secondary | ICD-10-CM | POA: Diagnosis not present

## 2020-11-03 DIAGNOSIS — I5043 Acute on chronic combined systolic (congestive) and diastolic (congestive) heart failure: Secondary | ICD-10-CM | POA: Diagnosis not present

## 2020-11-03 DIAGNOSIS — I639 Cerebral infarction, unspecified: Secondary | ICD-10-CM | POA: Diagnosis not present

## 2020-11-03 DIAGNOSIS — N183 Chronic kidney disease, stage 3 unspecified: Secondary | ICD-10-CM | POA: Diagnosis not present

## 2020-11-04 DIAGNOSIS — R0602 Shortness of breath: Secondary | ICD-10-CM | POA: Diagnosis not present

## 2020-11-04 DIAGNOSIS — R2681 Unsteadiness on feet: Secondary | ICD-10-CM | POA: Diagnosis not present

## 2020-11-04 DIAGNOSIS — N183 Chronic kidney disease, stage 3 unspecified: Secondary | ICD-10-CM | POA: Diagnosis not present

## 2020-11-04 DIAGNOSIS — N184 Chronic kidney disease, stage 4 (severe): Secondary | ICD-10-CM | POA: Diagnosis not present

## 2020-11-04 DIAGNOSIS — I639 Cerebral infarction, unspecified: Secondary | ICD-10-CM | POA: Diagnosis not present

## 2020-11-04 DIAGNOSIS — J9621 Acute and chronic respiratory failure with hypoxia: Secondary | ICD-10-CM | POA: Diagnosis not present

## 2020-11-04 DIAGNOSIS — I5043 Acute on chronic combined systolic (congestive) and diastolic (congestive) heart failure: Secondary | ICD-10-CM | POA: Diagnosis not present

## 2020-11-04 DIAGNOSIS — M6281 Muscle weakness (generalized): Secondary | ICD-10-CM | POA: Diagnosis not present

## 2020-11-04 DIAGNOSIS — R2689 Other abnormalities of gait and mobility: Secondary | ICD-10-CM | POA: Diagnosis not present

## 2020-11-07 DIAGNOSIS — N183 Chronic kidney disease, stage 3 unspecified: Secondary | ICD-10-CM | POA: Diagnosis not present

## 2020-11-07 DIAGNOSIS — N184 Chronic kidney disease, stage 4 (severe): Secondary | ICD-10-CM | POA: Diagnosis not present

## 2020-11-07 DIAGNOSIS — R0602 Shortness of breath: Secondary | ICD-10-CM | POA: Diagnosis not present

## 2020-11-07 DIAGNOSIS — R2689 Other abnormalities of gait and mobility: Secondary | ICD-10-CM | POA: Diagnosis not present

## 2020-11-07 DIAGNOSIS — J9621 Acute and chronic respiratory failure with hypoxia: Secondary | ICD-10-CM | POA: Diagnosis not present

## 2020-11-07 DIAGNOSIS — M6281 Muscle weakness (generalized): Secondary | ICD-10-CM | POA: Diagnosis not present

## 2020-11-07 DIAGNOSIS — I639 Cerebral infarction, unspecified: Secondary | ICD-10-CM | POA: Diagnosis not present

## 2020-11-07 DIAGNOSIS — I5043 Acute on chronic combined systolic (congestive) and diastolic (congestive) heart failure: Secondary | ICD-10-CM | POA: Diagnosis not present

## 2020-11-07 DIAGNOSIS — R2681 Unsteadiness on feet: Secondary | ICD-10-CM | POA: Diagnosis not present

## 2020-11-08 DIAGNOSIS — R2681 Unsteadiness on feet: Secondary | ICD-10-CM | POA: Diagnosis not present

## 2020-11-08 DIAGNOSIS — I5043 Acute on chronic combined systolic (congestive) and diastolic (congestive) heart failure: Secondary | ICD-10-CM | POA: Diagnosis not present

## 2020-11-08 DIAGNOSIS — R2689 Other abnormalities of gait and mobility: Secondary | ICD-10-CM | POA: Diagnosis not present

## 2020-11-08 DIAGNOSIS — N184 Chronic kidney disease, stage 4 (severe): Secondary | ICD-10-CM | POA: Diagnosis not present

## 2020-11-08 DIAGNOSIS — N183 Chronic kidney disease, stage 3 unspecified: Secondary | ICD-10-CM | POA: Diagnosis not present

## 2020-11-08 DIAGNOSIS — M6281 Muscle weakness (generalized): Secondary | ICD-10-CM | POA: Diagnosis not present

## 2020-11-08 DIAGNOSIS — J9621 Acute and chronic respiratory failure with hypoxia: Secondary | ICD-10-CM | POA: Diagnosis not present

## 2020-11-08 DIAGNOSIS — I639 Cerebral infarction, unspecified: Secondary | ICD-10-CM | POA: Diagnosis not present

## 2020-11-08 DIAGNOSIS — I482 Chronic atrial fibrillation, unspecified: Secondary | ICD-10-CM | POA: Diagnosis not present

## 2020-11-08 DIAGNOSIS — R0602 Shortness of breath: Secondary | ICD-10-CM | POA: Diagnosis not present

## 2020-11-08 DIAGNOSIS — J449 Chronic obstructive pulmonary disease, unspecified: Secondary | ICD-10-CM | POA: Diagnosis not present

## 2020-11-08 DIAGNOSIS — I69354 Hemiplegia and hemiparesis following cerebral infarction affecting left non-dominant side: Secondary | ICD-10-CM | POA: Diagnosis not present

## 2020-11-09 DIAGNOSIS — J9621 Acute and chronic respiratory failure with hypoxia: Secondary | ICD-10-CM | POA: Diagnosis not present

## 2020-11-09 DIAGNOSIS — N183 Chronic kidney disease, stage 3 unspecified: Secondary | ICD-10-CM | POA: Diagnosis not present

## 2020-11-09 DIAGNOSIS — R0602 Shortness of breath: Secondary | ICD-10-CM | POA: Diagnosis not present

## 2020-11-09 DIAGNOSIS — N184 Chronic kidney disease, stage 4 (severe): Secondary | ICD-10-CM | POA: Diagnosis not present

## 2020-11-09 DIAGNOSIS — R2681 Unsteadiness on feet: Secondary | ICD-10-CM | POA: Diagnosis not present

## 2020-11-09 DIAGNOSIS — R2689 Other abnormalities of gait and mobility: Secondary | ICD-10-CM | POA: Diagnosis not present

## 2020-11-09 DIAGNOSIS — I5043 Acute on chronic combined systolic (congestive) and diastolic (congestive) heart failure: Secondary | ICD-10-CM | POA: Diagnosis not present

## 2020-11-09 DIAGNOSIS — I639 Cerebral infarction, unspecified: Secondary | ICD-10-CM | POA: Diagnosis not present

## 2020-11-09 DIAGNOSIS — M6281 Muscle weakness (generalized): Secondary | ICD-10-CM | POA: Diagnosis not present

## 2020-11-10 DIAGNOSIS — R2689 Other abnormalities of gait and mobility: Secondary | ICD-10-CM | POA: Diagnosis not present

## 2020-11-10 DIAGNOSIS — R0602 Shortness of breath: Secondary | ICD-10-CM | POA: Diagnosis not present

## 2020-11-10 DIAGNOSIS — N184 Chronic kidney disease, stage 4 (severe): Secondary | ICD-10-CM | POA: Diagnosis not present

## 2020-11-10 DIAGNOSIS — I639 Cerebral infarction, unspecified: Secondary | ICD-10-CM | POA: Diagnosis not present

## 2020-11-10 DIAGNOSIS — M6281 Muscle weakness (generalized): Secondary | ICD-10-CM | POA: Diagnosis not present

## 2020-11-10 DIAGNOSIS — N183 Chronic kidney disease, stage 3 unspecified: Secondary | ICD-10-CM | POA: Diagnosis not present

## 2020-11-10 DIAGNOSIS — I5043 Acute on chronic combined systolic (congestive) and diastolic (congestive) heart failure: Secondary | ICD-10-CM | POA: Diagnosis not present

## 2020-11-10 DIAGNOSIS — J9621 Acute and chronic respiratory failure with hypoxia: Secondary | ICD-10-CM | POA: Diagnosis not present

## 2020-11-10 DIAGNOSIS — R2681 Unsteadiness on feet: Secondary | ICD-10-CM | POA: Diagnosis not present

## 2020-11-11 DIAGNOSIS — I639 Cerebral infarction, unspecified: Secondary | ICD-10-CM | POA: Diagnosis not present

## 2020-11-11 DIAGNOSIS — N183 Chronic kidney disease, stage 3 unspecified: Secondary | ICD-10-CM | POA: Diagnosis not present

## 2020-11-11 DIAGNOSIS — R2689 Other abnormalities of gait and mobility: Secondary | ICD-10-CM | POA: Diagnosis not present

## 2020-11-11 DIAGNOSIS — M6281 Muscle weakness (generalized): Secondary | ICD-10-CM | POA: Diagnosis not present

## 2020-11-11 DIAGNOSIS — J9621 Acute and chronic respiratory failure with hypoxia: Secondary | ICD-10-CM | POA: Diagnosis not present

## 2020-11-11 DIAGNOSIS — R2681 Unsteadiness on feet: Secondary | ICD-10-CM | POA: Diagnosis not present

## 2020-11-11 DIAGNOSIS — R0602 Shortness of breath: Secondary | ICD-10-CM | POA: Diagnosis not present

## 2020-11-11 DIAGNOSIS — N184 Chronic kidney disease, stage 4 (severe): Secondary | ICD-10-CM | POA: Diagnosis not present

## 2020-11-11 DIAGNOSIS — I5043 Acute on chronic combined systolic (congestive) and diastolic (congestive) heart failure: Secondary | ICD-10-CM | POA: Diagnosis not present

## 2020-11-14 DIAGNOSIS — R0602 Shortness of breath: Secondary | ICD-10-CM | POA: Diagnosis not present

## 2020-11-14 DIAGNOSIS — M6281 Muscle weakness (generalized): Secondary | ICD-10-CM | POA: Diagnosis not present

## 2020-11-14 DIAGNOSIS — R2681 Unsteadiness on feet: Secondary | ICD-10-CM | POA: Diagnosis not present

## 2020-11-14 DIAGNOSIS — N184 Chronic kidney disease, stage 4 (severe): Secondary | ICD-10-CM | POA: Diagnosis not present

## 2020-11-14 DIAGNOSIS — N183 Chronic kidney disease, stage 3 unspecified: Secondary | ICD-10-CM | POA: Diagnosis not present

## 2020-11-14 DIAGNOSIS — I639 Cerebral infarction, unspecified: Secondary | ICD-10-CM | POA: Diagnosis not present

## 2020-11-14 DIAGNOSIS — R2689 Other abnormalities of gait and mobility: Secondary | ICD-10-CM | POA: Diagnosis not present

## 2020-11-14 DIAGNOSIS — J9621 Acute and chronic respiratory failure with hypoxia: Secondary | ICD-10-CM | POA: Diagnosis not present

## 2020-11-14 DIAGNOSIS — I5043 Acute on chronic combined systolic (congestive) and diastolic (congestive) heart failure: Secondary | ICD-10-CM | POA: Diagnosis not present

## 2020-11-15 DIAGNOSIS — N183 Chronic kidney disease, stage 3 unspecified: Secondary | ICD-10-CM | POA: Diagnosis not present

## 2020-11-15 DIAGNOSIS — I5043 Acute on chronic combined systolic (congestive) and diastolic (congestive) heart failure: Secondary | ICD-10-CM | POA: Diagnosis not present

## 2020-11-15 DIAGNOSIS — R2681 Unsteadiness on feet: Secondary | ICD-10-CM | POA: Diagnosis not present

## 2020-11-15 DIAGNOSIS — R2689 Other abnormalities of gait and mobility: Secondary | ICD-10-CM | POA: Diagnosis not present

## 2020-11-15 DIAGNOSIS — N184 Chronic kidney disease, stage 4 (severe): Secondary | ICD-10-CM | POA: Diagnosis not present

## 2020-11-15 DIAGNOSIS — I639 Cerebral infarction, unspecified: Secondary | ICD-10-CM | POA: Diagnosis not present

## 2020-11-15 DIAGNOSIS — J9621 Acute and chronic respiratory failure with hypoxia: Secondary | ICD-10-CM | POA: Diagnosis not present

## 2020-11-15 DIAGNOSIS — M6281 Muscle weakness (generalized): Secondary | ICD-10-CM | POA: Diagnosis not present

## 2020-11-15 DIAGNOSIS — R0602 Shortness of breath: Secondary | ICD-10-CM | POA: Diagnosis not present

## 2020-11-16 DIAGNOSIS — N183 Chronic kidney disease, stage 3 unspecified: Secondary | ICD-10-CM | POA: Diagnosis not present

## 2020-11-16 DIAGNOSIS — R2681 Unsteadiness on feet: Secondary | ICD-10-CM | POA: Diagnosis not present

## 2020-11-16 DIAGNOSIS — R2689 Other abnormalities of gait and mobility: Secondary | ICD-10-CM | POA: Diagnosis not present

## 2020-11-16 DIAGNOSIS — I5043 Acute on chronic combined systolic (congestive) and diastolic (congestive) heart failure: Secondary | ICD-10-CM | POA: Diagnosis not present

## 2020-11-16 DIAGNOSIS — J9621 Acute and chronic respiratory failure with hypoxia: Secondary | ICD-10-CM | POA: Diagnosis not present

## 2020-11-16 DIAGNOSIS — N184 Chronic kidney disease, stage 4 (severe): Secondary | ICD-10-CM | POA: Diagnosis not present

## 2020-11-16 DIAGNOSIS — I639 Cerebral infarction, unspecified: Secondary | ICD-10-CM | POA: Diagnosis not present

## 2020-11-16 DIAGNOSIS — M6281 Muscle weakness (generalized): Secondary | ICD-10-CM | POA: Diagnosis not present

## 2020-11-16 DIAGNOSIS — R0602 Shortness of breath: Secondary | ICD-10-CM | POA: Diagnosis not present

## 2020-11-17 DIAGNOSIS — N184 Chronic kidney disease, stage 4 (severe): Secondary | ICD-10-CM | POA: Diagnosis not present

## 2020-11-17 DIAGNOSIS — R2681 Unsteadiness on feet: Secondary | ICD-10-CM | POA: Diagnosis not present

## 2020-11-17 DIAGNOSIS — R2689 Other abnormalities of gait and mobility: Secondary | ICD-10-CM | POA: Diagnosis not present

## 2020-11-17 DIAGNOSIS — R0602 Shortness of breath: Secondary | ICD-10-CM | POA: Diagnosis not present

## 2020-11-17 DIAGNOSIS — N183 Chronic kidney disease, stage 3 unspecified: Secondary | ICD-10-CM | POA: Diagnosis not present

## 2020-11-17 DIAGNOSIS — I639 Cerebral infarction, unspecified: Secondary | ICD-10-CM | POA: Diagnosis not present

## 2020-11-17 DIAGNOSIS — M6281 Muscle weakness (generalized): Secondary | ICD-10-CM | POA: Diagnosis not present

## 2020-11-17 DIAGNOSIS — I5043 Acute on chronic combined systolic (congestive) and diastolic (congestive) heart failure: Secondary | ICD-10-CM | POA: Diagnosis not present

## 2020-11-17 DIAGNOSIS — J9621 Acute and chronic respiratory failure with hypoxia: Secondary | ICD-10-CM | POA: Diagnosis not present

## 2020-11-18 DIAGNOSIS — M6281 Muscle weakness (generalized): Secondary | ICD-10-CM | POA: Diagnosis not present

## 2020-11-18 DIAGNOSIS — N183 Chronic kidney disease, stage 3 unspecified: Secondary | ICD-10-CM | POA: Diagnosis not present

## 2020-11-18 DIAGNOSIS — R2681 Unsteadiness on feet: Secondary | ICD-10-CM | POA: Diagnosis not present

## 2020-11-18 DIAGNOSIS — I5043 Acute on chronic combined systolic (congestive) and diastolic (congestive) heart failure: Secondary | ICD-10-CM | POA: Diagnosis not present

## 2020-11-18 DIAGNOSIS — R0602 Shortness of breath: Secondary | ICD-10-CM | POA: Diagnosis not present

## 2020-11-18 DIAGNOSIS — R2689 Other abnormalities of gait and mobility: Secondary | ICD-10-CM | POA: Diagnosis not present

## 2020-11-18 DIAGNOSIS — N184 Chronic kidney disease, stage 4 (severe): Secondary | ICD-10-CM | POA: Diagnosis not present

## 2020-11-18 DIAGNOSIS — J9621 Acute and chronic respiratory failure with hypoxia: Secondary | ICD-10-CM | POA: Diagnosis not present

## 2020-11-18 DIAGNOSIS — I639 Cerebral infarction, unspecified: Secondary | ICD-10-CM | POA: Diagnosis not present

## 2020-11-21 DIAGNOSIS — R2681 Unsteadiness on feet: Secondary | ICD-10-CM | POA: Diagnosis not present

## 2020-11-21 DIAGNOSIS — M6281 Muscle weakness (generalized): Secondary | ICD-10-CM | POA: Diagnosis not present

## 2020-11-21 DIAGNOSIS — N183 Chronic kidney disease, stage 3 unspecified: Secondary | ICD-10-CM | POA: Diagnosis not present

## 2020-11-21 DIAGNOSIS — R0602 Shortness of breath: Secondary | ICD-10-CM | POA: Diagnosis not present

## 2020-11-21 DIAGNOSIS — R2689 Other abnormalities of gait and mobility: Secondary | ICD-10-CM | POA: Diagnosis not present

## 2020-11-21 DIAGNOSIS — I639 Cerebral infarction, unspecified: Secondary | ICD-10-CM | POA: Diagnosis not present

## 2020-11-21 DIAGNOSIS — N184 Chronic kidney disease, stage 4 (severe): Secondary | ICD-10-CM | POA: Diagnosis not present

## 2020-11-21 DIAGNOSIS — J9621 Acute and chronic respiratory failure with hypoxia: Secondary | ICD-10-CM | POA: Diagnosis not present

## 2020-11-21 DIAGNOSIS — I5043 Acute on chronic combined systolic (congestive) and diastolic (congestive) heart failure: Secondary | ICD-10-CM | POA: Diagnosis not present

## 2020-11-22 DIAGNOSIS — R0602 Shortness of breath: Secondary | ICD-10-CM | POA: Diagnosis not present

## 2020-11-22 DIAGNOSIS — R2689 Other abnormalities of gait and mobility: Secondary | ICD-10-CM | POA: Diagnosis not present

## 2020-11-22 DIAGNOSIS — N183 Chronic kidney disease, stage 3 unspecified: Secondary | ICD-10-CM | POA: Diagnosis not present

## 2020-11-22 DIAGNOSIS — M6281 Muscle weakness (generalized): Secondary | ICD-10-CM | POA: Diagnosis not present

## 2020-11-22 DIAGNOSIS — I639 Cerebral infarction, unspecified: Secondary | ICD-10-CM | POA: Diagnosis not present

## 2020-11-22 DIAGNOSIS — R2681 Unsteadiness on feet: Secondary | ICD-10-CM | POA: Diagnosis not present

## 2020-11-22 DIAGNOSIS — I5043 Acute on chronic combined systolic (congestive) and diastolic (congestive) heart failure: Secondary | ICD-10-CM | POA: Diagnosis not present

## 2020-11-22 DIAGNOSIS — J9621 Acute and chronic respiratory failure with hypoxia: Secondary | ICD-10-CM | POA: Diagnosis not present

## 2020-11-22 DIAGNOSIS — N184 Chronic kidney disease, stage 4 (severe): Secondary | ICD-10-CM | POA: Diagnosis not present

## 2020-11-23 DIAGNOSIS — J9621 Acute and chronic respiratory failure with hypoxia: Secondary | ICD-10-CM | POA: Diagnosis not present

## 2020-11-23 DIAGNOSIS — R2681 Unsteadiness on feet: Secondary | ICD-10-CM | POA: Diagnosis not present

## 2020-11-23 DIAGNOSIS — R2689 Other abnormalities of gait and mobility: Secondary | ICD-10-CM | POA: Diagnosis not present

## 2020-11-23 DIAGNOSIS — N183 Chronic kidney disease, stage 3 unspecified: Secondary | ICD-10-CM | POA: Diagnosis not present

## 2020-11-23 DIAGNOSIS — I5043 Acute on chronic combined systolic (congestive) and diastolic (congestive) heart failure: Secondary | ICD-10-CM | POA: Diagnosis not present

## 2020-11-23 DIAGNOSIS — I639 Cerebral infarction, unspecified: Secondary | ICD-10-CM | POA: Diagnosis not present

## 2020-11-23 DIAGNOSIS — R0602 Shortness of breath: Secondary | ICD-10-CM | POA: Diagnosis not present

## 2020-11-23 DIAGNOSIS — N184 Chronic kidney disease, stage 4 (severe): Secondary | ICD-10-CM | POA: Diagnosis not present

## 2020-11-23 DIAGNOSIS — M6281 Muscle weakness (generalized): Secondary | ICD-10-CM | POA: Diagnosis not present

## 2020-11-24 DIAGNOSIS — M6281 Muscle weakness (generalized): Secondary | ICD-10-CM | POA: Diagnosis not present

## 2020-11-24 DIAGNOSIS — D509 Iron deficiency anemia, unspecified: Secondary | ICD-10-CM | POA: Diagnosis not present

## 2020-11-24 DIAGNOSIS — I5043 Acute on chronic combined systolic (congestive) and diastolic (congestive) heart failure: Secondary | ICD-10-CM | POA: Diagnosis not present

## 2020-11-24 DIAGNOSIS — R2689 Other abnormalities of gait and mobility: Secondary | ICD-10-CM | POA: Diagnosis not present

## 2020-11-24 DIAGNOSIS — I639 Cerebral infarction, unspecified: Secondary | ICD-10-CM | POA: Diagnosis not present

## 2020-11-24 DIAGNOSIS — N183 Chronic kidney disease, stage 3 unspecified: Secondary | ICD-10-CM | POA: Diagnosis not present

## 2020-11-24 DIAGNOSIS — R634 Abnormal weight loss: Secondary | ICD-10-CM | POA: Diagnosis not present

## 2020-11-24 DIAGNOSIS — Z7901 Long term (current) use of anticoagulants: Secondary | ICD-10-CM | POA: Diagnosis not present

## 2020-11-24 DIAGNOSIS — R2681 Unsteadiness on feet: Secondary | ICD-10-CM | POA: Diagnosis not present

## 2020-11-24 DIAGNOSIS — Z8673 Personal history of transient ischemic attack (TIA), and cerebral infarction without residual deficits: Secondary | ICD-10-CM | POA: Diagnosis not present

## 2020-11-24 DIAGNOSIS — R0602 Shortness of breath: Secondary | ICD-10-CM | POA: Diagnosis not present

## 2020-11-24 DIAGNOSIS — J9621 Acute and chronic respiratory failure with hypoxia: Secondary | ICD-10-CM | POA: Diagnosis not present

## 2020-11-24 DIAGNOSIS — N184 Chronic kidney disease, stage 4 (severe): Secondary | ICD-10-CM | POA: Diagnosis not present

## 2020-11-25 DIAGNOSIS — R2689 Other abnormalities of gait and mobility: Secondary | ICD-10-CM | POA: Diagnosis not present

## 2020-11-25 DIAGNOSIS — N183 Chronic kidney disease, stage 3 unspecified: Secondary | ICD-10-CM | POA: Diagnosis not present

## 2020-11-25 DIAGNOSIS — M6281 Muscle weakness (generalized): Secondary | ICD-10-CM | POA: Diagnosis not present

## 2020-11-25 DIAGNOSIS — R0602 Shortness of breath: Secondary | ICD-10-CM | POA: Diagnosis not present

## 2020-11-25 DIAGNOSIS — N184 Chronic kidney disease, stage 4 (severe): Secondary | ICD-10-CM | POA: Diagnosis not present

## 2020-11-25 DIAGNOSIS — I5043 Acute on chronic combined systolic (congestive) and diastolic (congestive) heart failure: Secondary | ICD-10-CM | POA: Diagnosis not present

## 2020-11-25 DIAGNOSIS — R2681 Unsteadiness on feet: Secondary | ICD-10-CM | POA: Diagnosis not present

## 2020-11-25 DIAGNOSIS — I639 Cerebral infarction, unspecified: Secondary | ICD-10-CM | POA: Diagnosis not present

## 2020-11-25 DIAGNOSIS — J9621 Acute and chronic respiratory failure with hypoxia: Secondary | ICD-10-CM | POA: Diagnosis not present

## 2020-11-28 DIAGNOSIS — F330 Major depressive disorder, recurrent, mild: Secondary | ICD-10-CM | POA: Diagnosis not present

## 2020-11-28 DIAGNOSIS — R2689 Other abnormalities of gait and mobility: Secondary | ICD-10-CM | POA: Diagnosis not present

## 2020-11-28 DIAGNOSIS — N184 Chronic kidney disease, stage 4 (severe): Secondary | ICD-10-CM | POA: Diagnosis not present

## 2020-11-28 DIAGNOSIS — I639 Cerebral infarction, unspecified: Secondary | ICD-10-CM | POA: Diagnosis not present

## 2020-11-28 DIAGNOSIS — J9621 Acute and chronic respiratory failure with hypoxia: Secondary | ICD-10-CM | POA: Diagnosis not present

## 2020-11-28 DIAGNOSIS — R2681 Unsteadiness on feet: Secondary | ICD-10-CM | POA: Diagnosis not present

## 2020-11-28 DIAGNOSIS — M6281 Muscle weakness (generalized): Secondary | ICD-10-CM | POA: Diagnosis not present

## 2020-11-28 DIAGNOSIS — R0602 Shortness of breath: Secondary | ICD-10-CM | POA: Diagnosis not present

## 2020-11-28 DIAGNOSIS — F0390 Unspecified dementia without behavioral disturbance: Secondary | ICD-10-CM | POA: Diagnosis not present

## 2020-11-28 DIAGNOSIS — I5043 Acute on chronic combined systolic (congestive) and diastolic (congestive) heart failure: Secondary | ICD-10-CM | POA: Diagnosis not present

## 2020-11-28 DIAGNOSIS — N183 Chronic kidney disease, stage 3 unspecified: Secondary | ICD-10-CM | POA: Diagnosis not present

## 2020-11-29 DIAGNOSIS — N184 Chronic kidney disease, stage 4 (severe): Secondary | ICD-10-CM | POA: Diagnosis not present

## 2020-11-29 DIAGNOSIS — I639 Cerebral infarction, unspecified: Secondary | ICD-10-CM | POA: Diagnosis not present

## 2020-11-29 DIAGNOSIS — I5043 Acute on chronic combined systolic (congestive) and diastolic (congestive) heart failure: Secondary | ICD-10-CM | POA: Diagnosis not present

## 2020-11-29 DIAGNOSIS — R0602 Shortness of breath: Secondary | ICD-10-CM | POA: Diagnosis not present

## 2020-11-29 DIAGNOSIS — J9621 Acute and chronic respiratory failure with hypoxia: Secondary | ICD-10-CM | POA: Diagnosis not present

## 2020-11-29 DIAGNOSIS — M6281 Muscle weakness (generalized): Secondary | ICD-10-CM | POA: Diagnosis not present

## 2020-11-29 DIAGNOSIS — R2681 Unsteadiness on feet: Secondary | ICD-10-CM | POA: Diagnosis not present

## 2020-11-29 DIAGNOSIS — R2689 Other abnormalities of gait and mobility: Secondary | ICD-10-CM | POA: Diagnosis not present

## 2020-11-29 DIAGNOSIS — N183 Chronic kidney disease, stage 3 unspecified: Secondary | ICD-10-CM | POA: Diagnosis not present

## 2020-11-30 DIAGNOSIS — R0602 Shortness of breath: Secondary | ICD-10-CM | POA: Diagnosis not present

## 2020-11-30 DIAGNOSIS — R2681 Unsteadiness on feet: Secondary | ICD-10-CM | POA: Diagnosis not present

## 2020-11-30 DIAGNOSIS — N184 Chronic kidney disease, stage 4 (severe): Secondary | ICD-10-CM | POA: Diagnosis not present

## 2020-11-30 DIAGNOSIS — I639 Cerebral infarction, unspecified: Secondary | ICD-10-CM | POA: Diagnosis not present

## 2020-11-30 DIAGNOSIS — R2689 Other abnormalities of gait and mobility: Secondary | ICD-10-CM | POA: Diagnosis not present

## 2020-11-30 DIAGNOSIS — N183 Chronic kidney disease, stage 3 unspecified: Secondary | ICD-10-CM | POA: Diagnosis not present

## 2020-11-30 DIAGNOSIS — M6281 Muscle weakness (generalized): Secondary | ICD-10-CM | POA: Diagnosis not present

## 2020-11-30 DIAGNOSIS — I5043 Acute on chronic combined systolic (congestive) and diastolic (congestive) heart failure: Secondary | ICD-10-CM | POA: Diagnosis not present

## 2020-11-30 DIAGNOSIS — J9621 Acute and chronic respiratory failure with hypoxia: Secondary | ICD-10-CM | POA: Diagnosis not present

## 2020-12-01 DIAGNOSIS — J9621 Acute and chronic respiratory failure with hypoxia: Secondary | ICD-10-CM | POA: Diagnosis not present

## 2020-12-01 DIAGNOSIS — R0602 Shortness of breath: Secondary | ICD-10-CM | POA: Diagnosis not present

## 2020-12-01 DIAGNOSIS — N183 Chronic kidney disease, stage 3 unspecified: Secondary | ICD-10-CM | POA: Diagnosis not present

## 2020-12-01 DIAGNOSIS — M6281 Muscle weakness (generalized): Secondary | ICD-10-CM | POA: Diagnosis not present

## 2020-12-01 DIAGNOSIS — N184 Chronic kidney disease, stage 4 (severe): Secondary | ICD-10-CM | POA: Diagnosis not present

## 2020-12-01 DIAGNOSIS — I5043 Acute on chronic combined systolic (congestive) and diastolic (congestive) heart failure: Secondary | ICD-10-CM | POA: Diagnosis not present

## 2020-12-01 DIAGNOSIS — R2689 Other abnormalities of gait and mobility: Secondary | ICD-10-CM | POA: Diagnosis not present

## 2020-12-01 DIAGNOSIS — I639 Cerebral infarction, unspecified: Secondary | ICD-10-CM | POA: Diagnosis not present

## 2020-12-01 DIAGNOSIS — R2681 Unsteadiness on feet: Secondary | ICD-10-CM | POA: Diagnosis not present

## 2020-12-02 DIAGNOSIS — R2689 Other abnormalities of gait and mobility: Secondary | ICD-10-CM | POA: Diagnosis not present

## 2020-12-02 DIAGNOSIS — J9621 Acute and chronic respiratory failure with hypoxia: Secondary | ICD-10-CM | POA: Diagnosis not present

## 2020-12-02 DIAGNOSIS — R0602 Shortness of breath: Secondary | ICD-10-CM | POA: Diagnosis not present

## 2020-12-02 DIAGNOSIS — R2681 Unsteadiness on feet: Secondary | ICD-10-CM | POA: Diagnosis not present

## 2020-12-02 DIAGNOSIS — N183 Chronic kidney disease, stage 3 unspecified: Secondary | ICD-10-CM | POA: Diagnosis not present

## 2020-12-02 DIAGNOSIS — N184 Chronic kidney disease, stage 4 (severe): Secondary | ICD-10-CM | POA: Diagnosis not present

## 2020-12-02 DIAGNOSIS — M6281 Muscle weakness (generalized): Secondary | ICD-10-CM | POA: Diagnosis not present

## 2020-12-02 DIAGNOSIS — I639 Cerebral infarction, unspecified: Secondary | ICD-10-CM | POA: Diagnosis not present

## 2020-12-02 DIAGNOSIS — I5043 Acute on chronic combined systolic (congestive) and diastolic (congestive) heart failure: Secondary | ICD-10-CM | POA: Diagnosis not present

## 2020-12-05 DIAGNOSIS — R0602 Shortness of breath: Secondary | ICD-10-CM | POA: Diagnosis not present

## 2020-12-05 DIAGNOSIS — M6281 Muscle weakness (generalized): Secondary | ICD-10-CM | POA: Diagnosis not present

## 2020-12-05 DIAGNOSIS — N184 Chronic kidney disease, stage 4 (severe): Secondary | ICD-10-CM | POA: Diagnosis not present

## 2020-12-05 DIAGNOSIS — I5043 Acute on chronic combined systolic (congestive) and diastolic (congestive) heart failure: Secondary | ICD-10-CM | POA: Diagnosis not present

## 2020-12-05 DIAGNOSIS — N183 Chronic kidney disease, stage 3 unspecified: Secondary | ICD-10-CM | POA: Diagnosis not present

## 2020-12-05 DIAGNOSIS — I639 Cerebral infarction, unspecified: Secondary | ICD-10-CM | POA: Diagnosis not present

## 2020-12-05 DIAGNOSIS — R2689 Other abnormalities of gait and mobility: Secondary | ICD-10-CM | POA: Diagnosis not present

## 2020-12-05 DIAGNOSIS — R2681 Unsteadiness on feet: Secondary | ICD-10-CM | POA: Diagnosis not present

## 2020-12-05 DIAGNOSIS — J9621 Acute and chronic respiratory failure with hypoxia: Secondary | ICD-10-CM | POA: Diagnosis not present

## 2020-12-06 DIAGNOSIS — I5043 Acute on chronic combined systolic (congestive) and diastolic (congestive) heart failure: Secondary | ICD-10-CM | POA: Diagnosis not present

## 2020-12-06 DIAGNOSIS — R2681 Unsteadiness on feet: Secondary | ICD-10-CM | POA: Diagnosis not present

## 2020-12-06 DIAGNOSIS — J9621 Acute and chronic respiratory failure with hypoxia: Secondary | ICD-10-CM | POA: Diagnosis not present

## 2020-12-06 DIAGNOSIS — R2689 Other abnormalities of gait and mobility: Secondary | ICD-10-CM | POA: Diagnosis not present

## 2020-12-06 DIAGNOSIS — R0602 Shortness of breath: Secondary | ICD-10-CM | POA: Diagnosis not present

## 2020-12-06 DIAGNOSIS — I639 Cerebral infarction, unspecified: Secondary | ICD-10-CM | POA: Diagnosis not present

## 2020-12-06 DIAGNOSIS — N183 Chronic kidney disease, stage 3 unspecified: Secondary | ICD-10-CM | POA: Diagnosis not present

## 2020-12-06 DIAGNOSIS — M6281 Muscle weakness (generalized): Secondary | ICD-10-CM | POA: Diagnosis not present

## 2020-12-06 DIAGNOSIS — N184 Chronic kidney disease, stage 4 (severe): Secondary | ICD-10-CM | POA: Diagnosis not present

## 2020-12-07 DIAGNOSIS — I5043 Acute on chronic combined systolic (congestive) and diastolic (congestive) heart failure: Secondary | ICD-10-CM | POA: Diagnosis not present

## 2020-12-07 DIAGNOSIS — N183 Chronic kidney disease, stage 3 unspecified: Secondary | ICD-10-CM | POA: Diagnosis not present

## 2020-12-07 DIAGNOSIS — N184 Chronic kidney disease, stage 4 (severe): Secondary | ICD-10-CM | POA: Diagnosis not present

## 2020-12-07 DIAGNOSIS — M6281 Muscle weakness (generalized): Secondary | ICD-10-CM | POA: Diagnosis not present

## 2020-12-07 DIAGNOSIS — J9621 Acute and chronic respiratory failure with hypoxia: Secondary | ICD-10-CM | POA: Diagnosis not present

## 2020-12-07 DIAGNOSIS — R0602 Shortness of breath: Secondary | ICD-10-CM | POA: Diagnosis not present

## 2020-12-07 DIAGNOSIS — I130 Hypertensive heart and chronic kidney disease with heart failure and stage 1 through stage 4 chronic kidney disease, or unspecified chronic kidney disease: Secondary | ICD-10-CM | POA: Diagnosis not present

## 2020-12-07 DIAGNOSIS — R2689 Other abnormalities of gait and mobility: Secondary | ICD-10-CM | POA: Diagnosis not present

## 2020-12-07 DIAGNOSIS — I639 Cerebral infarction, unspecified: Secondary | ICD-10-CM | POA: Diagnosis not present

## 2020-12-07 DIAGNOSIS — J449 Chronic obstructive pulmonary disease, unspecified: Secondary | ICD-10-CM | POA: Diagnosis not present

## 2020-12-07 DIAGNOSIS — I482 Chronic atrial fibrillation, unspecified: Secondary | ICD-10-CM | POA: Diagnosis not present

## 2020-12-07 DIAGNOSIS — R2681 Unsteadiness on feet: Secondary | ICD-10-CM | POA: Diagnosis not present

## 2020-12-08 DIAGNOSIS — N184 Chronic kidney disease, stage 4 (severe): Secondary | ICD-10-CM | POA: Diagnosis not present

## 2020-12-08 DIAGNOSIS — N183 Chronic kidney disease, stage 3 unspecified: Secondary | ICD-10-CM | POA: Diagnosis not present

## 2020-12-08 DIAGNOSIS — J9621 Acute and chronic respiratory failure with hypoxia: Secondary | ICD-10-CM | POA: Diagnosis not present

## 2020-12-08 DIAGNOSIS — I639 Cerebral infarction, unspecified: Secondary | ICD-10-CM | POA: Diagnosis not present

## 2020-12-08 DIAGNOSIS — M6281 Muscle weakness (generalized): Secondary | ICD-10-CM | POA: Diagnosis not present

## 2020-12-08 DIAGNOSIS — R0602 Shortness of breath: Secondary | ICD-10-CM | POA: Diagnosis not present

## 2020-12-08 DIAGNOSIS — I5043 Acute on chronic combined systolic (congestive) and diastolic (congestive) heart failure: Secondary | ICD-10-CM | POA: Diagnosis not present

## 2020-12-08 DIAGNOSIS — R2681 Unsteadiness on feet: Secondary | ICD-10-CM | POA: Diagnosis not present

## 2020-12-08 DIAGNOSIS — R2689 Other abnormalities of gait and mobility: Secondary | ICD-10-CM | POA: Diagnosis not present

## 2020-12-09 DIAGNOSIS — M6281 Muscle weakness (generalized): Secondary | ICD-10-CM | POA: Diagnosis not present

## 2020-12-09 DIAGNOSIS — N184 Chronic kidney disease, stage 4 (severe): Secondary | ICD-10-CM | POA: Diagnosis not present

## 2020-12-09 DIAGNOSIS — F330 Major depressive disorder, recurrent, mild: Secondary | ICD-10-CM | POA: Diagnosis not present

## 2020-12-09 DIAGNOSIS — I5043 Acute on chronic combined systolic (congestive) and diastolic (congestive) heart failure: Secondary | ICD-10-CM | POA: Diagnosis not present

## 2020-12-09 DIAGNOSIS — I639 Cerebral infarction, unspecified: Secondary | ICD-10-CM | POA: Diagnosis not present

## 2020-12-09 DIAGNOSIS — R2681 Unsteadiness on feet: Secondary | ICD-10-CM | POA: Diagnosis not present

## 2020-12-09 DIAGNOSIS — R2689 Other abnormalities of gait and mobility: Secondary | ICD-10-CM | POA: Diagnosis not present

## 2020-12-09 DIAGNOSIS — F0390 Unspecified dementia without behavioral disturbance: Secondary | ICD-10-CM | POA: Diagnosis not present

## 2020-12-09 DIAGNOSIS — N183 Chronic kidney disease, stage 3 unspecified: Secondary | ICD-10-CM | POA: Diagnosis not present

## 2020-12-09 DIAGNOSIS — J9621 Acute and chronic respiratory failure with hypoxia: Secondary | ICD-10-CM | POA: Diagnosis not present

## 2020-12-09 DIAGNOSIS — R0602 Shortness of breath: Secondary | ICD-10-CM | POA: Diagnosis not present

## 2020-12-12 DIAGNOSIS — I639 Cerebral infarction, unspecified: Secondary | ICD-10-CM | POA: Diagnosis not present

## 2020-12-12 DIAGNOSIS — N183 Chronic kidney disease, stage 3 unspecified: Secondary | ICD-10-CM | POA: Diagnosis not present

## 2020-12-12 DIAGNOSIS — R2681 Unsteadiness on feet: Secondary | ICD-10-CM | POA: Diagnosis not present

## 2020-12-12 DIAGNOSIS — M6281 Muscle weakness (generalized): Secondary | ICD-10-CM | POA: Diagnosis not present

## 2020-12-12 DIAGNOSIS — I5043 Acute on chronic combined systolic (congestive) and diastolic (congestive) heart failure: Secondary | ICD-10-CM | POA: Diagnosis not present

## 2020-12-12 DIAGNOSIS — F0390 Unspecified dementia without behavioral disturbance: Secondary | ICD-10-CM | POA: Diagnosis not present

## 2020-12-12 DIAGNOSIS — N184 Chronic kidney disease, stage 4 (severe): Secondary | ICD-10-CM | POA: Diagnosis not present

## 2020-12-12 DIAGNOSIS — R0602 Shortness of breath: Secondary | ICD-10-CM | POA: Diagnosis not present

## 2020-12-12 DIAGNOSIS — F330 Major depressive disorder, recurrent, mild: Secondary | ICD-10-CM | POA: Diagnosis not present

## 2020-12-12 DIAGNOSIS — J9621 Acute and chronic respiratory failure with hypoxia: Secondary | ICD-10-CM | POA: Diagnosis not present

## 2020-12-12 DIAGNOSIS — R2689 Other abnormalities of gait and mobility: Secondary | ICD-10-CM | POA: Diagnosis not present

## 2020-12-13 DIAGNOSIS — J9621 Acute and chronic respiratory failure with hypoxia: Secondary | ICD-10-CM | POA: Diagnosis not present

## 2020-12-13 DIAGNOSIS — R0602 Shortness of breath: Secondary | ICD-10-CM | POA: Diagnosis not present

## 2020-12-13 DIAGNOSIS — I639 Cerebral infarction, unspecified: Secondary | ICD-10-CM | POA: Diagnosis not present

## 2020-12-13 DIAGNOSIS — N184 Chronic kidney disease, stage 4 (severe): Secondary | ICD-10-CM | POA: Diagnosis not present

## 2020-12-13 DIAGNOSIS — I5043 Acute on chronic combined systolic (congestive) and diastolic (congestive) heart failure: Secondary | ICD-10-CM | POA: Diagnosis not present

## 2020-12-13 DIAGNOSIS — R2689 Other abnormalities of gait and mobility: Secondary | ICD-10-CM | POA: Diagnosis not present

## 2020-12-13 DIAGNOSIS — M6281 Muscle weakness (generalized): Secondary | ICD-10-CM | POA: Diagnosis not present

## 2020-12-13 DIAGNOSIS — R2681 Unsteadiness on feet: Secondary | ICD-10-CM | POA: Diagnosis not present

## 2020-12-13 DIAGNOSIS — N183 Chronic kidney disease, stage 3 unspecified: Secondary | ICD-10-CM | POA: Diagnosis not present

## 2020-12-14 DIAGNOSIS — R2681 Unsteadiness on feet: Secondary | ICD-10-CM | POA: Diagnosis not present

## 2020-12-14 DIAGNOSIS — M6281 Muscle weakness (generalized): Secondary | ICD-10-CM | POA: Diagnosis not present

## 2020-12-14 DIAGNOSIS — I5043 Acute on chronic combined systolic (congestive) and diastolic (congestive) heart failure: Secondary | ICD-10-CM | POA: Diagnosis not present

## 2020-12-14 DIAGNOSIS — J9621 Acute and chronic respiratory failure with hypoxia: Secondary | ICD-10-CM | POA: Diagnosis not present

## 2020-12-14 DIAGNOSIS — I639 Cerebral infarction, unspecified: Secondary | ICD-10-CM | POA: Diagnosis not present

## 2020-12-14 DIAGNOSIS — N183 Chronic kidney disease, stage 3 unspecified: Secondary | ICD-10-CM | POA: Diagnosis not present

## 2020-12-14 DIAGNOSIS — N184 Chronic kidney disease, stage 4 (severe): Secondary | ICD-10-CM | POA: Diagnosis not present

## 2020-12-14 DIAGNOSIS — R2689 Other abnormalities of gait and mobility: Secondary | ICD-10-CM | POA: Diagnosis not present

## 2020-12-14 DIAGNOSIS — R0602 Shortness of breath: Secondary | ICD-10-CM | POA: Diagnosis not present

## 2020-12-15 DIAGNOSIS — N184 Chronic kidney disease, stage 4 (severe): Secondary | ICD-10-CM | POA: Diagnosis not present

## 2020-12-15 DIAGNOSIS — R2689 Other abnormalities of gait and mobility: Secondary | ICD-10-CM | POA: Diagnosis not present

## 2020-12-15 DIAGNOSIS — I5043 Acute on chronic combined systolic (congestive) and diastolic (congestive) heart failure: Secondary | ICD-10-CM | POA: Diagnosis not present

## 2020-12-15 DIAGNOSIS — I639 Cerebral infarction, unspecified: Secondary | ICD-10-CM | POA: Diagnosis not present

## 2020-12-15 DIAGNOSIS — R0602 Shortness of breath: Secondary | ICD-10-CM | POA: Diagnosis not present

## 2020-12-15 DIAGNOSIS — R2681 Unsteadiness on feet: Secondary | ICD-10-CM | POA: Diagnosis not present

## 2020-12-15 DIAGNOSIS — J9621 Acute and chronic respiratory failure with hypoxia: Secondary | ICD-10-CM | POA: Diagnosis not present

## 2020-12-15 DIAGNOSIS — N183 Chronic kidney disease, stage 3 unspecified: Secondary | ICD-10-CM | POA: Diagnosis not present

## 2020-12-15 DIAGNOSIS — M6281 Muscle weakness (generalized): Secondary | ICD-10-CM | POA: Diagnosis not present

## 2020-12-16 DIAGNOSIS — M6281 Muscle weakness (generalized): Secondary | ICD-10-CM | POA: Diagnosis not present

## 2020-12-16 DIAGNOSIS — N184 Chronic kidney disease, stage 4 (severe): Secondary | ICD-10-CM | POA: Diagnosis not present

## 2020-12-16 DIAGNOSIS — R0602 Shortness of breath: Secondary | ICD-10-CM | POA: Diagnosis not present

## 2020-12-16 DIAGNOSIS — I639 Cerebral infarction, unspecified: Secondary | ICD-10-CM | POA: Diagnosis not present

## 2020-12-16 DIAGNOSIS — N183 Chronic kidney disease, stage 3 unspecified: Secondary | ICD-10-CM | POA: Diagnosis not present

## 2020-12-16 DIAGNOSIS — R2681 Unsteadiness on feet: Secondary | ICD-10-CM | POA: Diagnosis not present

## 2020-12-16 DIAGNOSIS — I5043 Acute on chronic combined systolic (congestive) and diastolic (congestive) heart failure: Secondary | ICD-10-CM | POA: Diagnosis not present

## 2020-12-16 DIAGNOSIS — R2689 Other abnormalities of gait and mobility: Secondary | ICD-10-CM | POA: Diagnosis not present

## 2020-12-16 DIAGNOSIS — J9621 Acute and chronic respiratory failure with hypoxia: Secondary | ICD-10-CM | POA: Diagnosis not present

## 2020-12-19 DIAGNOSIS — I5043 Acute on chronic combined systolic (congestive) and diastolic (congestive) heart failure: Secondary | ICD-10-CM | POA: Diagnosis not present

## 2020-12-19 DIAGNOSIS — M6281 Muscle weakness (generalized): Secondary | ICD-10-CM | POA: Diagnosis not present

## 2020-12-19 DIAGNOSIS — R2689 Other abnormalities of gait and mobility: Secondary | ICD-10-CM | POA: Diagnosis not present

## 2020-12-19 DIAGNOSIS — I639 Cerebral infarction, unspecified: Secondary | ICD-10-CM | POA: Diagnosis not present

## 2020-12-19 DIAGNOSIS — J9621 Acute and chronic respiratory failure with hypoxia: Secondary | ICD-10-CM | POA: Diagnosis not present

## 2020-12-19 DIAGNOSIS — R2681 Unsteadiness on feet: Secondary | ICD-10-CM | POA: Diagnosis not present

## 2020-12-19 DIAGNOSIS — N183 Chronic kidney disease, stage 3 unspecified: Secondary | ICD-10-CM | POA: Diagnosis not present

## 2020-12-19 DIAGNOSIS — N184 Chronic kidney disease, stage 4 (severe): Secondary | ICD-10-CM | POA: Diagnosis not present

## 2020-12-19 DIAGNOSIS — R0602 Shortness of breath: Secondary | ICD-10-CM | POA: Diagnosis not present

## 2020-12-20 DIAGNOSIS — M6281 Muscle weakness (generalized): Secondary | ICD-10-CM | POA: Diagnosis not present

## 2020-12-20 DIAGNOSIS — N184 Chronic kidney disease, stage 4 (severe): Secondary | ICD-10-CM | POA: Diagnosis not present

## 2020-12-20 DIAGNOSIS — I639 Cerebral infarction, unspecified: Secondary | ICD-10-CM | POA: Diagnosis not present

## 2020-12-20 DIAGNOSIS — R2681 Unsteadiness on feet: Secondary | ICD-10-CM | POA: Diagnosis not present

## 2020-12-20 DIAGNOSIS — R2689 Other abnormalities of gait and mobility: Secondary | ICD-10-CM | POA: Diagnosis not present

## 2020-12-20 DIAGNOSIS — R0602 Shortness of breath: Secondary | ICD-10-CM | POA: Diagnosis not present

## 2020-12-20 DIAGNOSIS — J9621 Acute and chronic respiratory failure with hypoxia: Secondary | ICD-10-CM | POA: Diagnosis not present

## 2020-12-20 DIAGNOSIS — I5043 Acute on chronic combined systolic (congestive) and diastolic (congestive) heart failure: Secondary | ICD-10-CM | POA: Diagnosis not present

## 2020-12-20 DIAGNOSIS — N183 Chronic kidney disease, stage 3 unspecified: Secondary | ICD-10-CM | POA: Diagnosis not present

## 2020-12-21 DIAGNOSIS — I5043 Acute on chronic combined systolic (congestive) and diastolic (congestive) heart failure: Secondary | ICD-10-CM | POA: Diagnosis not present

## 2020-12-21 DIAGNOSIS — N183 Chronic kidney disease, stage 3 unspecified: Secondary | ICD-10-CM | POA: Diagnosis not present

## 2020-12-21 DIAGNOSIS — R0602 Shortness of breath: Secondary | ICD-10-CM | POA: Diagnosis not present

## 2020-12-21 DIAGNOSIS — N184 Chronic kidney disease, stage 4 (severe): Secondary | ICD-10-CM | POA: Diagnosis not present

## 2020-12-21 DIAGNOSIS — J9621 Acute and chronic respiratory failure with hypoxia: Secondary | ICD-10-CM | POA: Diagnosis not present

## 2020-12-21 DIAGNOSIS — R2681 Unsteadiness on feet: Secondary | ICD-10-CM | POA: Diagnosis not present

## 2020-12-21 DIAGNOSIS — R2689 Other abnormalities of gait and mobility: Secondary | ICD-10-CM | POA: Diagnosis not present

## 2020-12-21 DIAGNOSIS — I639 Cerebral infarction, unspecified: Secondary | ICD-10-CM | POA: Diagnosis not present

## 2020-12-21 DIAGNOSIS — M6281 Muscle weakness (generalized): Secondary | ICD-10-CM | POA: Diagnosis not present

## 2020-12-22 DIAGNOSIS — M6281 Muscle weakness (generalized): Secondary | ICD-10-CM | POA: Diagnosis not present

## 2020-12-22 DIAGNOSIS — R0602 Shortness of breath: Secondary | ICD-10-CM | POA: Diagnosis not present

## 2020-12-22 DIAGNOSIS — I5043 Acute on chronic combined systolic (congestive) and diastolic (congestive) heart failure: Secondary | ICD-10-CM | POA: Diagnosis not present

## 2020-12-22 DIAGNOSIS — N183 Chronic kidney disease, stage 3 unspecified: Secondary | ICD-10-CM | POA: Diagnosis not present

## 2020-12-22 DIAGNOSIS — I639 Cerebral infarction, unspecified: Secondary | ICD-10-CM | POA: Diagnosis not present

## 2020-12-22 DIAGNOSIS — R2689 Other abnormalities of gait and mobility: Secondary | ICD-10-CM | POA: Diagnosis not present

## 2020-12-22 DIAGNOSIS — J9621 Acute and chronic respiratory failure with hypoxia: Secondary | ICD-10-CM | POA: Diagnosis not present

## 2020-12-22 DIAGNOSIS — N184 Chronic kidney disease, stage 4 (severe): Secondary | ICD-10-CM | POA: Diagnosis not present

## 2020-12-22 DIAGNOSIS — R2681 Unsteadiness on feet: Secondary | ICD-10-CM | POA: Diagnosis not present

## 2020-12-23 DIAGNOSIS — I69354 Hemiplegia and hemiparesis following cerebral infarction affecting left non-dominant side: Secondary | ICD-10-CM | POA: Diagnosis not present

## 2020-12-23 DIAGNOSIS — N183 Chronic kidney disease, stage 3 unspecified: Secondary | ICD-10-CM | POA: Diagnosis not present

## 2020-12-23 DIAGNOSIS — N184 Chronic kidney disease, stage 4 (severe): Secondary | ICD-10-CM | POA: Diagnosis not present

## 2020-12-23 DIAGNOSIS — I639 Cerebral infarction, unspecified: Secondary | ICD-10-CM | POA: Diagnosis not present

## 2020-12-23 DIAGNOSIS — J9621 Acute and chronic respiratory failure with hypoxia: Secondary | ICD-10-CM | POA: Diagnosis not present

## 2020-12-23 DIAGNOSIS — R2681 Unsteadiness on feet: Secondary | ICD-10-CM | POA: Diagnosis not present

## 2020-12-23 DIAGNOSIS — M6281 Muscle weakness (generalized): Secondary | ICD-10-CM | POA: Diagnosis not present

## 2020-12-23 DIAGNOSIS — R2689 Other abnormalities of gait and mobility: Secondary | ICD-10-CM | POA: Diagnosis not present

## 2020-12-23 DIAGNOSIS — I5043 Acute on chronic combined systolic (congestive) and diastolic (congestive) heart failure: Secondary | ICD-10-CM | POA: Diagnosis not present

## 2020-12-26 DIAGNOSIS — F330 Major depressive disorder, recurrent, mild: Secondary | ICD-10-CM | POA: Diagnosis not present

## 2020-12-26 DIAGNOSIS — F0390 Unspecified dementia without behavioral disturbance: Secondary | ICD-10-CM | POA: Diagnosis not present

## 2020-12-26 DIAGNOSIS — I5043 Acute on chronic combined systolic (congestive) and diastolic (congestive) heart failure: Secondary | ICD-10-CM | POA: Diagnosis not present

## 2020-12-26 DIAGNOSIS — M6281 Muscle weakness (generalized): Secondary | ICD-10-CM | POA: Diagnosis not present

## 2020-12-26 DIAGNOSIS — I639 Cerebral infarction, unspecified: Secondary | ICD-10-CM | POA: Diagnosis not present

## 2020-12-26 DIAGNOSIS — J9621 Acute and chronic respiratory failure with hypoxia: Secondary | ICD-10-CM | POA: Diagnosis not present

## 2020-12-26 DIAGNOSIS — N184 Chronic kidney disease, stage 4 (severe): Secondary | ICD-10-CM | POA: Diagnosis not present

## 2020-12-26 DIAGNOSIS — R55 Syncope and collapse: Secondary | ICD-10-CM | POA: Diagnosis not present

## 2020-12-26 DIAGNOSIS — N183 Chronic kidney disease, stage 3 unspecified: Secondary | ICD-10-CM | POA: Diagnosis not present

## 2020-12-26 DIAGNOSIS — R2689 Other abnormalities of gait and mobility: Secondary | ICD-10-CM | POA: Diagnosis not present

## 2020-12-26 DIAGNOSIS — I69354 Hemiplegia and hemiparesis following cerebral infarction affecting left non-dominant side: Secondary | ICD-10-CM | POA: Diagnosis not present

## 2020-12-26 DIAGNOSIS — R2681 Unsteadiness on feet: Secondary | ICD-10-CM | POA: Diagnosis not present

## 2020-12-27 DIAGNOSIS — I639 Cerebral infarction, unspecified: Secondary | ICD-10-CM | POA: Diagnosis not present

## 2020-12-27 DIAGNOSIS — I69354 Hemiplegia and hemiparesis following cerebral infarction affecting left non-dominant side: Secondary | ICD-10-CM | POA: Diagnosis not present

## 2020-12-27 DIAGNOSIS — I5043 Acute on chronic combined systolic (congestive) and diastolic (congestive) heart failure: Secondary | ICD-10-CM | POA: Diagnosis not present

## 2020-12-27 DIAGNOSIS — R2681 Unsteadiness on feet: Secondary | ICD-10-CM | POA: Diagnosis not present

## 2020-12-27 DIAGNOSIS — R2689 Other abnormalities of gait and mobility: Secondary | ICD-10-CM | POA: Diagnosis not present

## 2020-12-27 DIAGNOSIS — N183 Chronic kidney disease, stage 3 unspecified: Secondary | ICD-10-CM | POA: Diagnosis not present

## 2020-12-27 DIAGNOSIS — J9621 Acute and chronic respiratory failure with hypoxia: Secondary | ICD-10-CM | POA: Diagnosis not present

## 2020-12-27 DIAGNOSIS — M6281 Muscle weakness (generalized): Secondary | ICD-10-CM | POA: Diagnosis not present

## 2020-12-27 DIAGNOSIS — N184 Chronic kidney disease, stage 4 (severe): Secondary | ICD-10-CM | POA: Diagnosis not present

## 2020-12-28 DIAGNOSIS — J9621 Acute and chronic respiratory failure with hypoxia: Secondary | ICD-10-CM | POA: Diagnosis not present

## 2020-12-28 DIAGNOSIS — R2689 Other abnormalities of gait and mobility: Secondary | ICD-10-CM | POA: Diagnosis not present

## 2020-12-28 DIAGNOSIS — N184 Chronic kidney disease, stage 4 (severe): Secondary | ICD-10-CM | POA: Diagnosis not present

## 2020-12-28 DIAGNOSIS — N183 Chronic kidney disease, stage 3 unspecified: Secondary | ICD-10-CM | POA: Diagnosis not present

## 2020-12-28 DIAGNOSIS — M6281 Muscle weakness (generalized): Secondary | ICD-10-CM | POA: Diagnosis not present

## 2020-12-28 DIAGNOSIS — R2681 Unsteadiness on feet: Secondary | ICD-10-CM | POA: Diagnosis not present

## 2020-12-28 DIAGNOSIS — I639 Cerebral infarction, unspecified: Secondary | ICD-10-CM | POA: Diagnosis not present

## 2020-12-28 DIAGNOSIS — I5043 Acute on chronic combined systolic (congestive) and diastolic (congestive) heart failure: Secondary | ICD-10-CM | POA: Diagnosis not present

## 2020-12-28 DIAGNOSIS — I69354 Hemiplegia and hemiparesis following cerebral infarction affecting left non-dominant side: Secondary | ICD-10-CM | POA: Diagnosis not present

## 2020-12-29 DIAGNOSIS — N184 Chronic kidney disease, stage 4 (severe): Secondary | ICD-10-CM | POA: Diagnosis not present

## 2020-12-29 DIAGNOSIS — D649 Anemia, unspecified: Secondary | ICD-10-CM | POA: Diagnosis not present

## 2020-12-29 DIAGNOSIS — R0602 Shortness of breath: Secondary | ICD-10-CM | POA: Diagnosis not present

## 2020-12-29 DIAGNOSIS — I69354 Hemiplegia and hemiparesis following cerebral infarction affecting left non-dominant side: Secondary | ICD-10-CM | POA: Diagnosis not present

## 2020-12-29 DIAGNOSIS — I639 Cerebral infarction, unspecified: Secondary | ICD-10-CM | POA: Diagnosis not present

## 2020-12-29 DIAGNOSIS — I5043 Acute on chronic combined systolic (congestive) and diastolic (congestive) heart failure: Secondary | ICD-10-CM | POA: Diagnosis not present

## 2020-12-29 DIAGNOSIS — J9621 Acute and chronic respiratory failure with hypoxia: Secondary | ICD-10-CM | POA: Diagnosis not present

## 2020-12-29 DIAGNOSIS — J449 Chronic obstructive pulmonary disease, unspecified: Secondary | ICD-10-CM | POA: Diagnosis not present

## 2020-12-29 DIAGNOSIS — R0989 Other specified symptoms and signs involving the circulatory and respiratory systems: Secondary | ICD-10-CM | POA: Diagnosis not present

## 2020-12-29 DIAGNOSIS — N183 Chronic kidney disease, stage 3 unspecified: Secondary | ICD-10-CM | POA: Diagnosis not present

## 2020-12-29 DIAGNOSIS — R2689 Other abnormalities of gait and mobility: Secondary | ICD-10-CM | POA: Diagnosis not present

## 2020-12-29 DIAGNOSIS — R55 Syncope and collapse: Secondary | ICD-10-CM | POA: Diagnosis not present

## 2020-12-29 DIAGNOSIS — R2681 Unsteadiness on feet: Secondary | ICD-10-CM | POA: Diagnosis not present

## 2020-12-29 DIAGNOSIS — M6281 Muscle weakness (generalized): Secondary | ICD-10-CM | POA: Diagnosis not present

## 2020-12-30 DIAGNOSIS — N183 Chronic kidney disease, stage 3 unspecified: Secondary | ICD-10-CM | POA: Diagnosis not present

## 2020-12-30 DIAGNOSIS — R2681 Unsteadiness on feet: Secondary | ICD-10-CM | POA: Diagnosis not present

## 2020-12-30 DIAGNOSIS — I5043 Acute on chronic combined systolic (congestive) and diastolic (congestive) heart failure: Secondary | ICD-10-CM | POA: Diagnosis not present

## 2020-12-30 DIAGNOSIS — N184 Chronic kidney disease, stage 4 (severe): Secondary | ICD-10-CM | POA: Diagnosis not present

## 2020-12-30 DIAGNOSIS — M6281 Muscle weakness (generalized): Secondary | ICD-10-CM | POA: Diagnosis not present

## 2020-12-30 DIAGNOSIS — I69354 Hemiplegia and hemiparesis following cerebral infarction affecting left non-dominant side: Secondary | ICD-10-CM | POA: Diagnosis not present

## 2020-12-30 DIAGNOSIS — J9621 Acute and chronic respiratory failure with hypoxia: Secondary | ICD-10-CM | POA: Diagnosis not present

## 2020-12-30 DIAGNOSIS — R2689 Other abnormalities of gait and mobility: Secondary | ICD-10-CM | POA: Diagnosis not present

## 2020-12-30 DIAGNOSIS — I639 Cerebral infarction, unspecified: Secondary | ICD-10-CM | POA: Diagnosis not present

## 2021-01-02 DIAGNOSIS — M6281 Muscle weakness (generalized): Secondary | ICD-10-CM | POA: Diagnosis not present

## 2021-01-02 DIAGNOSIS — F0390 Unspecified dementia without behavioral disturbance: Secondary | ICD-10-CM | POA: Diagnosis not present

## 2021-01-02 DIAGNOSIS — I69354 Hemiplegia and hemiparesis following cerebral infarction affecting left non-dominant side: Secondary | ICD-10-CM | POA: Diagnosis not present

## 2021-01-02 DIAGNOSIS — R2689 Other abnormalities of gait and mobility: Secondary | ICD-10-CM | POA: Diagnosis not present

## 2021-01-02 DIAGNOSIS — R2681 Unsteadiness on feet: Secondary | ICD-10-CM | POA: Diagnosis not present

## 2021-01-02 DIAGNOSIS — N184 Chronic kidney disease, stage 4 (severe): Secondary | ICD-10-CM | POA: Diagnosis not present

## 2021-01-02 DIAGNOSIS — N183 Chronic kidney disease, stage 3 unspecified: Secondary | ICD-10-CM | POA: Diagnosis not present

## 2021-01-02 DIAGNOSIS — I639 Cerebral infarction, unspecified: Secondary | ICD-10-CM | POA: Diagnosis not present

## 2021-01-02 DIAGNOSIS — F330 Major depressive disorder, recurrent, mild: Secondary | ICD-10-CM | POA: Diagnosis not present

## 2021-01-02 DIAGNOSIS — J9621 Acute and chronic respiratory failure with hypoxia: Secondary | ICD-10-CM | POA: Diagnosis not present

## 2021-01-02 DIAGNOSIS — I5043 Acute on chronic combined systolic (congestive) and diastolic (congestive) heart failure: Secondary | ICD-10-CM | POA: Diagnosis not present

## 2021-01-03 DIAGNOSIS — M6281 Muscle weakness (generalized): Secondary | ICD-10-CM | POA: Diagnosis not present

## 2021-01-03 DIAGNOSIS — I5043 Acute on chronic combined systolic (congestive) and diastolic (congestive) heart failure: Secondary | ICD-10-CM | POA: Diagnosis not present

## 2021-01-03 DIAGNOSIS — N184 Chronic kidney disease, stage 4 (severe): Secondary | ICD-10-CM | POA: Diagnosis not present

## 2021-01-03 DIAGNOSIS — I639 Cerebral infarction, unspecified: Secondary | ICD-10-CM | POA: Diagnosis not present

## 2021-01-03 DIAGNOSIS — N183 Chronic kidney disease, stage 3 unspecified: Secondary | ICD-10-CM | POA: Diagnosis not present

## 2021-01-03 DIAGNOSIS — J9621 Acute and chronic respiratory failure with hypoxia: Secondary | ICD-10-CM | POA: Diagnosis not present

## 2021-01-03 DIAGNOSIS — I69354 Hemiplegia and hemiparesis following cerebral infarction affecting left non-dominant side: Secondary | ICD-10-CM | POA: Diagnosis not present

## 2021-01-03 DIAGNOSIS — R55 Syncope and collapse: Secondary | ICD-10-CM | POA: Diagnosis not present

## 2021-01-03 DIAGNOSIS — R2689 Other abnormalities of gait and mobility: Secondary | ICD-10-CM | POA: Diagnosis not present

## 2021-01-03 DIAGNOSIS — R2681 Unsteadiness on feet: Secondary | ICD-10-CM | POA: Diagnosis not present

## 2021-01-04 DIAGNOSIS — M6281 Muscle weakness (generalized): Secondary | ICD-10-CM | POA: Diagnosis not present

## 2021-01-04 DIAGNOSIS — N183 Chronic kidney disease, stage 3 unspecified: Secondary | ICD-10-CM | POA: Diagnosis not present

## 2021-01-04 DIAGNOSIS — I5043 Acute on chronic combined systolic (congestive) and diastolic (congestive) heart failure: Secondary | ICD-10-CM | POA: Diagnosis not present

## 2021-01-04 DIAGNOSIS — R4182 Altered mental status, unspecified: Secondary | ICD-10-CM | POA: Diagnosis not present

## 2021-01-04 DIAGNOSIS — J9621 Acute and chronic respiratory failure with hypoxia: Secondary | ICD-10-CM | POA: Diagnosis not present

## 2021-01-04 DIAGNOSIS — R2689 Other abnormalities of gait and mobility: Secondary | ICD-10-CM | POA: Diagnosis not present

## 2021-01-04 DIAGNOSIS — I639 Cerebral infarction, unspecified: Secondary | ICD-10-CM | POA: Diagnosis not present

## 2021-01-04 DIAGNOSIS — R2681 Unsteadiness on feet: Secondary | ICD-10-CM | POA: Diagnosis not present

## 2021-01-04 DIAGNOSIS — N184 Chronic kidney disease, stage 4 (severe): Secondary | ICD-10-CM | POA: Diagnosis not present

## 2021-01-04 DIAGNOSIS — I69354 Hemiplegia and hemiparesis following cerebral infarction affecting left non-dominant side: Secondary | ICD-10-CM | POA: Diagnosis not present

## 2021-01-05 DIAGNOSIS — R2689 Other abnormalities of gait and mobility: Secondary | ICD-10-CM | POA: Diagnosis not present

## 2021-01-05 DIAGNOSIS — I5043 Acute on chronic combined systolic (congestive) and diastolic (congestive) heart failure: Secondary | ICD-10-CM | POA: Diagnosis not present

## 2021-01-05 DIAGNOSIS — I639 Cerebral infarction, unspecified: Secondary | ICD-10-CM | POA: Diagnosis not present

## 2021-01-05 DIAGNOSIS — M6281 Muscle weakness (generalized): Secondary | ICD-10-CM | POA: Diagnosis not present

## 2021-01-05 DIAGNOSIS — N183 Chronic kidney disease, stage 3 unspecified: Secondary | ICD-10-CM | POA: Diagnosis not present

## 2021-01-05 DIAGNOSIS — J9621 Acute and chronic respiratory failure with hypoxia: Secondary | ICD-10-CM | POA: Diagnosis not present

## 2021-01-05 DIAGNOSIS — I69354 Hemiplegia and hemiparesis following cerebral infarction affecting left non-dominant side: Secondary | ICD-10-CM | POA: Diagnosis not present

## 2021-01-05 DIAGNOSIS — N184 Chronic kidney disease, stage 4 (severe): Secondary | ICD-10-CM | POA: Diagnosis not present

## 2021-01-05 DIAGNOSIS — R2681 Unsteadiness on feet: Secondary | ICD-10-CM | POA: Diagnosis not present

## 2021-01-05 DIAGNOSIS — R4182 Altered mental status, unspecified: Secondary | ICD-10-CM | POA: Diagnosis not present

## 2021-01-08 DIAGNOSIS — N183 Chronic kidney disease, stage 3 unspecified: Secondary | ICD-10-CM | POA: Diagnosis not present

## 2021-01-08 DIAGNOSIS — I69354 Hemiplegia and hemiparesis following cerebral infarction affecting left non-dominant side: Secondary | ICD-10-CM | POA: Diagnosis not present

## 2021-01-08 DIAGNOSIS — R2681 Unsteadiness on feet: Secondary | ICD-10-CM | POA: Diagnosis not present

## 2021-01-08 DIAGNOSIS — I5043 Acute on chronic combined systolic (congestive) and diastolic (congestive) heart failure: Secondary | ICD-10-CM | POA: Diagnosis not present

## 2021-01-08 DIAGNOSIS — I639 Cerebral infarction, unspecified: Secondary | ICD-10-CM | POA: Diagnosis not present

## 2021-01-08 DIAGNOSIS — M6281 Muscle weakness (generalized): Secondary | ICD-10-CM | POA: Diagnosis not present

## 2021-01-08 DIAGNOSIS — R2689 Other abnormalities of gait and mobility: Secondary | ICD-10-CM | POA: Diagnosis not present

## 2021-01-08 DIAGNOSIS — N184 Chronic kidney disease, stage 4 (severe): Secondary | ICD-10-CM | POA: Diagnosis not present

## 2021-01-08 DIAGNOSIS — J9621 Acute and chronic respiratory failure with hypoxia: Secondary | ICD-10-CM | POA: Diagnosis not present

## 2021-01-10 DIAGNOSIS — I5043 Acute on chronic combined systolic (congestive) and diastolic (congestive) heart failure: Secondary | ICD-10-CM | POA: Diagnosis not present

## 2021-01-10 DIAGNOSIS — I639 Cerebral infarction, unspecified: Secondary | ICD-10-CM | POA: Diagnosis not present

## 2021-01-10 DIAGNOSIS — R2681 Unsteadiness on feet: Secondary | ICD-10-CM | POA: Diagnosis not present

## 2021-01-10 DIAGNOSIS — I509 Heart failure, unspecified: Secondary | ICD-10-CM | POA: Diagnosis not present

## 2021-01-10 DIAGNOSIS — N184 Chronic kidney disease, stage 4 (severe): Secondary | ICD-10-CM | POA: Diagnosis not present

## 2021-01-10 DIAGNOSIS — M6281 Muscle weakness (generalized): Secondary | ICD-10-CM | POA: Diagnosis not present

## 2021-01-10 DIAGNOSIS — R2689 Other abnormalities of gait and mobility: Secondary | ICD-10-CM | POA: Diagnosis not present

## 2021-01-10 DIAGNOSIS — R55 Syncope and collapse: Secondary | ICD-10-CM | POA: Diagnosis not present

## 2021-01-10 DIAGNOSIS — J9621 Acute and chronic respiratory failure with hypoxia: Secondary | ICD-10-CM | POA: Diagnosis not present

## 2021-01-10 DIAGNOSIS — I69354 Hemiplegia and hemiparesis following cerebral infarction affecting left non-dominant side: Secondary | ICD-10-CM | POA: Diagnosis not present

## 2021-01-10 DIAGNOSIS — N183 Chronic kidney disease, stage 3 unspecified: Secondary | ICD-10-CM | POA: Diagnosis not present

## 2021-01-11 DIAGNOSIS — M6281 Muscle weakness (generalized): Secondary | ICD-10-CM | POA: Diagnosis not present

## 2021-01-11 DIAGNOSIS — R2681 Unsteadiness on feet: Secondary | ICD-10-CM | POA: Diagnosis not present

## 2021-01-11 DIAGNOSIS — N184 Chronic kidney disease, stage 4 (severe): Secondary | ICD-10-CM | POA: Diagnosis not present

## 2021-01-11 DIAGNOSIS — I639 Cerebral infarction, unspecified: Secondary | ICD-10-CM | POA: Diagnosis not present

## 2021-01-11 DIAGNOSIS — I5043 Acute on chronic combined systolic (congestive) and diastolic (congestive) heart failure: Secondary | ICD-10-CM | POA: Diagnosis not present

## 2021-01-11 DIAGNOSIS — I69354 Hemiplegia and hemiparesis following cerebral infarction affecting left non-dominant side: Secondary | ICD-10-CM | POA: Diagnosis not present

## 2021-01-11 DIAGNOSIS — J9621 Acute and chronic respiratory failure with hypoxia: Secondary | ICD-10-CM | POA: Diagnosis not present

## 2021-01-11 DIAGNOSIS — N183 Chronic kidney disease, stage 3 unspecified: Secondary | ICD-10-CM | POA: Diagnosis not present

## 2021-01-11 DIAGNOSIS — R2689 Other abnormalities of gait and mobility: Secondary | ICD-10-CM | POA: Diagnosis not present

## 2021-01-12 DIAGNOSIS — R2681 Unsteadiness on feet: Secondary | ICD-10-CM | POA: Diagnosis not present

## 2021-01-12 DIAGNOSIS — I69354 Hemiplegia and hemiparesis following cerebral infarction affecting left non-dominant side: Secondary | ICD-10-CM | POA: Diagnosis not present

## 2021-01-12 DIAGNOSIS — R2689 Other abnormalities of gait and mobility: Secondary | ICD-10-CM | POA: Diagnosis not present

## 2021-01-12 DIAGNOSIS — I5043 Acute on chronic combined systolic (congestive) and diastolic (congestive) heart failure: Secondary | ICD-10-CM | POA: Diagnosis not present

## 2021-01-12 DIAGNOSIS — N184 Chronic kidney disease, stage 4 (severe): Secondary | ICD-10-CM | POA: Diagnosis not present

## 2021-01-12 DIAGNOSIS — M6281 Muscle weakness (generalized): Secondary | ICD-10-CM | POA: Diagnosis not present

## 2021-01-12 DIAGNOSIS — J9621 Acute and chronic respiratory failure with hypoxia: Secondary | ICD-10-CM | POA: Diagnosis not present

## 2021-01-12 DIAGNOSIS — N183 Chronic kidney disease, stage 3 unspecified: Secondary | ICD-10-CM | POA: Diagnosis not present

## 2021-01-12 DIAGNOSIS — I639 Cerebral infarction, unspecified: Secondary | ICD-10-CM | POA: Diagnosis not present

## 2021-01-13 DIAGNOSIS — I639 Cerebral infarction, unspecified: Secondary | ICD-10-CM | POA: Diagnosis not present

## 2021-01-13 DIAGNOSIS — R2689 Other abnormalities of gait and mobility: Secondary | ICD-10-CM | POA: Diagnosis not present

## 2021-01-13 DIAGNOSIS — I5043 Acute on chronic combined systolic (congestive) and diastolic (congestive) heart failure: Secondary | ICD-10-CM | POA: Diagnosis not present

## 2021-01-13 DIAGNOSIS — I69354 Hemiplegia and hemiparesis following cerebral infarction affecting left non-dominant side: Secondary | ICD-10-CM | POA: Diagnosis not present

## 2021-01-13 DIAGNOSIS — N184 Chronic kidney disease, stage 4 (severe): Secondary | ICD-10-CM | POA: Diagnosis not present

## 2021-01-13 DIAGNOSIS — J9621 Acute and chronic respiratory failure with hypoxia: Secondary | ICD-10-CM | POA: Diagnosis not present

## 2021-01-13 DIAGNOSIS — N183 Chronic kidney disease, stage 3 unspecified: Secondary | ICD-10-CM | POA: Diagnosis not present

## 2021-01-13 DIAGNOSIS — M6281 Muscle weakness (generalized): Secondary | ICD-10-CM | POA: Diagnosis not present

## 2021-01-13 DIAGNOSIS — R2681 Unsteadiness on feet: Secondary | ICD-10-CM | POA: Diagnosis not present

## 2021-01-16 DIAGNOSIS — N183 Chronic kidney disease, stage 3 unspecified: Secondary | ICD-10-CM | POA: Diagnosis not present

## 2021-01-16 DIAGNOSIS — R2681 Unsteadiness on feet: Secondary | ICD-10-CM | POA: Diagnosis not present

## 2021-01-16 DIAGNOSIS — I639 Cerebral infarction, unspecified: Secondary | ICD-10-CM | POA: Diagnosis not present

## 2021-01-16 DIAGNOSIS — J9621 Acute and chronic respiratory failure with hypoxia: Secondary | ICD-10-CM | POA: Diagnosis not present

## 2021-01-16 DIAGNOSIS — M6281 Muscle weakness (generalized): Secondary | ICD-10-CM | POA: Diagnosis not present

## 2021-01-16 DIAGNOSIS — I5043 Acute on chronic combined systolic (congestive) and diastolic (congestive) heart failure: Secondary | ICD-10-CM | POA: Diagnosis not present

## 2021-01-16 DIAGNOSIS — R2689 Other abnormalities of gait and mobility: Secondary | ICD-10-CM | POA: Diagnosis not present

## 2021-01-16 DIAGNOSIS — I69354 Hemiplegia and hemiparesis following cerebral infarction affecting left non-dominant side: Secondary | ICD-10-CM | POA: Diagnosis not present

## 2021-01-16 DIAGNOSIS — N184 Chronic kidney disease, stage 4 (severe): Secondary | ICD-10-CM | POA: Diagnosis not present

## 2021-01-17 DIAGNOSIS — I639 Cerebral infarction, unspecified: Secondary | ICD-10-CM | POA: Diagnosis not present

## 2021-01-17 DIAGNOSIS — M6281 Muscle weakness (generalized): Secondary | ICD-10-CM | POA: Diagnosis not present

## 2021-01-17 DIAGNOSIS — N183 Chronic kidney disease, stage 3 unspecified: Secondary | ICD-10-CM | POA: Diagnosis not present

## 2021-01-17 DIAGNOSIS — N184 Chronic kidney disease, stage 4 (severe): Secondary | ICD-10-CM | POA: Diagnosis not present

## 2021-01-17 DIAGNOSIS — R2681 Unsteadiness on feet: Secondary | ICD-10-CM | POA: Diagnosis not present

## 2021-01-17 DIAGNOSIS — I5043 Acute on chronic combined systolic (congestive) and diastolic (congestive) heart failure: Secondary | ICD-10-CM | POA: Diagnosis not present

## 2021-01-17 DIAGNOSIS — R2689 Other abnormalities of gait and mobility: Secondary | ICD-10-CM | POA: Diagnosis not present

## 2021-01-17 DIAGNOSIS — J9621 Acute and chronic respiratory failure with hypoxia: Secondary | ICD-10-CM | POA: Diagnosis not present

## 2021-01-17 DIAGNOSIS — I69354 Hemiplegia and hemiparesis following cerebral infarction affecting left non-dominant side: Secondary | ICD-10-CM | POA: Diagnosis not present

## 2021-01-18 DIAGNOSIS — R2689 Other abnormalities of gait and mobility: Secondary | ICD-10-CM | POA: Diagnosis not present

## 2021-01-18 DIAGNOSIS — I639 Cerebral infarction, unspecified: Secondary | ICD-10-CM | POA: Diagnosis not present

## 2021-01-18 DIAGNOSIS — N183 Chronic kidney disease, stage 3 unspecified: Secondary | ICD-10-CM | POA: Diagnosis not present

## 2021-01-18 DIAGNOSIS — N184 Chronic kidney disease, stage 4 (severe): Secondary | ICD-10-CM | POA: Diagnosis not present

## 2021-01-18 DIAGNOSIS — I5043 Acute on chronic combined systolic (congestive) and diastolic (congestive) heart failure: Secondary | ICD-10-CM | POA: Diagnosis not present

## 2021-01-18 DIAGNOSIS — R2681 Unsteadiness on feet: Secondary | ICD-10-CM | POA: Diagnosis not present

## 2021-01-18 DIAGNOSIS — I69354 Hemiplegia and hemiparesis following cerebral infarction affecting left non-dominant side: Secondary | ICD-10-CM | POA: Diagnosis not present

## 2021-01-18 DIAGNOSIS — M6281 Muscle weakness (generalized): Secondary | ICD-10-CM | POA: Diagnosis not present

## 2021-01-18 DIAGNOSIS — J9621 Acute and chronic respiratory failure with hypoxia: Secondary | ICD-10-CM | POA: Diagnosis not present

## 2021-01-19 DIAGNOSIS — R2681 Unsteadiness on feet: Secondary | ICD-10-CM | POA: Diagnosis not present

## 2021-01-19 DIAGNOSIS — M6281 Muscle weakness (generalized): Secondary | ICD-10-CM | POA: Diagnosis not present

## 2021-01-19 DIAGNOSIS — N184 Chronic kidney disease, stage 4 (severe): Secondary | ICD-10-CM | POA: Diagnosis not present

## 2021-01-19 DIAGNOSIS — I69354 Hemiplegia and hemiparesis following cerebral infarction affecting left non-dominant side: Secondary | ICD-10-CM | POA: Diagnosis not present

## 2021-01-19 DIAGNOSIS — J9621 Acute and chronic respiratory failure with hypoxia: Secondary | ICD-10-CM | POA: Diagnosis not present

## 2021-01-19 DIAGNOSIS — I639 Cerebral infarction, unspecified: Secondary | ICD-10-CM | POA: Diagnosis not present

## 2021-01-19 DIAGNOSIS — I5043 Acute on chronic combined systolic (congestive) and diastolic (congestive) heart failure: Secondary | ICD-10-CM | POA: Diagnosis not present

## 2021-01-19 DIAGNOSIS — R2689 Other abnormalities of gait and mobility: Secondary | ICD-10-CM | POA: Diagnosis not present

## 2021-01-19 DIAGNOSIS — N183 Chronic kidney disease, stage 3 unspecified: Secondary | ICD-10-CM | POA: Diagnosis not present

## 2021-01-20 DIAGNOSIS — I69354 Hemiplegia and hemiparesis following cerebral infarction affecting left non-dominant side: Secondary | ICD-10-CM | POA: Diagnosis not present

## 2021-01-20 DIAGNOSIS — I5043 Acute on chronic combined systolic (congestive) and diastolic (congestive) heart failure: Secondary | ICD-10-CM | POA: Diagnosis not present

## 2021-01-20 DIAGNOSIS — I639 Cerebral infarction, unspecified: Secondary | ICD-10-CM | POA: Diagnosis not present

## 2021-01-20 DIAGNOSIS — N183 Chronic kidney disease, stage 3 unspecified: Secondary | ICD-10-CM | POA: Diagnosis not present

## 2021-01-20 DIAGNOSIS — J9621 Acute and chronic respiratory failure with hypoxia: Secondary | ICD-10-CM | POA: Diagnosis not present

## 2021-01-20 DIAGNOSIS — R2689 Other abnormalities of gait and mobility: Secondary | ICD-10-CM | POA: Diagnosis not present

## 2021-01-20 DIAGNOSIS — R2681 Unsteadiness on feet: Secondary | ICD-10-CM | POA: Diagnosis not present

## 2021-01-20 DIAGNOSIS — M6281 Muscle weakness (generalized): Secondary | ICD-10-CM | POA: Diagnosis not present

## 2021-01-20 DIAGNOSIS — N184 Chronic kidney disease, stage 4 (severe): Secondary | ICD-10-CM | POA: Diagnosis not present

## 2021-01-23 DIAGNOSIS — R2689 Other abnormalities of gait and mobility: Secondary | ICD-10-CM | POA: Diagnosis not present

## 2021-01-23 DIAGNOSIS — R2681 Unsteadiness on feet: Secondary | ICD-10-CM | POA: Diagnosis not present

## 2021-01-23 DIAGNOSIS — N183 Chronic kidney disease, stage 3 unspecified: Secondary | ICD-10-CM | POA: Diagnosis not present

## 2021-01-23 DIAGNOSIS — N184 Chronic kidney disease, stage 4 (severe): Secondary | ICD-10-CM | POA: Diagnosis not present

## 2021-01-23 DIAGNOSIS — M6281 Muscle weakness (generalized): Secondary | ICD-10-CM | POA: Diagnosis not present

## 2021-01-23 DIAGNOSIS — I5043 Acute on chronic combined systolic (congestive) and diastolic (congestive) heart failure: Secondary | ICD-10-CM | POA: Diagnosis not present

## 2021-01-23 DIAGNOSIS — I639 Cerebral infarction, unspecified: Secondary | ICD-10-CM | POA: Diagnosis not present

## 2021-01-23 DIAGNOSIS — J9621 Acute and chronic respiratory failure with hypoxia: Secondary | ICD-10-CM | POA: Diagnosis not present

## 2021-01-23 DIAGNOSIS — I69354 Hemiplegia and hemiparesis following cerebral infarction affecting left non-dominant side: Secondary | ICD-10-CM | POA: Diagnosis not present

## 2021-01-24 DIAGNOSIS — I5043 Acute on chronic combined systolic (congestive) and diastolic (congestive) heart failure: Secondary | ICD-10-CM | POA: Diagnosis not present

## 2021-01-24 DIAGNOSIS — N184 Chronic kidney disease, stage 4 (severe): Secondary | ICD-10-CM | POA: Diagnosis not present

## 2021-01-24 DIAGNOSIS — J9621 Acute and chronic respiratory failure with hypoxia: Secondary | ICD-10-CM | POA: Diagnosis not present

## 2021-01-24 DIAGNOSIS — I69354 Hemiplegia and hemiparesis following cerebral infarction affecting left non-dominant side: Secondary | ICD-10-CM | POA: Diagnosis not present

## 2021-01-24 DIAGNOSIS — N183 Chronic kidney disease, stage 3 unspecified: Secondary | ICD-10-CM | POA: Diagnosis not present

## 2021-01-24 DIAGNOSIS — R2681 Unsteadiness on feet: Secondary | ICD-10-CM | POA: Diagnosis not present

## 2021-01-24 DIAGNOSIS — R2689 Other abnormalities of gait and mobility: Secondary | ICD-10-CM | POA: Diagnosis not present

## 2021-01-24 DIAGNOSIS — I639 Cerebral infarction, unspecified: Secondary | ICD-10-CM | POA: Diagnosis not present

## 2021-01-24 DIAGNOSIS — M6281 Muscle weakness (generalized): Secondary | ICD-10-CM | POA: Diagnosis not present

## 2021-01-25 DIAGNOSIS — I639 Cerebral infarction, unspecified: Secondary | ICD-10-CM | POA: Diagnosis not present

## 2021-01-25 DIAGNOSIS — N184 Chronic kidney disease, stage 4 (severe): Secondary | ICD-10-CM | POA: Diagnosis not present

## 2021-01-25 DIAGNOSIS — I69354 Hemiplegia and hemiparesis following cerebral infarction affecting left non-dominant side: Secondary | ICD-10-CM | POA: Diagnosis not present

## 2021-01-25 DIAGNOSIS — R2681 Unsteadiness on feet: Secondary | ICD-10-CM | POA: Diagnosis not present

## 2021-01-25 DIAGNOSIS — N183 Chronic kidney disease, stage 3 unspecified: Secondary | ICD-10-CM | POA: Diagnosis not present

## 2021-01-25 DIAGNOSIS — J9621 Acute and chronic respiratory failure with hypoxia: Secondary | ICD-10-CM | POA: Diagnosis not present

## 2021-01-25 DIAGNOSIS — R2689 Other abnormalities of gait and mobility: Secondary | ICD-10-CM | POA: Diagnosis not present

## 2021-01-25 DIAGNOSIS — I5043 Acute on chronic combined systolic (congestive) and diastolic (congestive) heart failure: Secondary | ICD-10-CM | POA: Diagnosis not present

## 2021-01-25 DIAGNOSIS — M6281 Muscle weakness (generalized): Secondary | ICD-10-CM | POA: Diagnosis not present

## 2021-01-26 DIAGNOSIS — N183 Chronic kidney disease, stage 3 unspecified: Secondary | ICD-10-CM | POA: Diagnosis not present

## 2021-01-26 DIAGNOSIS — M6281 Muscle weakness (generalized): Secondary | ICD-10-CM | POA: Diagnosis not present

## 2021-01-26 DIAGNOSIS — N184 Chronic kidney disease, stage 4 (severe): Secondary | ICD-10-CM | POA: Diagnosis not present

## 2021-01-26 DIAGNOSIS — I5043 Acute on chronic combined systolic (congestive) and diastolic (congestive) heart failure: Secondary | ICD-10-CM | POA: Diagnosis not present

## 2021-01-26 DIAGNOSIS — R2689 Other abnormalities of gait and mobility: Secondary | ICD-10-CM | POA: Diagnosis not present

## 2021-01-26 DIAGNOSIS — R2681 Unsteadiness on feet: Secondary | ICD-10-CM | POA: Diagnosis not present

## 2021-01-26 DIAGNOSIS — I69354 Hemiplegia and hemiparesis following cerebral infarction affecting left non-dominant side: Secondary | ICD-10-CM | POA: Diagnosis not present

## 2021-01-26 DIAGNOSIS — J9621 Acute and chronic respiratory failure with hypoxia: Secondary | ICD-10-CM | POA: Diagnosis not present

## 2021-01-26 DIAGNOSIS — I639 Cerebral infarction, unspecified: Secondary | ICD-10-CM | POA: Diagnosis not present

## 2021-01-29 DIAGNOSIS — I69354 Hemiplegia and hemiparesis following cerebral infarction affecting left non-dominant side: Secondary | ICD-10-CM | POA: Diagnosis not present

## 2021-01-29 DIAGNOSIS — J9621 Acute and chronic respiratory failure with hypoxia: Secondary | ICD-10-CM | POA: Diagnosis not present

## 2021-01-29 DIAGNOSIS — R2689 Other abnormalities of gait and mobility: Secondary | ICD-10-CM | POA: Diagnosis not present

## 2021-01-29 DIAGNOSIS — N183 Chronic kidney disease, stage 3 unspecified: Secondary | ICD-10-CM | POA: Diagnosis not present

## 2021-01-29 DIAGNOSIS — M6281 Muscle weakness (generalized): Secondary | ICD-10-CM | POA: Diagnosis not present

## 2021-01-29 DIAGNOSIS — R2681 Unsteadiness on feet: Secondary | ICD-10-CM | POA: Diagnosis not present

## 2021-01-29 DIAGNOSIS — I5043 Acute on chronic combined systolic (congestive) and diastolic (congestive) heart failure: Secondary | ICD-10-CM | POA: Diagnosis not present

## 2021-01-29 DIAGNOSIS — N184 Chronic kidney disease, stage 4 (severe): Secondary | ICD-10-CM | POA: Diagnosis not present

## 2021-01-29 DIAGNOSIS — I639 Cerebral infarction, unspecified: Secondary | ICD-10-CM | POA: Diagnosis not present

## 2021-01-30 DIAGNOSIS — I5043 Acute on chronic combined systolic (congestive) and diastolic (congestive) heart failure: Secondary | ICD-10-CM | POA: Diagnosis not present

## 2021-01-30 DIAGNOSIS — N184 Chronic kidney disease, stage 4 (severe): Secondary | ICD-10-CM | POA: Diagnosis not present

## 2021-01-30 DIAGNOSIS — J9621 Acute and chronic respiratory failure with hypoxia: Secondary | ICD-10-CM | POA: Diagnosis not present

## 2021-01-30 DIAGNOSIS — M6281 Muscle weakness (generalized): Secondary | ICD-10-CM | POA: Diagnosis not present

## 2021-01-30 DIAGNOSIS — R2681 Unsteadiness on feet: Secondary | ICD-10-CM | POA: Diagnosis not present

## 2021-01-30 DIAGNOSIS — I69354 Hemiplegia and hemiparesis following cerebral infarction affecting left non-dominant side: Secondary | ICD-10-CM | POA: Diagnosis not present

## 2021-01-30 DIAGNOSIS — N183 Chronic kidney disease, stage 3 unspecified: Secondary | ICD-10-CM | POA: Diagnosis not present

## 2021-01-30 DIAGNOSIS — I639 Cerebral infarction, unspecified: Secondary | ICD-10-CM | POA: Diagnosis not present

## 2021-01-30 DIAGNOSIS — R2689 Other abnormalities of gait and mobility: Secondary | ICD-10-CM | POA: Diagnosis not present

## 2021-01-31 DIAGNOSIS — I5043 Acute on chronic combined systolic (congestive) and diastolic (congestive) heart failure: Secondary | ICD-10-CM | POA: Diagnosis not present

## 2021-01-31 DIAGNOSIS — R2681 Unsteadiness on feet: Secondary | ICD-10-CM | POA: Diagnosis not present

## 2021-01-31 DIAGNOSIS — M6281 Muscle weakness (generalized): Secondary | ICD-10-CM | POA: Diagnosis not present

## 2021-01-31 DIAGNOSIS — R2689 Other abnormalities of gait and mobility: Secondary | ICD-10-CM | POA: Diagnosis not present

## 2021-01-31 DIAGNOSIS — N184 Chronic kidney disease, stage 4 (severe): Secondary | ICD-10-CM | POA: Diagnosis not present

## 2021-01-31 DIAGNOSIS — I639 Cerebral infarction, unspecified: Secondary | ICD-10-CM | POA: Diagnosis not present

## 2021-01-31 DIAGNOSIS — N183 Chronic kidney disease, stage 3 unspecified: Secondary | ICD-10-CM | POA: Diagnosis not present

## 2021-01-31 DIAGNOSIS — I69354 Hemiplegia and hemiparesis following cerebral infarction affecting left non-dominant side: Secondary | ICD-10-CM | POA: Diagnosis not present

## 2021-01-31 DIAGNOSIS — J9621 Acute and chronic respiratory failure with hypoxia: Secondary | ICD-10-CM | POA: Diagnosis not present

## 2021-02-09 DIAGNOSIS — I69354 Hemiplegia and hemiparesis following cerebral infarction affecting left non-dominant side: Secondary | ICD-10-CM | POA: Diagnosis not present

## 2021-02-09 DIAGNOSIS — R55 Syncope and collapse: Secondary | ICD-10-CM | POA: Diagnosis not present

## 2021-02-09 DIAGNOSIS — I2510 Atherosclerotic heart disease of native coronary artery without angina pectoris: Secondary | ICD-10-CM | POA: Diagnosis not present

## 2021-02-09 DIAGNOSIS — J449 Chronic obstructive pulmonary disease, unspecified: Secondary | ICD-10-CM | POA: Diagnosis not present

## 2021-02-16 IMAGING — CT CT CERVICAL SPINE W/O CM
3 of 4 series · 15 of 33 positions shown, 18 images · non-contrast
Comparison: 06/15/2019

CLINICAL DATA: Mechanical fall in the intra way, impact to head and
concrete floor.

EXAM:
CT CERVICAL SPINE WITHOUT CONTRAST
TECHNIQUE: Multidetector CT imaging of the cervical spine was performed without
intravenous contrast. Multiplanar CT image reconstructions were also
generated.

[Series 6: c_spine 2.0 sag bone · sagittal · 0.30mm/px · 5 of 68 slices shown, 6 images]
[im 23/68  bone]
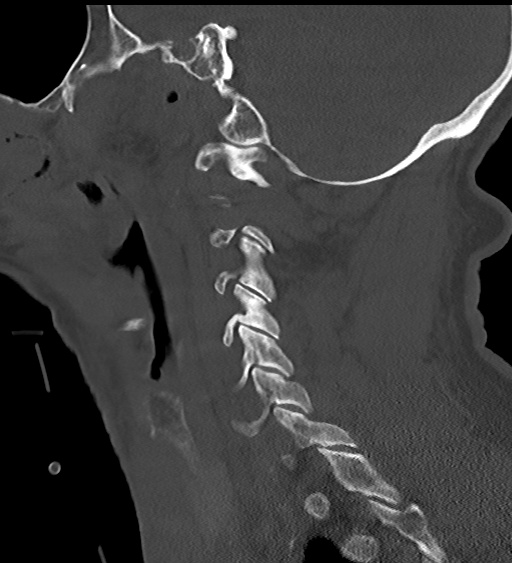
[im 28/68  bone]
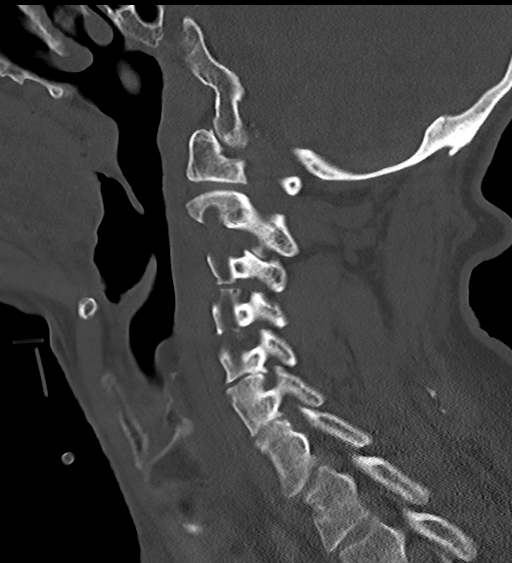
[im 34/68  soft-tissue]
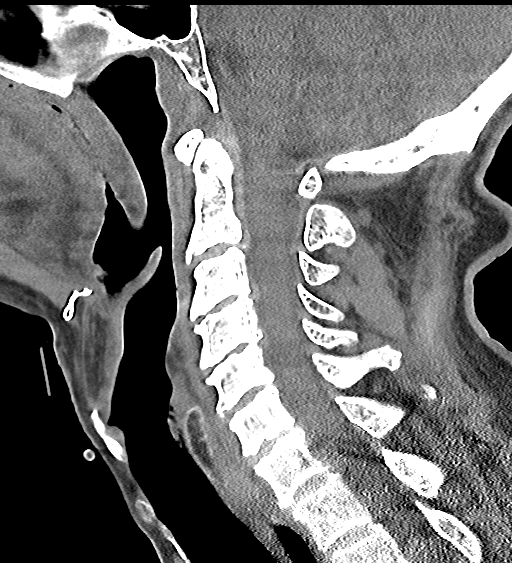
[im 34/68  bone]
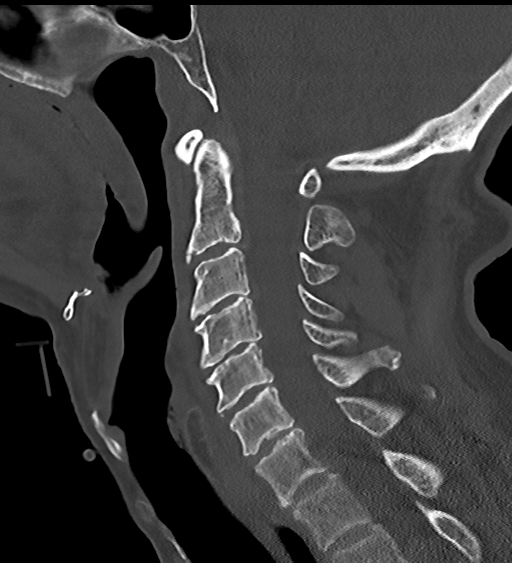
[im 40/68  bone]
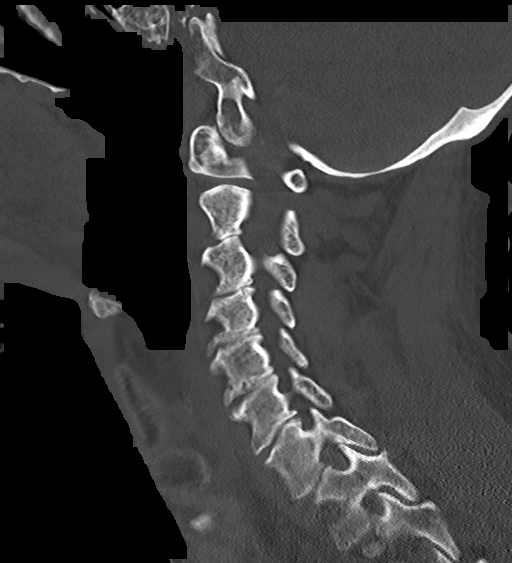
[im 45/68  bone]
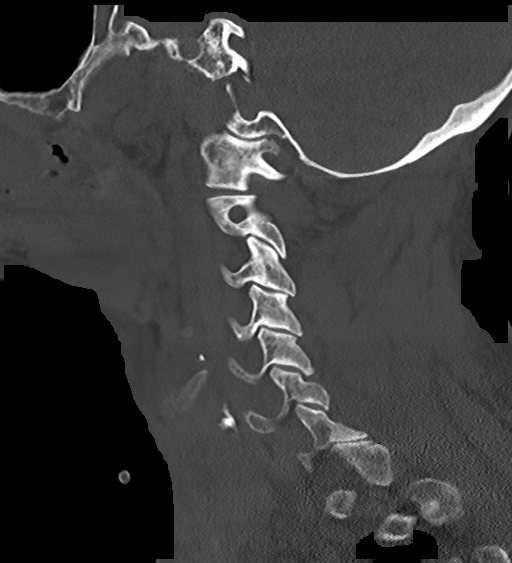

[Series 7: c_spine 2.0 cor bone · coronal · 0.24mm/px · 3 of 67 slices shown]
[im 14/67  bone]
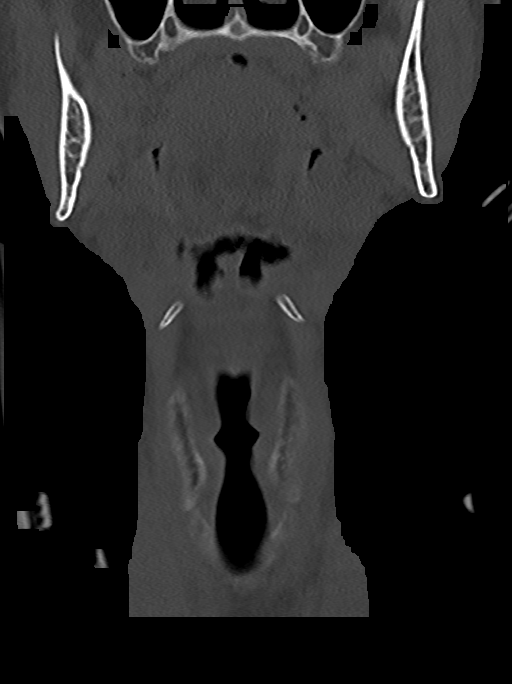
[im 27/67  bone]
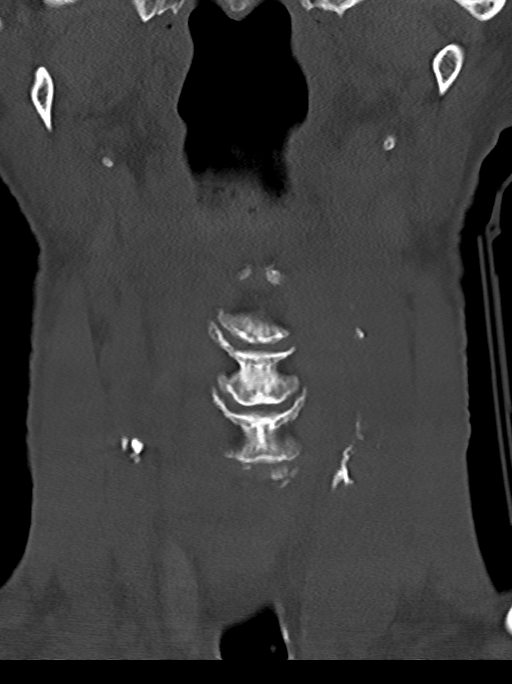
[im 40/67  bone]
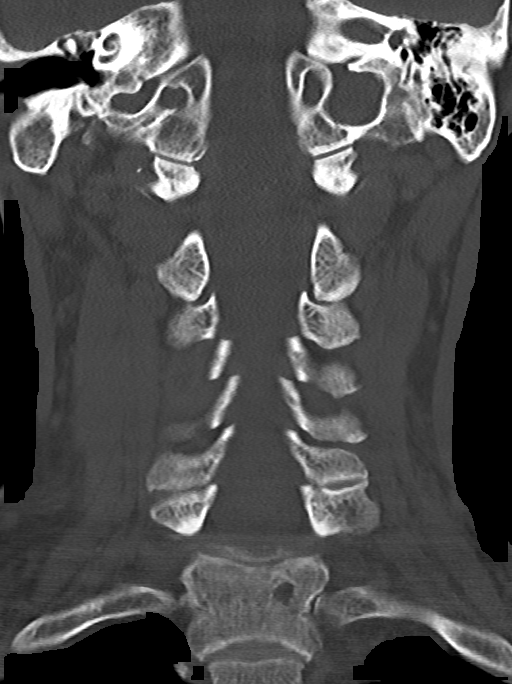

[Series 9: c_spine 1.0 st thins · axial · 0.32mm/px · z∈[+1442,+1575]mm · 7 of 238 slices shown, 9 images]
[im 24/238  soft-tissue]
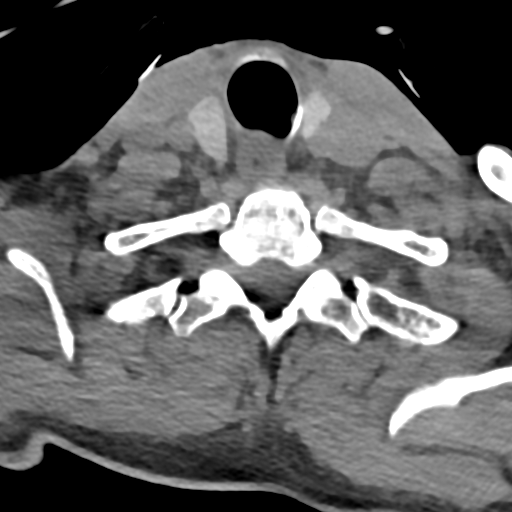
[im 24/238  bone]
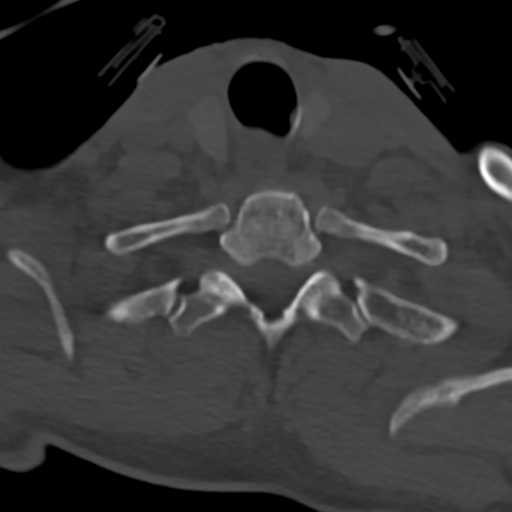
[im 48/238  bone]
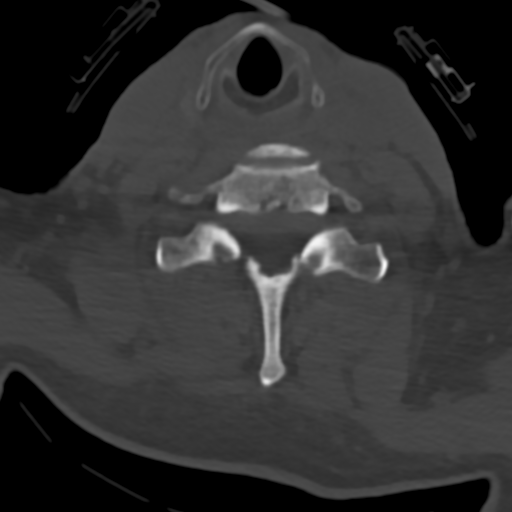
[im 95/238  bone]
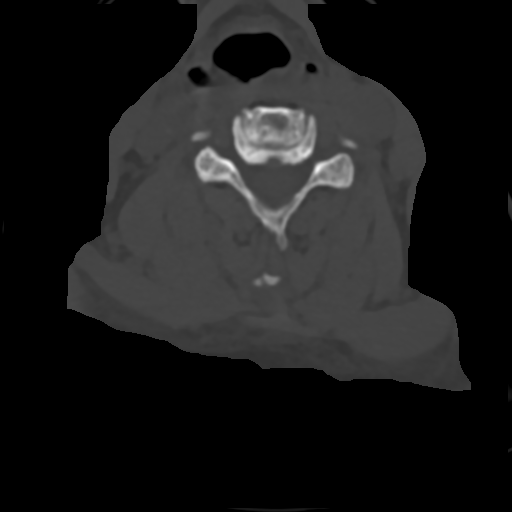
[im 119/238  bone]
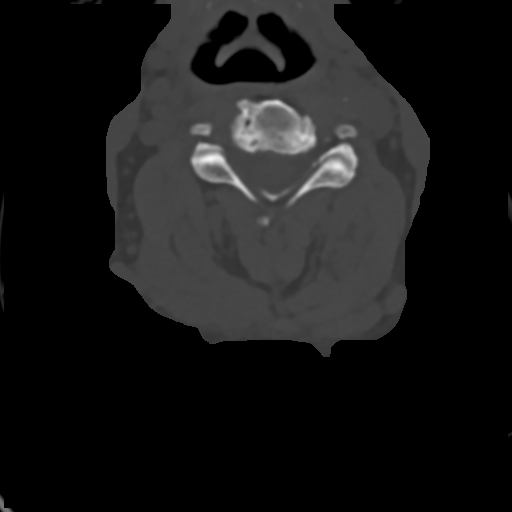
[im 143/238  soft-tissue]
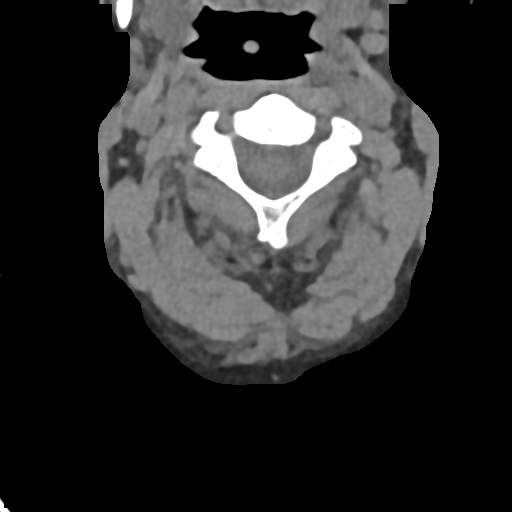
[im 143/238  bone]
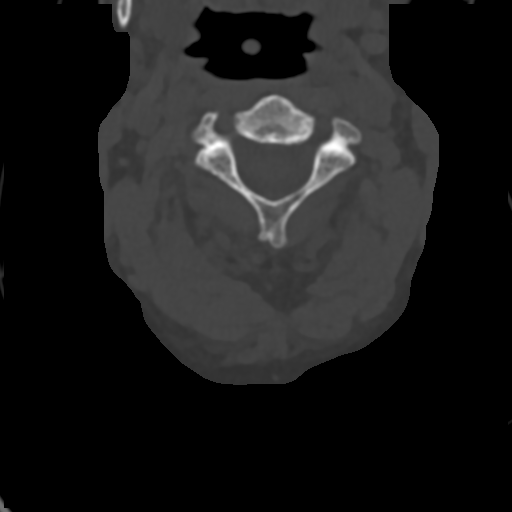
[im 190/238  bone]
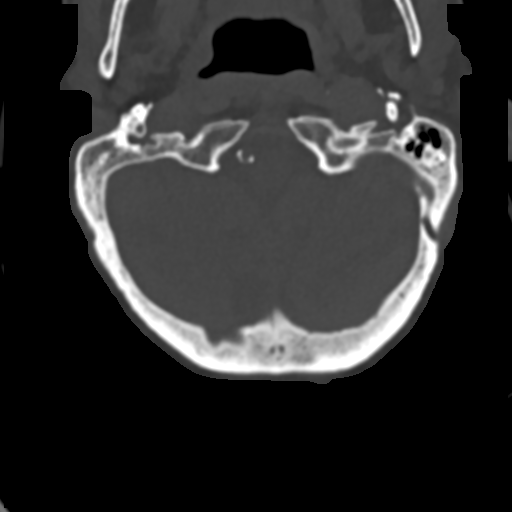
[im 214/238  bone]
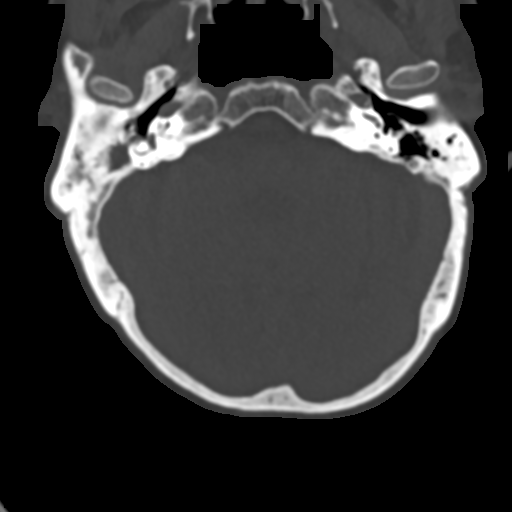

[15 of 33 positions shown; findings below may reference images not displayed]

FINDINGS: Alignment: Normal.

Skull base and vertebrae: No acute fracture. No primary bone lesion
or focal pathologic process.

Soft tissues and spinal canal: No prevertebral fluid or swelling. No
visible canal hematoma. Atherosclerotic calcifications in the
bilateral carotid arteries.

Disc levels: Multilevel spinal degenerative changes without change
from the previous exam.

Upper chest: Signs of emphysema at the lung apices as before. Some
apical scarring as well.

Other: None
IMPRESSION: 1. Spinal degenerative changes without acute fracture of the
cervical spine.
2. Pulmonary emphysema.
3. Incidental note again made of atherosclerotic calcifications,
dense calcifications of the carotid bulbs bilaterally.

## 2021-02-22 DIAGNOSIS — Z9981 Dependence on supplemental oxygen: Secondary | ICD-10-CM | POA: Diagnosis not present

## 2021-02-22 DIAGNOSIS — J9611 Chronic respiratory failure with hypoxia: Secondary | ICD-10-CM | POA: Diagnosis not present

## 2021-02-22 DIAGNOSIS — J449 Chronic obstructive pulmonary disease, unspecified: Secondary | ICD-10-CM | POA: Diagnosis not present

## 2021-02-22 DIAGNOSIS — I69354 Hemiplegia and hemiparesis following cerebral infarction affecting left non-dominant side: Secondary | ICD-10-CM | POA: Diagnosis not present

## 2021-02-22 DIAGNOSIS — Z8673 Personal history of transient ischemic attack (TIA), and cerebral infarction without residual deficits: Secondary | ICD-10-CM | POA: Diagnosis not present

## 2021-02-22 DIAGNOSIS — I130 Hypertensive heart and chronic kidney disease with heart failure and stage 1 through stage 4 chronic kidney disease, or unspecified chronic kidney disease: Secondary | ICD-10-CM | POA: Diagnosis not present

## 2021-02-22 DIAGNOSIS — I5043 Acute on chronic combined systolic (congestive) and diastolic (congestive) heart failure: Secondary | ICD-10-CM | POA: Diagnosis not present

## 2021-02-22 DIAGNOSIS — E559 Vitamin D deficiency, unspecified: Secondary | ICD-10-CM | POA: Diagnosis not present

## 2021-02-22 DIAGNOSIS — K5901 Slow transit constipation: Secondary | ICD-10-CM | POA: Diagnosis not present

## 2021-03-06 DIAGNOSIS — J441 Chronic obstructive pulmonary disease with (acute) exacerbation: Secondary | ICD-10-CM | POA: Diagnosis not present

## 2021-03-06 DIAGNOSIS — I69854 Hemiplegia and hemiparesis following other cerebrovascular disease affecting left non-dominant side: Secondary | ICD-10-CM | POA: Diagnosis not present

## 2021-03-06 DIAGNOSIS — M328 Other forms of systemic lupus erythematosus: Secondary | ICD-10-CM | POA: Diagnosis not present

## 2021-03-06 DIAGNOSIS — I5022 Chronic systolic (congestive) heart failure: Secondary | ICD-10-CM | POA: Diagnosis not present

## 2021-03-06 DIAGNOSIS — I480 Paroxysmal atrial fibrillation: Secondary | ICD-10-CM | POA: Diagnosis not present

## 2021-03-06 DIAGNOSIS — N184 Chronic kidney disease, stage 4 (severe): Secondary | ICD-10-CM | POA: Diagnosis not present

## 2021-03-06 DIAGNOSIS — M6281 Muscle weakness (generalized): Secondary | ICD-10-CM | POA: Diagnosis not present

## 2021-03-31 DIAGNOSIS — J9611 Chronic respiratory failure with hypoxia: Secondary | ICD-10-CM | POA: Diagnosis not present

## 2021-03-31 DIAGNOSIS — M328 Other forms of systemic lupus erythematosus: Secondary | ICD-10-CM | POA: Diagnosis not present

## 2021-03-31 DIAGNOSIS — M6281 Muscle weakness (generalized): Secondary | ICD-10-CM | POA: Diagnosis not present

## 2021-03-31 DIAGNOSIS — N184 Chronic kidney disease, stage 4 (severe): Secondary | ICD-10-CM | POA: Diagnosis not present

## 2021-03-31 DIAGNOSIS — I480 Paroxysmal atrial fibrillation: Secondary | ICD-10-CM | POA: Diagnosis not present

## 2021-03-31 DIAGNOSIS — I69854 Hemiplegia and hemiparesis following other cerebrovascular disease affecting left non-dominant side: Secondary | ICD-10-CM | POA: Diagnosis not present

## 2021-03-31 DIAGNOSIS — I5022 Chronic systolic (congestive) heart failure: Secondary | ICD-10-CM | POA: Diagnosis not present

## 2021-03-31 DIAGNOSIS — J449 Chronic obstructive pulmonary disease, unspecified: Secondary | ICD-10-CM | POA: Diagnosis not present

## 2021-03-31 DIAGNOSIS — J441 Chronic obstructive pulmonary disease with (acute) exacerbation: Secondary | ICD-10-CM | POA: Diagnosis not present

## 2021-04-10 DIAGNOSIS — F339 Major depressive disorder, recurrent, unspecified: Secondary | ICD-10-CM | POA: Diagnosis not present

## 2021-04-11 DIAGNOSIS — B351 Tinea unguium: Secondary | ICD-10-CM | POA: Diagnosis not present

## 2021-04-11 DIAGNOSIS — R262 Difficulty in walking, not elsewhere classified: Secondary | ICD-10-CM | POA: Diagnosis not present

## 2021-04-11 DIAGNOSIS — I739 Peripheral vascular disease, unspecified: Secondary | ICD-10-CM | POA: Diagnosis not present

## 2021-04-11 DIAGNOSIS — R600 Localized edema: Secondary | ICD-10-CM | POA: Diagnosis not present

## 2021-04-12 DIAGNOSIS — I5043 Acute on chronic combined systolic (congestive) and diastolic (congestive) heart failure: Secondary | ICD-10-CM | POA: Diagnosis not present

## 2021-04-12 DIAGNOSIS — R2681 Unsteadiness on feet: Secondary | ICD-10-CM | POA: Diagnosis not present

## 2021-04-12 DIAGNOSIS — I69354 Hemiplegia and hemiparesis following cerebral infarction affecting left non-dominant side: Secondary | ICD-10-CM | POA: Diagnosis not present

## 2021-04-12 DIAGNOSIS — N183 Chronic kidney disease, stage 3 unspecified: Secondary | ICD-10-CM | POA: Diagnosis not present

## 2021-04-12 DIAGNOSIS — N184 Chronic kidney disease, stage 4 (severe): Secondary | ICD-10-CM | POA: Diagnosis not present

## 2021-04-12 DIAGNOSIS — R2689 Other abnormalities of gait and mobility: Secondary | ICD-10-CM | POA: Diagnosis not present

## 2021-04-12 DIAGNOSIS — M6281 Muscle weakness (generalized): Secondary | ICD-10-CM | POA: Diagnosis not present

## 2021-04-12 DIAGNOSIS — I639 Cerebral infarction, unspecified: Secondary | ICD-10-CM | POA: Diagnosis not present

## 2021-04-12 DIAGNOSIS — R0602 Shortness of breath: Secondary | ICD-10-CM | POA: Diagnosis not present

## 2021-04-14 DIAGNOSIS — R2681 Unsteadiness on feet: Secondary | ICD-10-CM | POA: Diagnosis not present

## 2021-04-14 DIAGNOSIS — M6281 Muscle weakness (generalized): Secondary | ICD-10-CM | POA: Diagnosis not present

## 2021-04-14 DIAGNOSIS — R2689 Other abnormalities of gait and mobility: Secondary | ICD-10-CM | POA: Diagnosis not present

## 2021-04-14 DIAGNOSIS — I69354 Hemiplegia and hemiparesis following cerebral infarction affecting left non-dominant side: Secondary | ICD-10-CM | POA: Diagnosis not present

## 2021-04-14 DIAGNOSIS — I5043 Acute on chronic combined systolic (congestive) and diastolic (congestive) heart failure: Secondary | ICD-10-CM | POA: Diagnosis not present

## 2021-04-14 DIAGNOSIS — I639 Cerebral infarction, unspecified: Secondary | ICD-10-CM | POA: Diagnosis not present

## 2021-04-14 DIAGNOSIS — R0602 Shortness of breath: Secondary | ICD-10-CM | POA: Diagnosis not present

## 2021-04-14 DIAGNOSIS — N183 Chronic kidney disease, stage 3 unspecified: Secondary | ICD-10-CM | POA: Diagnosis not present

## 2021-04-14 DIAGNOSIS — N184 Chronic kidney disease, stage 4 (severe): Secondary | ICD-10-CM | POA: Diagnosis not present

## 2021-04-15 DIAGNOSIS — N184 Chronic kidney disease, stage 4 (severe): Secondary | ICD-10-CM | POA: Diagnosis not present

## 2021-04-15 DIAGNOSIS — N183 Chronic kidney disease, stage 3 unspecified: Secondary | ICD-10-CM | POA: Diagnosis not present

## 2021-04-15 DIAGNOSIS — R0602 Shortness of breath: Secondary | ICD-10-CM | POA: Diagnosis not present

## 2021-04-15 DIAGNOSIS — I639 Cerebral infarction, unspecified: Secondary | ICD-10-CM | POA: Diagnosis not present

## 2021-04-15 DIAGNOSIS — I5043 Acute on chronic combined systolic (congestive) and diastolic (congestive) heart failure: Secondary | ICD-10-CM | POA: Diagnosis not present

## 2021-04-15 DIAGNOSIS — I69354 Hemiplegia and hemiparesis following cerebral infarction affecting left non-dominant side: Secondary | ICD-10-CM | POA: Diagnosis not present

## 2021-04-15 DIAGNOSIS — R2681 Unsteadiness on feet: Secondary | ICD-10-CM | POA: Diagnosis not present

## 2021-04-15 DIAGNOSIS — M6281 Muscle weakness (generalized): Secondary | ICD-10-CM | POA: Diagnosis not present

## 2021-04-15 DIAGNOSIS — R2689 Other abnormalities of gait and mobility: Secondary | ICD-10-CM | POA: Diagnosis not present

## 2021-04-17 DIAGNOSIS — I639 Cerebral infarction, unspecified: Secondary | ICD-10-CM | POA: Diagnosis not present

## 2021-04-17 DIAGNOSIS — R2681 Unsteadiness on feet: Secondary | ICD-10-CM | POA: Diagnosis not present

## 2021-04-17 DIAGNOSIS — I69354 Hemiplegia and hemiparesis following cerebral infarction affecting left non-dominant side: Secondary | ICD-10-CM | POA: Diagnosis not present

## 2021-04-17 DIAGNOSIS — N183 Chronic kidney disease, stage 3 unspecified: Secondary | ICD-10-CM | POA: Diagnosis not present

## 2021-04-17 DIAGNOSIS — R0602 Shortness of breath: Secondary | ICD-10-CM | POA: Diagnosis not present

## 2021-04-17 DIAGNOSIS — N184 Chronic kidney disease, stage 4 (severe): Secondary | ICD-10-CM | POA: Diagnosis not present

## 2021-04-17 DIAGNOSIS — I5043 Acute on chronic combined systolic (congestive) and diastolic (congestive) heart failure: Secondary | ICD-10-CM | POA: Diagnosis not present

## 2021-04-17 DIAGNOSIS — R2689 Other abnormalities of gait and mobility: Secondary | ICD-10-CM | POA: Diagnosis not present

## 2021-04-17 DIAGNOSIS — M6281 Muscle weakness (generalized): Secondary | ICD-10-CM | POA: Diagnosis not present

## 2021-04-18 DIAGNOSIS — I5043 Acute on chronic combined systolic (congestive) and diastolic (congestive) heart failure: Secondary | ICD-10-CM | POA: Diagnosis not present

## 2021-04-18 DIAGNOSIS — R0602 Shortness of breath: Secondary | ICD-10-CM | POA: Diagnosis not present

## 2021-04-18 DIAGNOSIS — I639 Cerebral infarction, unspecified: Secondary | ICD-10-CM | POA: Diagnosis not present

## 2021-04-18 DIAGNOSIS — R2681 Unsteadiness on feet: Secondary | ICD-10-CM | POA: Diagnosis not present

## 2021-04-18 DIAGNOSIS — N184 Chronic kidney disease, stage 4 (severe): Secondary | ICD-10-CM | POA: Diagnosis not present

## 2021-04-18 DIAGNOSIS — M6281 Muscle weakness (generalized): Secondary | ICD-10-CM | POA: Diagnosis not present

## 2021-04-18 DIAGNOSIS — R2689 Other abnormalities of gait and mobility: Secondary | ICD-10-CM | POA: Diagnosis not present

## 2021-04-18 DIAGNOSIS — N183 Chronic kidney disease, stage 3 unspecified: Secondary | ICD-10-CM | POA: Diagnosis not present

## 2021-04-18 DIAGNOSIS — I69354 Hemiplegia and hemiparesis following cerebral infarction affecting left non-dominant side: Secondary | ICD-10-CM | POA: Diagnosis not present

## 2021-04-19 DIAGNOSIS — Z23 Encounter for immunization: Secondary | ICD-10-CM | POA: Diagnosis not present

## 2021-04-19 DIAGNOSIS — I5043 Acute on chronic combined systolic (congestive) and diastolic (congestive) heart failure: Secondary | ICD-10-CM | POA: Diagnosis not present

## 2021-04-19 DIAGNOSIS — R2681 Unsteadiness on feet: Secondary | ICD-10-CM | POA: Diagnosis not present

## 2021-04-19 DIAGNOSIS — I69354 Hemiplegia and hemiparesis following cerebral infarction affecting left non-dominant side: Secondary | ICD-10-CM | POA: Diagnosis not present

## 2021-04-19 DIAGNOSIS — N184 Chronic kidney disease, stage 4 (severe): Secondary | ICD-10-CM | POA: Diagnosis not present

## 2021-04-19 DIAGNOSIS — I639 Cerebral infarction, unspecified: Secondary | ICD-10-CM | POA: Diagnosis not present

## 2021-04-19 DIAGNOSIS — N183 Chronic kidney disease, stage 3 unspecified: Secondary | ICD-10-CM | POA: Diagnosis not present

## 2021-04-19 DIAGNOSIS — R0602 Shortness of breath: Secondary | ICD-10-CM | POA: Diagnosis not present

## 2021-04-19 DIAGNOSIS — R2689 Other abnormalities of gait and mobility: Secondary | ICD-10-CM | POA: Diagnosis not present

## 2021-04-19 DIAGNOSIS — M6281 Muscle weakness (generalized): Secondary | ICD-10-CM | POA: Diagnosis not present

## 2021-04-20 DIAGNOSIS — R2681 Unsteadiness on feet: Secondary | ICD-10-CM | POA: Diagnosis not present

## 2021-04-20 DIAGNOSIS — I5043 Acute on chronic combined systolic (congestive) and diastolic (congestive) heart failure: Secondary | ICD-10-CM | POA: Diagnosis not present

## 2021-04-20 DIAGNOSIS — M6281 Muscle weakness (generalized): Secondary | ICD-10-CM | POA: Diagnosis not present

## 2021-04-20 DIAGNOSIS — N183 Chronic kidney disease, stage 3 unspecified: Secondary | ICD-10-CM | POA: Diagnosis not present

## 2021-04-20 DIAGNOSIS — R2689 Other abnormalities of gait and mobility: Secondary | ICD-10-CM | POA: Diagnosis not present

## 2021-04-20 DIAGNOSIS — I639 Cerebral infarction, unspecified: Secondary | ICD-10-CM | POA: Diagnosis not present

## 2021-04-20 DIAGNOSIS — N184 Chronic kidney disease, stage 4 (severe): Secondary | ICD-10-CM | POA: Diagnosis not present

## 2021-04-20 DIAGNOSIS — I69354 Hemiplegia and hemiparesis following cerebral infarction affecting left non-dominant side: Secondary | ICD-10-CM | POA: Diagnosis not present

## 2021-04-20 DIAGNOSIS — R0602 Shortness of breath: Secondary | ICD-10-CM | POA: Diagnosis not present

## 2021-04-21 DIAGNOSIS — I639 Cerebral infarction, unspecified: Secondary | ICD-10-CM | POA: Diagnosis not present

## 2021-04-21 DIAGNOSIS — R2681 Unsteadiness on feet: Secondary | ICD-10-CM | POA: Diagnosis not present

## 2021-04-21 DIAGNOSIS — M6281 Muscle weakness (generalized): Secondary | ICD-10-CM | POA: Diagnosis not present

## 2021-04-21 DIAGNOSIS — R0602 Shortness of breath: Secondary | ICD-10-CM | POA: Diagnosis not present

## 2021-04-21 DIAGNOSIS — N183 Chronic kidney disease, stage 3 unspecified: Secondary | ICD-10-CM | POA: Diagnosis not present

## 2021-04-21 DIAGNOSIS — I69354 Hemiplegia and hemiparesis following cerebral infarction affecting left non-dominant side: Secondary | ICD-10-CM | POA: Diagnosis not present

## 2021-04-21 DIAGNOSIS — N184 Chronic kidney disease, stage 4 (severe): Secondary | ICD-10-CM | POA: Diagnosis not present

## 2021-04-21 DIAGNOSIS — I5043 Acute on chronic combined systolic (congestive) and diastolic (congestive) heart failure: Secondary | ICD-10-CM | POA: Diagnosis not present

## 2021-04-21 DIAGNOSIS — R2689 Other abnormalities of gait and mobility: Secondary | ICD-10-CM | POA: Diagnosis not present

## 2021-04-24 DIAGNOSIS — R2689 Other abnormalities of gait and mobility: Secondary | ICD-10-CM | POA: Diagnosis not present

## 2021-04-24 DIAGNOSIS — M6281 Muscle weakness (generalized): Secondary | ICD-10-CM | POA: Diagnosis not present

## 2021-04-24 DIAGNOSIS — I5043 Acute on chronic combined systolic (congestive) and diastolic (congestive) heart failure: Secondary | ICD-10-CM | POA: Diagnosis not present

## 2021-04-24 DIAGNOSIS — R2681 Unsteadiness on feet: Secondary | ICD-10-CM | POA: Diagnosis not present

## 2021-04-24 DIAGNOSIS — N183 Chronic kidney disease, stage 3 unspecified: Secondary | ICD-10-CM | POA: Diagnosis not present

## 2021-04-24 DIAGNOSIS — I639 Cerebral infarction, unspecified: Secondary | ICD-10-CM | POA: Diagnosis not present

## 2021-04-24 DIAGNOSIS — R0602 Shortness of breath: Secondary | ICD-10-CM | POA: Diagnosis not present

## 2021-04-24 DIAGNOSIS — F330 Major depressive disorder, recurrent, mild: Secondary | ICD-10-CM | POA: Diagnosis not present

## 2021-04-24 DIAGNOSIS — N184 Chronic kidney disease, stage 4 (severe): Secondary | ICD-10-CM | POA: Diagnosis not present

## 2021-04-24 DIAGNOSIS — F0390 Unspecified dementia without behavioral disturbance: Secondary | ICD-10-CM | POA: Diagnosis not present

## 2021-04-24 DIAGNOSIS — I69354 Hemiplegia and hemiparesis following cerebral infarction affecting left non-dominant side: Secondary | ICD-10-CM | POA: Diagnosis not present

## 2021-04-25 DIAGNOSIS — I639 Cerebral infarction, unspecified: Secondary | ICD-10-CM | POA: Diagnosis not present

## 2021-04-25 DIAGNOSIS — M6281 Muscle weakness (generalized): Secondary | ICD-10-CM | POA: Diagnosis not present

## 2021-04-25 DIAGNOSIS — I69354 Hemiplegia and hemiparesis following cerebral infarction affecting left non-dominant side: Secondary | ICD-10-CM | POA: Diagnosis not present

## 2021-04-25 DIAGNOSIS — R0602 Shortness of breath: Secondary | ICD-10-CM | POA: Diagnosis not present

## 2021-04-25 DIAGNOSIS — G894 Chronic pain syndrome: Secondary | ICD-10-CM | POA: Diagnosis not present

## 2021-04-25 DIAGNOSIS — R2681 Unsteadiness on feet: Secondary | ICD-10-CM | POA: Diagnosis not present

## 2021-04-25 DIAGNOSIS — N183 Chronic kidney disease, stage 3 unspecified: Secondary | ICD-10-CM | POA: Diagnosis not present

## 2021-04-25 DIAGNOSIS — R296 Repeated falls: Secondary | ICD-10-CM | POA: Diagnosis not present

## 2021-04-25 DIAGNOSIS — N184 Chronic kidney disease, stage 4 (severe): Secondary | ICD-10-CM | POA: Diagnosis not present

## 2021-04-25 DIAGNOSIS — I5043 Acute on chronic combined systolic (congestive) and diastolic (congestive) heart failure: Secondary | ICD-10-CM | POA: Diagnosis not present

## 2021-04-25 DIAGNOSIS — R2689 Other abnormalities of gait and mobility: Secondary | ICD-10-CM | POA: Diagnosis not present

## 2021-04-26 DIAGNOSIS — N184 Chronic kidney disease, stage 4 (severe): Secondary | ICD-10-CM | POA: Diagnosis not present

## 2021-04-26 DIAGNOSIS — R2689 Other abnormalities of gait and mobility: Secondary | ICD-10-CM | POA: Diagnosis not present

## 2021-04-26 DIAGNOSIS — I639 Cerebral infarction, unspecified: Secondary | ICD-10-CM | POA: Diagnosis not present

## 2021-04-26 DIAGNOSIS — M6281 Muscle weakness (generalized): Secondary | ICD-10-CM | POA: Diagnosis not present

## 2021-04-26 DIAGNOSIS — N183 Chronic kidney disease, stage 3 unspecified: Secondary | ICD-10-CM | POA: Diagnosis not present

## 2021-04-26 DIAGNOSIS — R0602 Shortness of breath: Secondary | ICD-10-CM | POA: Diagnosis not present

## 2021-04-26 DIAGNOSIS — I5043 Acute on chronic combined systolic (congestive) and diastolic (congestive) heart failure: Secondary | ICD-10-CM | POA: Diagnosis not present

## 2021-04-26 DIAGNOSIS — I69354 Hemiplegia and hemiparesis following cerebral infarction affecting left non-dominant side: Secondary | ICD-10-CM | POA: Diagnosis not present

## 2021-04-26 DIAGNOSIS — R2681 Unsteadiness on feet: Secondary | ICD-10-CM | POA: Diagnosis not present

## 2021-04-27 DIAGNOSIS — M6281 Muscle weakness (generalized): Secondary | ICD-10-CM | POA: Diagnosis not present

## 2021-04-27 DIAGNOSIS — N183 Chronic kidney disease, stage 3 unspecified: Secondary | ICD-10-CM | POA: Diagnosis not present

## 2021-04-27 DIAGNOSIS — R0602 Shortness of breath: Secondary | ICD-10-CM | POA: Diagnosis not present

## 2021-04-27 DIAGNOSIS — R2689 Other abnormalities of gait and mobility: Secondary | ICD-10-CM | POA: Diagnosis not present

## 2021-04-27 DIAGNOSIS — I639 Cerebral infarction, unspecified: Secondary | ICD-10-CM | POA: Diagnosis not present

## 2021-04-27 DIAGNOSIS — N184 Chronic kidney disease, stage 4 (severe): Secondary | ICD-10-CM | POA: Diagnosis not present

## 2021-04-27 DIAGNOSIS — I5043 Acute on chronic combined systolic (congestive) and diastolic (congestive) heart failure: Secondary | ICD-10-CM | POA: Diagnosis not present

## 2021-04-27 DIAGNOSIS — R2681 Unsteadiness on feet: Secondary | ICD-10-CM | POA: Diagnosis not present

## 2021-04-27 DIAGNOSIS — I69354 Hemiplegia and hemiparesis following cerebral infarction affecting left non-dominant side: Secondary | ICD-10-CM | POA: Diagnosis not present

## 2021-04-28 DIAGNOSIS — I639 Cerebral infarction, unspecified: Secondary | ICD-10-CM | POA: Diagnosis not present

## 2021-04-28 DIAGNOSIS — I69354 Hemiplegia and hemiparesis following cerebral infarction affecting left non-dominant side: Secondary | ICD-10-CM | POA: Diagnosis not present

## 2021-04-28 DIAGNOSIS — R0602 Shortness of breath: Secondary | ICD-10-CM | POA: Diagnosis not present

## 2021-04-28 DIAGNOSIS — R2689 Other abnormalities of gait and mobility: Secondary | ICD-10-CM | POA: Diagnosis not present

## 2021-04-28 DIAGNOSIS — I5043 Acute on chronic combined systolic (congestive) and diastolic (congestive) heart failure: Secondary | ICD-10-CM | POA: Diagnosis not present

## 2021-04-28 DIAGNOSIS — N183 Chronic kidney disease, stage 3 unspecified: Secondary | ICD-10-CM | POA: Diagnosis not present

## 2021-04-28 DIAGNOSIS — M6281 Muscle weakness (generalized): Secondary | ICD-10-CM | POA: Diagnosis not present

## 2021-04-28 DIAGNOSIS — R2681 Unsteadiness on feet: Secondary | ICD-10-CM | POA: Diagnosis not present

## 2021-04-28 DIAGNOSIS — N184 Chronic kidney disease, stage 4 (severe): Secondary | ICD-10-CM | POA: Diagnosis not present

## 2021-05-01 DIAGNOSIS — N184 Chronic kidney disease, stage 4 (severe): Secondary | ICD-10-CM | POA: Diagnosis not present

## 2021-05-01 DIAGNOSIS — R2689 Other abnormalities of gait and mobility: Secondary | ICD-10-CM | POA: Diagnosis not present

## 2021-05-01 DIAGNOSIS — I639 Cerebral infarction, unspecified: Secondary | ICD-10-CM | POA: Diagnosis not present

## 2021-05-01 DIAGNOSIS — M6281 Muscle weakness (generalized): Secondary | ICD-10-CM | POA: Diagnosis not present

## 2021-05-01 DIAGNOSIS — N183 Chronic kidney disease, stage 3 unspecified: Secondary | ICD-10-CM | POA: Diagnosis not present

## 2021-05-01 DIAGNOSIS — I69354 Hemiplegia and hemiparesis following cerebral infarction affecting left non-dominant side: Secondary | ICD-10-CM | POA: Diagnosis not present

## 2021-05-01 DIAGNOSIS — R2681 Unsteadiness on feet: Secondary | ICD-10-CM | POA: Diagnosis not present

## 2021-05-01 DIAGNOSIS — R0602 Shortness of breath: Secondary | ICD-10-CM | POA: Diagnosis not present

## 2021-05-01 DIAGNOSIS — I5043 Acute on chronic combined systolic (congestive) and diastolic (congestive) heart failure: Secondary | ICD-10-CM | POA: Diagnosis not present

## 2021-05-02 DIAGNOSIS — I639 Cerebral infarction, unspecified: Secondary | ICD-10-CM | POA: Diagnosis not present

## 2021-05-02 DIAGNOSIS — I5043 Acute on chronic combined systolic (congestive) and diastolic (congestive) heart failure: Secondary | ICD-10-CM | POA: Diagnosis not present

## 2021-05-02 DIAGNOSIS — R0602 Shortness of breath: Secondary | ICD-10-CM | POA: Diagnosis not present

## 2021-05-02 DIAGNOSIS — N183 Chronic kidney disease, stage 3 unspecified: Secondary | ICD-10-CM | POA: Diagnosis not present

## 2021-05-02 DIAGNOSIS — N184 Chronic kidney disease, stage 4 (severe): Secondary | ICD-10-CM | POA: Diagnosis not present

## 2021-05-02 DIAGNOSIS — I69354 Hemiplegia and hemiparesis following cerebral infarction affecting left non-dominant side: Secondary | ICD-10-CM | POA: Diagnosis not present

## 2021-05-02 DIAGNOSIS — R2681 Unsteadiness on feet: Secondary | ICD-10-CM | POA: Diagnosis not present

## 2021-05-02 DIAGNOSIS — R2689 Other abnormalities of gait and mobility: Secondary | ICD-10-CM | POA: Diagnosis not present

## 2021-05-02 DIAGNOSIS — M6281 Muscle weakness (generalized): Secondary | ICD-10-CM | POA: Diagnosis not present

## 2021-05-04 DIAGNOSIS — R0602 Shortness of breath: Secondary | ICD-10-CM | POA: Diagnosis not present

## 2021-05-04 DIAGNOSIS — N183 Chronic kidney disease, stage 3 unspecified: Secondary | ICD-10-CM | POA: Diagnosis not present

## 2021-05-04 DIAGNOSIS — N184 Chronic kidney disease, stage 4 (severe): Secondary | ICD-10-CM | POA: Diagnosis not present

## 2021-05-04 DIAGNOSIS — M6281 Muscle weakness (generalized): Secondary | ICD-10-CM | POA: Diagnosis not present

## 2021-05-04 DIAGNOSIS — R2681 Unsteadiness on feet: Secondary | ICD-10-CM | POA: Diagnosis not present

## 2021-05-04 DIAGNOSIS — I5043 Acute on chronic combined systolic (congestive) and diastolic (congestive) heart failure: Secondary | ICD-10-CM | POA: Diagnosis not present

## 2021-05-04 DIAGNOSIS — R2689 Other abnormalities of gait and mobility: Secondary | ICD-10-CM | POA: Diagnosis not present

## 2021-05-04 DIAGNOSIS — I69354 Hemiplegia and hemiparesis following cerebral infarction affecting left non-dominant side: Secondary | ICD-10-CM | POA: Diagnosis not present

## 2021-05-04 DIAGNOSIS — I639 Cerebral infarction, unspecified: Secondary | ICD-10-CM | POA: Diagnosis not present

## 2021-05-05 DIAGNOSIS — M6281 Muscle weakness (generalized): Secondary | ICD-10-CM | POA: Diagnosis not present

## 2021-05-05 DIAGNOSIS — J449 Chronic obstructive pulmonary disease, unspecified: Secondary | ICD-10-CM | POA: Diagnosis not present

## 2021-05-05 DIAGNOSIS — I5043 Acute on chronic combined systolic (congestive) and diastolic (congestive) heart failure: Secondary | ICD-10-CM | POA: Diagnosis not present

## 2021-05-05 DIAGNOSIS — R0602 Shortness of breath: Secondary | ICD-10-CM | POA: Diagnosis not present

## 2021-05-05 DIAGNOSIS — R2689 Other abnormalities of gait and mobility: Secondary | ICD-10-CM | POA: Diagnosis not present

## 2021-05-05 DIAGNOSIS — I10 Essential (primary) hypertension: Secondary | ICD-10-CM | POA: Diagnosis not present

## 2021-05-05 DIAGNOSIS — I5022 Chronic systolic (congestive) heart failure: Secondary | ICD-10-CM | POA: Diagnosis not present

## 2021-05-05 DIAGNOSIS — N183 Chronic kidney disease, stage 3 unspecified: Secondary | ICD-10-CM | POA: Diagnosis not present

## 2021-05-05 DIAGNOSIS — I639 Cerebral infarction, unspecified: Secondary | ICD-10-CM | POA: Diagnosis not present

## 2021-05-05 DIAGNOSIS — R2681 Unsteadiness on feet: Secondary | ICD-10-CM | POA: Diagnosis not present

## 2021-05-05 DIAGNOSIS — I480 Paroxysmal atrial fibrillation: Secondary | ICD-10-CM | POA: Diagnosis not present

## 2021-05-05 DIAGNOSIS — N184 Chronic kidney disease, stage 4 (severe): Secondary | ICD-10-CM | POA: Diagnosis not present

## 2021-05-05 DIAGNOSIS — I69354 Hemiplegia and hemiparesis following cerebral infarction affecting left non-dominant side: Secondary | ICD-10-CM | POA: Diagnosis not present

## 2021-05-05 DIAGNOSIS — M328 Other forms of systemic lupus erythematosus: Secondary | ICD-10-CM | POA: Diagnosis not present

## 2021-05-05 DIAGNOSIS — E782 Mixed hyperlipidemia: Secondary | ICD-10-CM | POA: Diagnosis not present

## 2021-05-05 DIAGNOSIS — I69854 Hemiplegia and hemiparesis following other cerebrovascular disease affecting left non-dominant side: Secondary | ICD-10-CM | POA: Diagnosis not present

## 2021-05-06 DIAGNOSIS — N183 Chronic kidney disease, stage 3 unspecified: Secondary | ICD-10-CM | POA: Diagnosis not present

## 2021-05-06 DIAGNOSIS — I69354 Hemiplegia and hemiparesis following cerebral infarction affecting left non-dominant side: Secondary | ICD-10-CM | POA: Diagnosis not present

## 2021-05-06 DIAGNOSIS — R2689 Other abnormalities of gait and mobility: Secondary | ICD-10-CM | POA: Diagnosis not present

## 2021-05-06 DIAGNOSIS — R0602 Shortness of breath: Secondary | ICD-10-CM | POA: Diagnosis not present

## 2021-05-06 DIAGNOSIS — I5043 Acute on chronic combined systolic (congestive) and diastolic (congestive) heart failure: Secondary | ICD-10-CM | POA: Diagnosis not present

## 2021-05-06 DIAGNOSIS — R2681 Unsteadiness on feet: Secondary | ICD-10-CM | POA: Diagnosis not present

## 2021-05-06 DIAGNOSIS — N184 Chronic kidney disease, stage 4 (severe): Secondary | ICD-10-CM | POA: Diagnosis not present

## 2021-05-06 DIAGNOSIS — I639 Cerebral infarction, unspecified: Secondary | ICD-10-CM | POA: Diagnosis not present

## 2021-05-06 DIAGNOSIS — M6281 Muscle weakness (generalized): Secondary | ICD-10-CM | POA: Diagnosis not present

## 2021-05-08 DIAGNOSIS — R2681 Unsteadiness on feet: Secondary | ICD-10-CM | POA: Diagnosis not present

## 2021-05-08 DIAGNOSIS — I639 Cerebral infarction, unspecified: Secondary | ICD-10-CM | POA: Diagnosis not present

## 2021-05-08 DIAGNOSIS — N183 Chronic kidney disease, stage 3 unspecified: Secondary | ICD-10-CM | POA: Diagnosis not present

## 2021-05-08 DIAGNOSIS — N184 Chronic kidney disease, stage 4 (severe): Secondary | ICD-10-CM | POA: Diagnosis not present

## 2021-05-08 DIAGNOSIS — I69354 Hemiplegia and hemiparesis following cerebral infarction affecting left non-dominant side: Secondary | ICD-10-CM | POA: Diagnosis not present

## 2021-05-08 DIAGNOSIS — I5043 Acute on chronic combined systolic (congestive) and diastolic (congestive) heart failure: Secondary | ICD-10-CM | POA: Diagnosis not present

## 2021-05-08 DIAGNOSIS — R0602 Shortness of breath: Secondary | ICD-10-CM | POA: Diagnosis not present

## 2021-05-08 DIAGNOSIS — R2689 Other abnormalities of gait and mobility: Secondary | ICD-10-CM | POA: Diagnosis not present

## 2021-05-08 DIAGNOSIS — M6281 Muscle weakness (generalized): Secondary | ICD-10-CM | POA: Diagnosis not present

## 2021-05-09 DIAGNOSIS — G894 Chronic pain syndrome: Secondary | ICD-10-CM | POA: Diagnosis not present

## 2021-05-09 DIAGNOSIS — I69354 Hemiplegia and hemiparesis following cerebral infarction affecting left non-dominant side: Secondary | ICD-10-CM | POA: Diagnosis not present

## 2021-05-09 DIAGNOSIS — R278 Other lack of coordination: Secondary | ICD-10-CM | POA: Diagnosis not present

## 2021-05-09 DIAGNOSIS — I639 Cerebral infarction, unspecified: Secondary | ICD-10-CM | POA: Diagnosis not present

## 2021-05-09 DIAGNOSIS — R2681 Unsteadiness on feet: Secondary | ICD-10-CM | POA: Diagnosis not present

## 2021-05-09 DIAGNOSIS — R296 Repeated falls: Secondary | ICD-10-CM | POA: Diagnosis not present

## 2021-05-09 DIAGNOSIS — N183 Chronic kidney disease, stage 3 unspecified: Secondary | ICD-10-CM | POA: Diagnosis not present

## 2021-05-09 DIAGNOSIS — N184 Chronic kidney disease, stage 4 (severe): Secondary | ICD-10-CM | POA: Diagnosis not present

## 2021-05-09 DIAGNOSIS — M6281 Muscle weakness (generalized): Secondary | ICD-10-CM | POA: Diagnosis not present

## 2021-05-09 DIAGNOSIS — J449 Chronic obstructive pulmonary disease, unspecified: Secondary | ICD-10-CM | POA: Diagnosis not present

## 2021-05-09 DIAGNOSIS — R2689 Other abnormalities of gait and mobility: Secondary | ICD-10-CM | POA: Diagnosis not present

## 2021-05-09 DIAGNOSIS — I5043 Acute on chronic combined systolic (congestive) and diastolic (congestive) heart failure: Secondary | ICD-10-CM | POA: Diagnosis not present

## 2021-05-09 DIAGNOSIS — R0602 Shortness of breath: Secondary | ICD-10-CM | POA: Diagnosis not present

## 2021-05-10 DIAGNOSIS — M6281 Muscle weakness (generalized): Secondary | ICD-10-CM | POA: Diagnosis not present

## 2021-05-10 DIAGNOSIS — I779 Disorder of arteries and arterioles, unspecified: Secondary | ICD-10-CM | POA: Diagnosis not present

## 2021-05-10 DIAGNOSIS — I480 Paroxysmal atrial fibrillation: Secondary | ICD-10-CM | POA: Diagnosis not present

## 2021-05-10 DIAGNOSIS — R278 Other lack of coordination: Secondary | ICD-10-CM | POA: Diagnosis not present

## 2021-05-10 DIAGNOSIS — J449 Chronic obstructive pulmonary disease, unspecified: Secondary | ICD-10-CM | POA: Diagnosis not present

## 2021-05-10 DIAGNOSIS — I739 Peripheral vascular disease, unspecified: Secondary | ICD-10-CM | POA: Diagnosis not present

## 2021-05-10 DIAGNOSIS — I69354 Hemiplegia and hemiparesis following cerebral infarction affecting left non-dominant side: Secondary | ICD-10-CM | POA: Diagnosis not present

## 2021-05-10 DIAGNOSIS — N183 Chronic kidney disease, stage 3 unspecified: Secondary | ICD-10-CM | POA: Diagnosis not present

## 2021-05-10 DIAGNOSIS — I5042 Chronic combined systolic (congestive) and diastolic (congestive) heart failure: Secondary | ICD-10-CM | POA: Diagnosis not present

## 2021-05-10 DIAGNOSIS — F0150 Vascular dementia without behavioral disturbance: Secondary | ICD-10-CM | POA: Diagnosis not present

## 2021-05-10 DIAGNOSIS — M329 Systemic lupus erythematosus, unspecified: Secondary | ICD-10-CM | POA: Diagnosis not present

## 2021-05-10 DIAGNOSIS — R296 Repeated falls: Secondary | ICD-10-CM | POA: Diagnosis not present

## 2021-05-10 DIAGNOSIS — G894 Chronic pain syndrome: Secondary | ICD-10-CM | POA: Diagnosis not present

## 2021-05-10 DIAGNOSIS — I69359 Hemiplegia and hemiparesis following cerebral infarction affecting unspecified side: Secondary | ICD-10-CM | POA: Diagnosis not present

## 2021-05-11 DIAGNOSIS — M6281 Muscle weakness (generalized): Secondary | ICD-10-CM | POA: Diagnosis not present

## 2021-05-11 DIAGNOSIS — G894 Chronic pain syndrome: Secondary | ICD-10-CM | POA: Diagnosis not present

## 2021-05-11 DIAGNOSIS — I69354 Hemiplegia and hemiparesis following cerebral infarction affecting left non-dominant side: Secondary | ICD-10-CM | POA: Diagnosis not present

## 2021-05-11 DIAGNOSIS — R278 Other lack of coordination: Secondary | ICD-10-CM | POA: Diagnosis not present

## 2021-05-11 DIAGNOSIS — J449 Chronic obstructive pulmonary disease, unspecified: Secondary | ICD-10-CM | POA: Diagnosis not present

## 2021-05-11 DIAGNOSIS — R296 Repeated falls: Secondary | ICD-10-CM | POA: Diagnosis not present

## 2021-05-12 DIAGNOSIS — G894 Chronic pain syndrome: Secondary | ICD-10-CM | POA: Diagnosis not present

## 2021-05-12 DIAGNOSIS — E785 Hyperlipidemia, unspecified: Secondary | ICD-10-CM | POA: Diagnosis not present

## 2021-05-12 DIAGNOSIS — R278 Other lack of coordination: Secondary | ICD-10-CM | POA: Diagnosis not present

## 2021-05-12 DIAGNOSIS — D649 Anemia, unspecified: Secondary | ICD-10-CM | POA: Diagnosis not present

## 2021-05-12 DIAGNOSIS — R296 Repeated falls: Secondary | ICD-10-CM | POA: Diagnosis not present

## 2021-05-12 DIAGNOSIS — E440 Moderate protein-calorie malnutrition: Secondary | ICD-10-CM | POA: Diagnosis not present

## 2021-05-12 DIAGNOSIS — M6281 Muscle weakness (generalized): Secondary | ICD-10-CM | POA: Diagnosis not present

## 2021-05-12 DIAGNOSIS — I69354 Hemiplegia and hemiparesis following cerebral infarction affecting left non-dominant side: Secondary | ICD-10-CM | POA: Diagnosis not present

## 2021-05-12 DIAGNOSIS — J449 Chronic obstructive pulmonary disease, unspecified: Secondary | ICD-10-CM | POA: Diagnosis not present

## 2021-05-12 DIAGNOSIS — I10 Essential (primary) hypertension: Secondary | ICD-10-CM | POA: Diagnosis not present

## 2021-05-15 DIAGNOSIS — R296 Repeated falls: Secondary | ICD-10-CM | POA: Diagnosis not present

## 2021-05-15 DIAGNOSIS — R278 Other lack of coordination: Secondary | ICD-10-CM | POA: Diagnosis not present

## 2021-05-15 DIAGNOSIS — J449 Chronic obstructive pulmonary disease, unspecified: Secondary | ICD-10-CM | POA: Diagnosis not present

## 2021-05-15 DIAGNOSIS — G894 Chronic pain syndrome: Secondary | ICD-10-CM | POA: Diagnosis not present

## 2021-05-15 DIAGNOSIS — M6281 Muscle weakness (generalized): Secondary | ICD-10-CM | POA: Diagnosis not present

## 2021-05-15 DIAGNOSIS — I69354 Hemiplegia and hemiparesis following cerebral infarction affecting left non-dominant side: Secondary | ICD-10-CM | POA: Diagnosis not present

## 2021-05-16 DIAGNOSIS — J449 Chronic obstructive pulmonary disease, unspecified: Secondary | ICD-10-CM | POA: Diagnosis not present

## 2021-05-16 DIAGNOSIS — G894 Chronic pain syndrome: Secondary | ICD-10-CM | POA: Diagnosis not present

## 2021-05-16 DIAGNOSIS — I69354 Hemiplegia and hemiparesis following cerebral infarction affecting left non-dominant side: Secondary | ICD-10-CM | POA: Diagnosis not present

## 2021-05-16 DIAGNOSIS — R296 Repeated falls: Secondary | ICD-10-CM | POA: Diagnosis not present

## 2021-05-16 DIAGNOSIS — R278 Other lack of coordination: Secondary | ICD-10-CM | POA: Diagnosis not present

## 2021-05-16 DIAGNOSIS — M6281 Muscle weakness (generalized): Secondary | ICD-10-CM | POA: Diagnosis not present

## 2021-05-17 DIAGNOSIS — M6281 Muscle weakness (generalized): Secondary | ICD-10-CM | POA: Diagnosis not present

## 2021-05-17 DIAGNOSIS — R296 Repeated falls: Secondary | ICD-10-CM | POA: Diagnosis not present

## 2021-05-17 DIAGNOSIS — J449 Chronic obstructive pulmonary disease, unspecified: Secondary | ICD-10-CM | POA: Diagnosis not present

## 2021-05-17 DIAGNOSIS — R278 Other lack of coordination: Secondary | ICD-10-CM | POA: Diagnosis not present

## 2021-05-17 DIAGNOSIS — G894 Chronic pain syndrome: Secondary | ICD-10-CM | POA: Diagnosis not present

## 2021-05-17 DIAGNOSIS — I69354 Hemiplegia and hemiparesis following cerebral infarction affecting left non-dominant side: Secondary | ICD-10-CM | POA: Diagnosis not present

## 2021-05-18 DIAGNOSIS — I69354 Hemiplegia and hemiparesis following cerebral infarction affecting left non-dominant side: Secondary | ICD-10-CM | POA: Diagnosis not present

## 2021-05-18 DIAGNOSIS — G894 Chronic pain syndrome: Secondary | ICD-10-CM | POA: Diagnosis not present

## 2021-05-18 DIAGNOSIS — R278 Other lack of coordination: Secondary | ICD-10-CM | POA: Diagnosis not present

## 2021-05-18 DIAGNOSIS — R296 Repeated falls: Secondary | ICD-10-CM | POA: Diagnosis not present

## 2021-05-18 DIAGNOSIS — M6281 Muscle weakness (generalized): Secondary | ICD-10-CM | POA: Diagnosis not present

## 2021-05-18 DIAGNOSIS — J449 Chronic obstructive pulmonary disease, unspecified: Secondary | ICD-10-CM | POA: Diagnosis not present

## 2021-05-19 DIAGNOSIS — R296 Repeated falls: Secondary | ICD-10-CM | POA: Diagnosis not present

## 2021-05-19 DIAGNOSIS — R278 Other lack of coordination: Secondary | ICD-10-CM | POA: Diagnosis not present

## 2021-05-19 DIAGNOSIS — J449 Chronic obstructive pulmonary disease, unspecified: Secondary | ICD-10-CM | POA: Diagnosis not present

## 2021-05-19 DIAGNOSIS — M6281 Muscle weakness (generalized): Secondary | ICD-10-CM | POA: Diagnosis not present

## 2021-05-19 DIAGNOSIS — I69354 Hemiplegia and hemiparesis following cerebral infarction affecting left non-dominant side: Secondary | ICD-10-CM | POA: Diagnosis not present

## 2021-05-19 DIAGNOSIS — G894 Chronic pain syndrome: Secondary | ICD-10-CM | POA: Diagnosis not present

## 2021-05-20 DIAGNOSIS — M6281 Muscle weakness (generalized): Secondary | ICD-10-CM | POA: Diagnosis not present

## 2021-05-20 DIAGNOSIS — G894 Chronic pain syndrome: Secondary | ICD-10-CM | POA: Diagnosis not present

## 2021-05-20 DIAGNOSIS — J449 Chronic obstructive pulmonary disease, unspecified: Secondary | ICD-10-CM | POA: Diagnosis not present

## 2021-05-20 DIAGNOSIS — R296 Repeated falls: Secondary | ICD-10-CM | POA: Diagnosis not present

## 2021-05-20 DIAGNOSIS — I69354 Hemiplegia and hemiparesis following cerebral infarction affecting left non-dominant side: Secondary | ICD-10-CM | POA: Diagnosis not present

## 2021-05-20 DIAGNOSIS — R278 Other lack of coordination: Secondary | ICD-10-CM | POA: Diagnosis not present

## 2021-05-21 DIAGNOSIS — J449 Chronic obstructive pulmonary disease, unspecified: Secondary | ICD-10-CM | POA: Diagnosis not present

## 2021-05-21 DIAGNOSIS — R296 Repeated falls: Secondary | ICD-10-CM | POA: Diagnosis not present

## 2021-05-21 DIAGNOSIS — M6281 Muscle weakness (generalized): Secondary | ICD-10-CM | POA: Diagnosis not present

## 2021-05-21 DIAGNOSIS — I69354 Hemiplegia and hemiparesis following cerebral infarction affecting left non-dominant side: Secondary | ICD-10-CM | POA: Diagnosis not present

## 2021-05-21 DIAGNOSIS — G894 Chronic pain syndrome: Secondary | ICD-10-CM | POA: Diagnosis not present

## 2021-05-21 DIAGNOSIS — R278 Other lack of coordination: Secondary | ICD-10-CM | POA: Diagnosis not present

## 2021-05-22 DIAGNOSIS — G894 Chronic pain syndrome: Secondary | ICD-10-CM | POA: Diagnosis not present

## 2021-05-22 DIAGNOSIS — R278 Other lack of coordination: Secondary | ICD-10-CM | POA: Diagnosis not present

## 2021-05-22 DIAGNOSIS — J449 Chronic obstructive pulmonary disease, unspecified: Secondary | ICD-10-CM | POA: Diagnosis not present

## 2021-05-22 DIAGNOSIS — R296 Repeated falls: Secondary | ICD-10-CM | POA: Diagnosis not present

## 2021-05-22 DIAGNOSIS — M6281 Muscle weakness (generalized): Secondary | ICD-10-CM | POA: Diagnosis not present

## 2021-05-22 DIAGNOSIS — I69354 Hemiplegia and hemiparesis following cerebral infarction affecting left non-dominant side: Secondary | ICD-10-CM | POA: Diagnosis not present

## 2021-05-23 DIAGNOSIS — R278 Other lack of coordination: Secondary | ICD-10-CM | POA: Diagnosis not present

## 2021-05-23 DIAGNOSIS — M6281 Muscle weakness (generalized): Secondary | ICD-10-CM | POA: Diagnosis not present

## 2021-05-23 DIAGNOSIS — J449 Chronic obstructive pulmonary disease, unspecified: Secondary | ICD-10-CM | POA: Diagnosis not present

## 2021-05-23 DIAGNOSIS — R296 Repeated falls: Secondary | ICD-10-CM | POA: Diagnosis not present

## 2021-05-23 DIAGNOSIS — G894 Chronic pain syndrome: Secondary | ICD-10-CM | POA: Diagnosis not present

## 2021-05-23 DIAGNOSIS — I69354 Hemiplegia and hemiparesis following cerebral infarction affecting left non-dominant side: Secondary | ICD-10-CM | POA: Diagnosis not present

## 2021-05-24 DIAGNOSIS — R278 Other lack of coordination: Secondary | ICD-10-CM | POA: Diagnosis not present

## 2021-05-24 DIAGNOSIS — G894 Chronic pain syndrome: Secondary | ICD-10-CM | POA: Diagnosis not present

## 2021-05-24 DIAGNOSIS — J449 Chronic obstructive pulmonary disease, unspecified: Secondary | ICD-10-CM | POA: Diagnosis not present

## 2021-05-24 DIAGNOSIS — I69354 Hemiplegia and hemiparesis following cerebral infarction affecting left non-dominant side: Secondary | ICD-10-CM | POA: Diagnosis not present

## 2021-05-24 DIAGNOSIS — M6281 Muscle weakness (generalized): Secondary | ICD-10-CM | POA: Diagnosis not present

## 2021-05-24 DIAGNOSIS — R296 Repeated falls: Secondary | ICD-10-CM | POA: Diagnosis not present

## 2021-05-25 DIAGNOSIS — M6281 Muscle weakness (generalized): Secondary | ICD-10-CM | POA: Diagnosis not present

## 2021-05-25 DIAGNOSIS — I69354 Hemiplegia and hemiparesis following cerebral infarction affecting left non-dominant side: Secondary | ICD-10-CM | POA: Diagnosis not present

## 2021-05-25 DIAGNOSIS — R296 Repeated falls: Secondary | ICD-10-CM | POA: Diagnosis not present

## 2021-05-25 DIAGNOSIS — J449 Chronic obstructive pulmonary disease, unspecified: Secondary | ICD-10-CM | POA: Diagnosis not present

## 2021-05-25 DIAGNOSIS — G894 Chronic pain syndrome: Secondary | ICD-10-CM | POA: Diagnosis not present

## 2021-05-25 DIAGNOSIS — R278 Other lack of coordination: Secondary | ICD-10-CM | POA: Diagnosis not present

## 2021-05-26 DIAGNOSIS — J449 Chronic obstructive pulmonary disease, unspecified: Secondary | ICD-10-CM | POA: Diagnosis not present

## 2021-05-26 DIAGNOSIS — M6281 Muscle weakness (generalized): Secondary | ICD-10-CM | POA: Diagnosis not present

## 2021-05-26 DIAGNOSIS — R296 Repeated falls: Secondary | ICD-10-CM | POA: Diagnosis not present

## 2021-05-26 DIAGNOSIS — R278 Other lack of coordination: Secondary | ICD-10-CM | POA: Diagnosis not present

## 2021-05-26 DIAGNOSIS — I69354 Hemiplegia and hemiparesis following cerebral infarction affecting left non-dominant side: Secondary | ICD-10-CM | POA: Diagnosis not present

## 2021-05-26 DIAGNOSIS — G894 Chronic pain syndrome: Secondary | ICD-10-CM | POA: Diagnosis not present

## 2021-05-29 DIAGNOSIS — R296 Repeated falls: Secondary | ICD-10-CM | POA: Diagnosis not present

## 2021-05-29 DIAGNOSIS — R278 Other lack of coordination: Secondary | ICD-10-CM | POA: Diagnosis not present

## 2021-05-29 DIAGNOSIS — G894 Chronic pain syndrome: Secondary | ICD-10-CM | POA: Diagnosis not present

## 2021-05-29 DIAGNOSIS — J449 Chronic obstructive pulmonary disease, unspecified: Secondary | ICD-10-CM | POA: Diagnosis not present

## 2021-05-29 DIAGNOSIS — I69354 Hemiplegia and hemiparesis following cerebral infarction affecting left non-dominant side: Secondary | ICD-10-CM | POA: Diagnosis not present

## 2021-05-29 DIAGNOSIS — M6281 Muscle weakness (generalized): Secondary | ICD-10-CM | POA: Diagnosis not present

## 2021-05-30 DIAGNOSIS — J449 Chronic obstructive pulmonary disease, unspecified: Secondary | ICD-10-CM | POA: Diagnosis not present

## 2021-05-30 DIAGNOSIS — R296 Repeated falls: Secondary | ICD-10-CM | POA: Diagnosis not present

## 2021-05-30 DIAGNOSIS — M6281 Muscle weakness (generalized): Secondary | ICD-10-CM | POA: Diagnosis not present

## 2021-05-30 DIAGNOSIS — I69354 Hemiplegia and hemiparesis following cerebral infarction affecting left non-dominant side: Secondary | ICD-10-CM | POA: Diagnosis not present

## 2021-05-30 DIAGNOSIS — G894 Chronic pain syndrome: Secondary | ICD-10-CM | POA: Diagnosis not present

## 2021-05-30 DIAGNOSIS — R278 Other lack of coordination: Secondary | ICD-10-CM | POA: Diagnosis not present

## 2021-05-31 DIAGNOSIS — G894 Chronic pain syndrome: Secondary | ICD-10-CM | POA: Diagnosis not present

## 2021-05-31 DIAGNOSIS — R296 Repeated falls: Secondary | ICD-10-CM | POA: Diagnosis not present

## 2021-05-31 DIAGNOSIS — M6281 Muscle weakness (generalized): Secondary | ICD-10-CM | POA: Diagnosis not present

## 2021-05-31 DIAGNOSIS — R278 Other lack of coordination: Secondary | ICD-10-CM | POA: Diagnosis not present

## 2021-05-31 DIAGNOSIS — I69354 Hemiplegia and hemiparesis following cerebral infarction affecting left non-dominant side: Secondary | ICD-10-CM | POA: Diagnosis not present

## 2021-05-31 DIAGNOSIS — J449 Chronic obstructive pulmonary disease, unspecified: Secondary | ICD-10-CM | POA: Diagnosis not present

## 2021-06-01 DIAGNOSIS — R278 Other lack of coordination: Secondary | ICD-10-CM | POA: Diagnosis not present

## 2021-06-01 DIAGNOSIS — R296 Repeated falls: Secondary | ICD-10-CM | POA: Diagnosis not present

## 2021-06-01 DIAGNOSIS — I69354 Hemiplegia and hemiparesis following cerebral infarction affecting left non-dominant side: Secondary | ICD-10-CM | POA: Diagnosis not present

## 2021-06-01 DIAGNOSIS — G894 Chronic pain syndrome: Secondary | ICD-10-CM | POA: Diagnosis not present

## 2021-06-01 DIAGNOSIS — M6281 Muscle weakness (generalized): Secondary | ICD-10-CM | POA: Diagnosis not present

## 2021-06-01 DIAGNOSIS — J449 Chronic obstructive pulmonary disease, unspecified: Secondary | ICD-10-CM | POA: Diagnosis not present

## 2021-06-02 DIAGNOSIS — G894 Chronic pain syndrome: Secondary | ICD-10-CM | POA: Diagnosis not present

## 2021-06-02 DIAGNOSIS — R296 Repeated falls: Secondary | ICD-10-CM | POA: Diagnosis not present

## 2021-06-02 DIAGNOSIS — R278 Other lack of coordination: Secondary | ICD-10-CM | POA: Diagnosis not present

## 2021-06-02 DIAGNOSIS — I69354 Hemiplegia and hemiparesis following cerebral infarction affecting left non-dominant side: Secondary | ICD-10-CM | POA: Diagnosis not present

## 2021-06-02 DIAGNOSIS — J449 Chronic obstructive pulmonary disease, unspecified: Secondary | ICD-10-CM | POA: Diagnosis not present

## 2021-06-02 DIAGNOSIS — M6281 Muscle weakness (generalized): Secondary | ICD-10-CM | POA: Diagnosis not present

## 2021-06-03 DIAGNOSIS — I69354 Hemiplegia and hemiparesis following cerebral infarction affecting left non-dominant side: Secondary | ICD-10-CM | POA: Diagnosis not present

## 2021-06-03 DIAGNOSIS — M6281 Muscle weakness (generalized): Secondary | ICD-10-CM | POA: Diagnosis not present

## 2021-06-03 DIAGNOSIS — R278 Other lack of coordination: Secondary | ICD-10-CM | POA: Diagnosis not present

## 2021-06-03 DIAGNOSIS — G894 Chronic pain syndrome: Secondary | ICD-10-CM | POA: Diagnosis not present

## 2021-06-03 DIAGNOSIS — J449 Chronic obstructive pulmonary disease, unspecified: Secondary | ICD-10-CM | POA: Diagnosis not present

## 2021-06-03 DIAGNOSIS — R296 Repeated falls: Secondary | ICD-10-CM | POA: Diagnosis not present

## 2021-06-05 DIAGNOSIS — M6281 Muscle weakness (generalized): Secondary | ICD-10-CM | POA: Diagnosis not present

## 2021-06-05 DIAGNOSIS — F17200 Nicotine dependence, unspecified, uncomplicated: Secondary | ICD-10-CM | POA: Diagnosis not present

## 2021-06-05 DIAGNOSIS — I69354 Hemiplegia and hemiparesis following cerebral infarction affecting left non-dominant side: Secondary | ICD-10-CM | POA: Diagnosis not present

## 2021-06-05 DIAGNOSIS — I69359 Hemiplegia and hemiparesis following cerebral infarction affecting unspecified side: Secondary | ICD-10-CM | POA: Diagnosis not present

## 2021-06-05 DIAGNOSIS — R296 Repeated falls: Secondary | ICD-10-CM | POA: Diagnosis not present

## 2021-06-05 DIAGNOSIS — I5042 Chronic combined systolic (congestive) and diastolic (congestive) heart failure: Secondary | ICD-10-CM | POA: Diagnosis not present

## 2021-06-05 DIAGNOSIS — R278 Other lack of coordination: Secondary | ICD-10-CM | POA: Diagnosis not present

## 2021-06-05 DIAGNOSIS — F3340 Major depressive disorder, recurrent, in remission, unspecified: Secondary | ICD-10-CM | POA: Diagnosis not present

## 2021-06-05 DIAGNOSIS — I480 Paroxysmal atrial fibrillation: Secondary | ICD-10-CM | POA: Diagnosis not present

## 2021-06-05 DIAGNOSIS — G894 Chronic pain syndrome: Secondary | ICD-10-CM | POA: Diagnosis not present

## 2021-06-05 DIAGNOSIS — I779 Disorder of arteries and arterioles, unspecified: Secondary | ICD-10-CM | POA: Diagnosis not present

## 2021-06-05 DIAGNOSIS — N183 Chronic kidney disease, stage 3 unspecified: Secondary | ICD-10-CM | POA: Diagnosis not present

## 2021-06-05 DIAGNOSIS — J449 Chronic obstructive pulmonary disease, unspecified: Secondary | ICD-10-CM | POA: Diagnosis not present

## 2021-06-06 DIAGNOSIS — R296 Repeated falls: Secondary | ICD-10-CM | POA: Diagnosis not present

## 2021-06-06 DIAGNOSIS — J449 Chronic obstructive pulmonary disease, unspecified: Secondary | ICD-10-CM | POA: Diagnosis not present

## 2021-06-06 DIAGNOSIS — M6281 Muscle weakness (generalized): Secondary | ICD-10-CM | POA: Diagnosis not present

## 2021-06-06 DIAGNOSIS — G894 Chronic pain syndrome: Secondary | ICD-10-CM | POA: Diagnosis not present

## 2021-06-06 DIAGNOSIS — I69354 Hemiplegia and hemiparesis following cerebral infarction affecting left non-dominant side: Secondary | ICD-10-CM | POA: Diagnosis not present

## 2021-06-06 DIAGNOSIS — R278 Other lack of coordination: Secondary | ICD-10-CM | POA: Diagnosis not present

## 2021-06-07 DIAGNOSIS — G894 Chronic pain syndrome: Secondary | ICD-10-CM | POA: Diagnosis not present

## 2021-06-07 DIAGNOSIS — J449 Chronic obstructive pulmonary disease, unspecified: Secondary | ICD-10-CM | POA: Diagnosis not present

## 2021-06-07 DIAGNOSIS — I69354 Hemiplegia and hemiparesis following cerebral infarction affecting left non-dominant side: Secondary | ICD-10-CM | POA: Diagnosis not present

## 2021-06-07 DIAGNOSIS — R296 Repeated falls: Secondary | ICD-10-CM | POA: Diagnosis not present

## 2021-06-07 DIAGNOSIS — M6281 Muscle weakness (generalized): Secondary | ICD-10-CM | POA: Diagnosis not present

## 2021-06-07 DIAGNOSIS — R278 Other lack of coordination: Secondary | ICD-10-CM | POA: Diagnosis not present

## 2021-06-08 DIAGNOSIS — J449 Chronic obstructive pulmonary disease, unspecified: Secondary | ICD-10-CM | POA: Diagnosis not present

## 2021-06-08 DIAGNOSIS — I69354 Hemiplegia and hemiparesis following cerebral infarction affecting left non-dominant side: Secondary | ICD-10-CM | POA: Diagnosis not present

## 2021-06-08 DIAGNOSIS — R278 Other lack of coordination: Secondary | ICD-10-CM | POA: Diagnosis not present

## 2021-06-08 DIAGNOSIS — R296 Repeated falls: Secondary | ICD-10-CM | POA: Diagnosis not present

## 2021-06-08 DIAGNOSIS — G894 Chronic pain syndrome: Secondary | ICD-10-CM | POA: Diagnosis not present

## 2021-06-08 DIAGNOSIS — M6281 Muscle weakness (generalized): Secondary | ICD-10-CM | POA: Diagnosis not present

## 2021-06-09 DIAGNOSIS — M6281 Muscle weakness (generalized): Secondary | ICD-10-CM | POA: Diagnosis not present

## 2021-06-09 DIAGNOSIS — R278 Other lack of coordination: Secondary | ICD-10-CM | POA: Diagnosis not present

## 2021-06-09 DIAGNOSIS — I69354 Hemiplegia and hemiparesis following cerebral infarction affecting left non-dominant side: Secondary | ICD-10-CM | POA: Diagnosis not present

## 2021-06-09 DIAGNOSIS — G894 Chronic pain syndrome: Secondary | ICD-10-CM | POA: Diagnosis not present

## 2021-06-09 DIAGNOSIS — J449 Chronic obstructive pulmonary disease, unspecified: Secondary | ICD-10-CM | POA: Diagnosis not present

## 2021-06-09 DIAGNOSIS — R296 Repeated falls: Secondary | ICD-10-CM | POA: Diagnosis not present

## 2021-06-13 DIAGNOSIS — R296 Repeated falls: Secondary | ICD-10-CM | POA: Diagnosis not present

## 2021-06-13 DIAGNOSIS — I69354 Hemiplegia and hemiparesis following cerebral infarction affecting left non-dominant side: Secondary | ICD-10-CM | POA: Diagnosis not present

## 2021-06-13 DIAGNOSIS — R278 Other lack of coordination: Secondary | ICD-10-CM | POA: Diagnosis not present

## 2021-06-13 DIAGNOSIS — M6281 Muscle weakness (generalized): Secondary | ICD-10-CM | POA: Diagnosis not present

## 2021-06-13 DIAGNOSIS — G894 Chronic pain syndrome: Secondary | ICD-10-CM | POA: Diagnosis not present

## 2021-06-13 DIAGNOSIS — J449 Chronic obstructive pulmonary disease, unspecified: Secondary | ICD-10-CM | POA: Diagnosis not present

## 2021-06-15 DIAGNOSIS — I69354 Hemiplegia and hemiparesis following cerebral infarction affecting left non-dominant side: Secondary | ICD-10-CM | POA: Diagnosis not present

## 2021-06-15 DIAGNOSIS — G894 Chronic pain syndrome: Secondary | ICD-10-CM | POA: Diagnosis not present

## 2021-06-15 DIAGNOSIS — R278 Other lack of coordination: Secondary | ICD-10-CM | POA: Diagnosis not present

## 2021-06-15 DIAGNOSIS — M6281 Muscle weakness (generalized): Secondary | ICD-10-CM | POA: Diagnosis not present

## 2021-06-15 DIAGNOSIS — R296 Repeated falls: Secondary | ICD-10-CM | POA: Diagnosis not present

## 2021-06-15 DIAGNOSIS — J449 Chronic obstructive pulmonary disease, unspecified: Secondary | ICD-10-CM | POA: Diagnosis not present

## 2021-06-16 DIAGNOSIS — G894 Chronic pain syndrome: Secondary | ICD-10-CM | POA: Diagnosis not present

## 2021-06-16 DIAGNOSIS — M6281 Muscle weakness (generalized): Secondary | ICD-10-CM | POA: Diagnosis not present

## 2021-06-16 DIAGNOSIS — I69354 Hemiplegia and hemiparesis following cerebral infarction affecting left non-dominant side: Secondary | ICD-10-CM | POA: Diagnosis not present

## 2021-06-16 DIAGNOSIS — J449 Chronic obstructive pulmonary disease, unspecified: Secondary | ICD-10-CM | POA: Diagnosis not present

## 2021-06-16 DIAGNOSIS — R278 Other lack of coordination: Secondary | ICD-10-CM | POA: Diagnosis not present

## 2021-06-16 DIAGNOSIS — R296 Repeated falls: Secondary | ICD-10-CM | POA: Diagnosis not present

## 2021-06-17 DIAGNOSIS — R278 Other lack of coordination: Secondary | ICD-10-CM | POA: Diagnosis not present

## 2021-06-17 DIAGNOSIS — I69354 Hemiplegia and hemiparesis following cerebral infarction affecting left non-dominant side: Secondary | ICD-10-CM | POA: Diagnosis not present

## 2021-06-17 DIAGNOSIS — J449 Chronic obstructive pulmonary disease, unspecified: Secondary | ICD-10-CM | POA: Diagnosis not present

## 2021-06-17 DIAGNOSIS — G894 Chronic pain syndrome: Secondary | ICD-10-CM | POA: Diagnosis not present

## 2021-06-17 DIAGNOSIS — M6281 Muscle weakness (generalized): Secondary | ICD-10-CM | POA: Diagnosis not present

## 2021-06-17 DIAGNOSIS — R296 Repeated falls: Secondary | ICD-10-CM | POA: Diagnosis not present

## 2021-06-19 DIAGNOSIS — R296 Repeated falls: Secondary | ICD-10-CM | POA: Diagnosis not present

## 2021-06-19 DIAGNOSIS — R278 Other lack of coordination: Secondary | ICD-10-CM | POA: Diagnosis not present

## 2021-06-19 DIAGNOSIS — M6281 Muscle weakness (generalized): Secondary | ICD-10-CM | POA: Diagnosis not present

## 2021-06-19 DIAGNOSIS — J449 Chronic obstructive pulmonary disease, unspecified: Secondary | ICD-10-CM | POA: Diagnosis not present

## 2021-06-19 DIAGNOSIS — G894 Chronic pain syndrome: Secondary | ICD-10-CM | POA: Diagnosis not present

## 2021-06-19 DIAGNOSIS — I69354 Hemiplegia and hemiparesis following cerebral infarction affecting left non-dominant side: Secondary | ICD-10-CM | POA: Diagnosis not present

## 2021-06-20 DIAGNOSIS — R278 Other lack of coordination: Secondary | ICD-10-CM | POA: Diagnosis not present

## 2021-06-20 DIAGNOSIS — R296 Repeated falls: Secondary | ICD-10-CM | POA: Diagnosis not present

## 2021-06-20 DIAGNOSIS — J449 Chronic obstructive pulmonary disease, unspecified: Secondary | ICD-10-CM | POA: Diagnosis not present

## 2021-06-20 DIAGNOSIS — I69354 Hemiplegia and hemiparesis following cerebral infarction affecting left non-dominant side: Secondary | ICD-10-CM | POA: Diagnosis not present

## 2021-06-20 DIAGNOSIS — G894 Chronic pain syndrome: Secondary | ICD-10-CM | POA: Diagnosis not present

## 2021-06-20 DIAGNOSIS — M6281 Muscle weakness (generalized): Secondary | ICD-10-CM | POA: Diagnosis not present

## 2021-06-21 DIAGNOSIS — G894 Chronic pain syndrome: Secondary | ICD-10-CM | POA: Diagnosis not present

## 2021-06-21 DIAGNOSIS — R296 Repeated falls: Secondary | ICD-10-CM | POA: Diagnosis not present

## 2021-06-21 DIAGNOSIS — M6281 Muscle weakness (generalized): Secondary | ICD-10-CM | POA: Diagnosis not present

## 2021-06-21 DIAGNOSIS — R278 Other lack of coordination: Secondary | ICD-10-CM | POA: Diagnosis not present

## 2021-06-21 DIAGNOSIS — I69354 Hemiplegia and hemiparesis following cerebral infarction affecting left non-dominant side: Secondary | ICD-10-CM | POA: Diagnosis not present

## 2021-06-21 DIAGNOSIS — J449 Chronic obstructive pulmonary disease, unspecified: Secondary | ICD-10-CM | POA: Diagnosis not present

## 2021-06-22 DIAGNOSIS — J449 Chronic obstructive pulmonary disease, unspecified: Secondary | ICD-10-CM | POA: Diagnosis not present

## 2021-06-22 DIAGNOSIS — I69354 Hemiplegia and hemiparesis following cerebral infarction affecting left non-dominant side: Secondary | ICD-10-CM | POA: Diagnosis not present

## 2021-06-22 DIAGNOSIS — R278 Other lack of coordination: Secondary | ICD-10-CM | POA: Diagnosis not present

## 2021-06-22 DIAGNOSIS — M6281 Muscle weakness (generalized): Secondary | ICD-10-CM | POA: Diagnosis not present

## 2021-06-22 DIAGNOSIS — R296 Repeated falls: Secondary | ICD-10-CM | POA: Diagnosis not present

## 2021-06-22 DIAGNOSIS — G894 Chronic pain syndrome: Secondary | ICD-10-CM | POA: Diagnosis not present

## 2021-06-23 DIAGNOSIS — R296 Repeated falls: Secondary | ICD-10-CM | POA: Diagnosis not present

## 2021-06-23 DIAGNOSIS — I69354 Hemiplegia and hemiparesis following cerebral infarction affecting left non-dominant side: Secondary | ICD-10-CM | POA: Diagnosis not present

## 2021-06-23 DIAGNOSIS — G894 Chronic pain syndrome: Secondary | ICD-10-CM | POA: Diagnosis not present

## 2021-06-23 DIAGNOSIS — J449 Chronic obstructive pulmonary disease, unspecified: Secondary | ICD-10-CM | POA: Diagnosis not present

## 2021-06-23 DIAGNOSIS — R278 Other lack of coordination: Secondary | ICD-10-CM | POA: Diagnosis not present

## 2021-06-23 DIAGNOSIS — M6281 Muscle weakness (generalized): Secondary | ICD-10-CM | POA: Diagnosis not present

## 2021-06-24 DIAGNOSIS — G894 Chronic pain syndrome: Secondary | ICD-10-CM | POA: Diagnosis not present

## 2021-06-24 DIAGNOSIS — R296 Repeated falls: Secondary | ICD-10-CM | POA: Diagnosis not present

## 2021-06-24 DIAGNOSIS — R278 Other lack of coordination: Secondary | ICD-10-CM | POA: Diagnosis not present

## 2021-06-24 DIAGNOSIS — J449 Chronic obstructive pulmonary disease, unspecified: Secondary | ICD-10-CM | POA: Diagnosis not present

## 2021-06-24 DIAGNOSIS — M6281 Muscle weakness (generalized): Secondary | ICD-10-CM | POA: Diagnosis not present

## 2021-06-24 DIAGNOSIS — I69354 Hemiplegia and hemiparesis following cerebral infarction affecting left non-dominant side: Secondary | ICD-10-CM | POA: Diagnosis not present

## 2021-06-26 DIAGNOSIS — J449 Chronic obstructive pulmonary disease, unspecified: Secondary | ICD-10-CM | POA: Diagnosis not present

## 2021-06-26 DIAGNOSIS — R278 Other lack of coordination: Secondary | ICD-10-CM | POA: Diagnosis not present

## 2021-06-26 DIAGNOSIS — M6281 Muscle weakness (generalized): Secondary | ICD-10-CM | POA: Diagnosis not present

## 2021-06-26 DIAGNOSIS — R296 Repeated falls: Secondary | ICD-10-CM | POA: Diagnosis not present

## 2021-06-26 DIAGNOSIS — I69354 Hemiplegia and hemiparesis following cerebral infarction affecting left non-dominant side: Secondary | ICD-10-CM | POA: Diagnosis not present

## 2021-06-26 DIAGNOSIS — G894 Chronic pain syndrome: Secondary | ICD-10-CM | POA: Diagnosis not present

## 2021-06-27 DIAGNOSIS — J449 Chronic obstructive pulmonary disease, unspecified: Secondary | ICD-10-CM | POA: Diagnosis not present

## 2021-06-27 DIAGNOSIS — R278 Other lack of coordination: Secondary | ICD-10-CM | POA: Diagnosis not present

## 2021-06-27 DIAGNOSIS — G894 Chronic pain syndrome: Secondary | ICD-10-CM | POA: Diagnosis not present

## 2021-06-27 DIAGNOSIS — M6281 Muscle weakness (generalized): Secondary | ICD-10-CM | POA: Diagnosis not present

## 2021-06-27 DIAGNOSIS — R296 Repeated falls: Secondary | ICD-10-CM | POA: Diagnosis not present

## 2021-06-27 DIAGNOSIS — I69354 Hemiplegia and hemiparesis following cerebral infarction affecting left non-dominant side: Secondary | ICD-10-CM | POA: Diagnosis not present

## 2021-06-28 DIAGNOSIS — R278 Other lack of coordination: Secondary | ICD-10-CM | POA: Diagnosis not present

## 2021-06-28 DIAGNOSIS — J449 Chronic obstructive pulmonary disease, unspecified: Secondary | ICD-10-CM | POA: Diagnosis not present

## 2021-06-28 DIAGNOSIS — M6281 Muscle weakness (generalized): Secondary | ICD-10-CM | POA: Diagnosis not present

## 2021-06-28 DIAGNOSIS — I69354 Hemiplegia and hemiparesis following cerebral infarction affecting left non-dominant side: Secondary | ICD-10-CM | POA: Diagnosis not present

## 2021-06-28 DIAGNOSIS — R296 Repeated falls: Secondary | ICD-10-CM | POA: Diagnosis not present

## 2021-06-28 DIAGNOSIS — G894 Chronic pain syndrome: Secondary | ICD-10-CM | POA: Diagnosis not present

## 2021-06-29 DIAGNOSIS — G894 Chronic pain syndrome: Secondary | ICD-10-CM | POA: Diagnosis not present

## 2021-06-29 DIAGNOSIS — M6281 Muscle weakness (generalized): Secondary | ICD-10-CM | POA: Diagnosis not present

## 2021-06-29 DIAGNOSIS — J449 Chronic obstructive pulmonary disease, unspecified: Secondary | ICD-10-CM | POA: Diagnosis not present

## 2021-06-29 DIAGNOSIS — R278 Other lack of coordination: Secondary | ICD-10-CM | POA: Diagnosis not present

## 2021-06-29 DIAGNOSIS — I69354 Hemiplegia and hemiparesis following cerebral infarction affecting left non-dominant side: Secondary | ICD-10-CM | POA: Diagnosis not present

## 2021-06-29 DIAGNOSIS — R296 Repeated falls: Secondary | ICD-10-CM | POA: Diagnosis not present

## 2021-06-30 DIAGNOSIS — R296 Repeated falls: Secondary | ICD-10-CM | POA: Diagnosis not present

## 2021-06-30 DIAGNOSIS — M6281 Muscle weakness (generalized): Secondary | ICD-10-CM | POA: Diagnosis not present

## 2021-06-30 DIAGNOSIS — J449 Chronic obstructive pulmonary disease, unspecified: Secondary | ICD-10-CM | POA: Diagnosis not present

## 2021-06-30 DIAGNOSIS — R278 Other lack of coordination: Secondary | ICD-10-CM | POA: Diagnosis not present

## 2021-06-30 DIAGNOSIS — G894 Chronic pain syndrome: Secondary | ICD-10-CM | POA: Diagnosis not present

## 2021-06-30 DIAGNOSIS — I69354 Hemiplegia and hemiparesis following cerebral infarction affecting left non-dominant side: Secondary | ICD-10-CM | POA: Diagnosis not present

## 2021-07-01 DIAGNOSIS — M6281 Muscle weakness (generalized): Secondary | ICD-10-CM | POA: Diagnosis not present

## 2021-07-01 DIAGNOSIS — R296 Repeated falls: Secondary | ICD-10-CM | POA: Diagnosis not present

## 2021-07-01 DIAGNOSIS — I69354 Hemiplegia and hemiparesis following cerebral infarction affecting left non-dominant side: Secondary | ICD-10-CM | POA: Diagnosis not present

## 2021-07-01 DIAGNOSIS — R278 Other lack of coordination: Secondary | ICD-10-CM | POA: Diagnosis not present

## 2021-07-01 DIAGNOSIS — G894 Chronic pain syndrome: Secondary | ICD-10-CM | POA: Diagnosis not present

## 2021-07-01 DIAGNOSIS — J449 Chronic obstructive pulmonary disease, unspecified: Secondary | ICD-10-CM | POA: Diagnosis not present

## 2021-07-03 DIAGNOSIS — R278 Other lack of coordination: Secondary | ICD-10-CM | POA: Diagnosis not present

## 2021-07-03 DIAGNOSIS — M6281 Muscle weakness (generalized): Secondary | ICD-10-CM | POA: Diagnosis not present

## 2021-07-03 DIAGNOSIS — J449 Chronic obstructive pulmonary disease, unspecified: Secondary | ICD-10-CM | POA: Diagnosis not present

## 2021-07-03 DIAGNOSIS — G894 Chronic pain syndrome: Secondary | ICD-10-CM | POA: Diagnosis not present

## 2021-07-03 DIAGNOSIS — R296 Repeated falls: Secondary | ICD-10-CM | POA: Diagnosis not present

## 2021-07-03 DIAGNOSIS — I69354 Hemiplegia and hemiparesis following cerebral infarction affecting left non-dominant side: Secondary | ICD-10-CM | POA: Diagnosis not present

## 2021-07-04 DIAGNOSIS — J449 Chronic obstructive pulmonary disease, unspecified: Secondary | ICD-10-CM | POA: Diagnosis not present

## 2021-07-04 DIAGNOSIS — I69354 Hemiplegia and hemiparesis following cerebral infarction affecting left non-dominant side: Secondary | ICD-10-CM | POA: Diagnosis not present

## 2021-07-04 DIAGNOSIS — G894 Chronic pain syndrome: Secondary | ICD-10-CM | POA: Diagnosis not present

## 2021-07-04 DIAGNOSIS — M6281 Muscle weakness (generalized): Secondary | ICD-10-CM | POA: Diagnosis not present

## 2021-07-04 DIAGNOSIS — R278 Other lack of coordination: Secondary | ICD-10-CM | POA: Diagnosis not present

## 2021-07-04 DIAGNOSIS — R296 Repeated falls: Secondary | ICD-10-CM | POA: Diagnosis not present

## 2021-07-05 DIAGNOSIS — R296 Repeated falls: Secondary | ICD-10-CM | POA: Diagnosis not present

## 2021-07-05 DIAGNOSIS — I69354 Hemiplegia and hemiparesis following cerebral infarction affecting left non-dominant side: Secondary | ICD-10-CM | POA: Diagnosis not present

## 2021-07-05 DIAGNOSIS — G894 Chronic pain syndrome: Secondary | ICD-10-CM | POA: Diagnosis not present

## 2021-07-05 DIAGNOSIS — J449 Chronic obstructive pulmonary disease, unspecified: Secondary | ICD-10-CM | POA: Diagnosis not present

## 2021-07-05 DIAGNOSIS — R278 Other lack of coordination: Secondary | ICD-10-CM | POA: Diagnosis not present

## 2021-07-05 DIAGNOSIS — M6281 Muscle weakness (generalized): Secondary | ICD-10-CM | POA: Diagnosis not present

## 2021-07-07 DIAGNOSIS — M6281 Muscle weakness (generalized): Secondary | ICD-10-CM | POA: Diagnosis not present

## 2021-07-07 DIAGNOSIS — R278 Other lack of coordination: Secondary | ICD-10-CM | POA: Diagnosis not present

## 2021-07-07 DIAGNOSIS — I69354 Hemiplegia and hemiparesis following cerebral infarction affecting left non-dominant side: Secondary | ICD-10-CM | POA: Diagnosis not present

## 2021-07-07 DIAGNOSIS — G894 Chronic pain syndrome: Secondary | ICD-10-CM | POA: Diagnosis not present

## 2021-07-07 DIAGNOSIS — R296 Repeated falls: Secondary | ICD-10-CM | POA: Diagnosis not present

## 2021-07-07 DIAGNOSIS — J449 Chronic obstructive pulmonary disease, unspecified: Secondary | ICD-10-CM | POA: Diagnosis not present

## 2021-07-08 DIAGNOSIS — G894 Chronic pain syndrome: Secondary | ICD-10-CM | POA: Diagnosis not present

## 2021-07-08 DIAGNOSIS — R278 Other lack of coordination: Secondary | ICD-10-CM | POA: Diagnosis not present

## 2021-07-08 DIAGNOSIS — R296 Repeated falls: Secondary | ICD-10-CM | POA: Diagnosis not present

## 2021-07-08 DIAGNOSIS — J449 Chronic obstructive pulmonary disease, unspecified: Secondary | ICD-10-CM | POA: Diagnosis not present

## 2021-07-08 DIAGNOSIS — M6281 Muscle weakness (generalized): Secondary | ICD-10-CM | POA: Diagnosis not present

## 2021-07-08 DIAGNOSIS — I69354 Hemiplegia and hemiparesis following cerebral infarction affecting left non-dominant side: Secondary | ICD-10-CM | POA: Diagnosis not present

## 2021-07-10 DIAGNOSIS — R278 Other lack of coordination: Secondary | ICD-10-CM | POA: Diagnosis not present

## 2021-07-10 DIAGNOSIS — J449 Chronic obstructive pulmonary disease, unspecified: Secondary | ICD-10-CM | POA: Diagnosis not present

## 2021-07-10 DIAGNOSIS — I69354 Hemiplegia and hemiparesis following cerebral infarction affecting left non-dominant side: Secondary | ICD-10-CM | POA: Diagnosis not present

## 2021-07-10 DIAGNOSIS — R296 Repeated falls: Secondary | ICD-10-CM | POA: Diagnosis not present

## 2021-07-10 DIAGNOSIS — M6281 Muscle weakness (generalized): Secondary | ICD-10-CM | POA: Diagnosis not present

## 2021-07-10 DIAGNOSIS — G894 Chronic pain syndrome: Secondary | ICD-10-CM | POA: Diagnosis not present

## 2021-07-11 DIAGNOSIS — J449 Chronic obstructive pulmonary disease, unspecified: Secondary | ICD-10-CM | POA: Diagnosis not present

## 2021-07-11 DIAGNOSIS — M6281 Muscle weakness (generalized): Secondary | ICD-10-CM | POA: Diagnosis not present

## 2021-07-11 DIAGNOSIS — G894 Chronic pain syndrome: Secondary | ICD-10-CM | POA: Diagnosis not present

## 2021-07-11 DIAGNOSIS — R278 Other lack of coordination: Secondary | ICD-10-CM | POA: Diagnosis not present

## 2021-07-11 DIAGNOSIS — I69354 Hemiplegia and hemiparesis following cerebral infarction affecting left non-dominant side: Secondary | ICD-10-CM | POA: Diagnosis not present

## 2021-07-11 DIAGNOSIS — R296 Repeated falls: Secondary | ICD-10-CM | POA: Diagnosis not present

## 2021-07-12 DIAGNOSIS — R55 Syncope and collapse: Secondary | ICD-10-CM | POA: Diagnosis not present

## 2021-07-12 DIAGNOSIS — I10 Essential (primary) hypertension: Secondary | ICD-10-CM | POA: Diagnosis not present

## 2021-07-12 DIAGNOSIS — I480 Paroxysmal atrial fibrillation: Secondary | ICD-10-CM | POA: Diagnosis not present

## 2021-07-12 DIAGNOSIS — R278 Other lack of coordination: Secondary | ICD-10-CM | POA: Diagnosis not present

## 2021-07-12 DIAGNOSIS — R296 Repeated falls: Secondary | ICD-10-CM | POA: Diagnosis not present

## 2021-07-12 DIAGNOSIS — S0003XA Contusion of scalp, initial encounter: Secondary | ICD-10-CM | POA: Diagnosis not present

## 2021-07-12 DIAGNOSIS — J449 Chronic obstructive pulmonary disease, unspecified: Secondary | ICD-10-CM | POA: Diagnosis not present

## 2021-07-12 DIAGNOSIS — G894 Chronic pain syndrome: Secondary | ICD-10-CM | POA: Diagnosis not present

## 2021-07-12 DIAGNOSIS — S61419A Laceration without foreign body of unspecified hand, initial encounter: Secondary | ICD-10-CM | POA: Diagnosis not present

## 2021-07-12 DIAGNOSIS — I5042 Chronic combined systolic (congestive) and diastolic (congestive) heart failure: Secondary | ICD-10-CM | POA: Diagnosis not present

## 2021-07-12 DIAGNOSIS — R031 Nonspecific low blood-pressure reading: Secondary | ICD-10-CM | POA: Diagnosis not present

## 2021-07-12 DIAGNOSIS — Z7189 Other specified counseling: Secondary | ICD-10-CM | POA: Diagnosis not present

## 2021-07-12 DIAGNOSIS — M6281 Muscle weakness (generalized): Secondary | ICD-10-CM | POA: Diagnosis not present

## 2021-07-12 DIAGNOSIS — I69354 Hemiplegia and hemiparesis following cerebral infarction affecting left non-dominant side: Secondary | ICD-10-CM | POA: Diagnosis not present

## 2021-07-12 DIAGNOSIS — D649 Anemia, unspecified: Secondary | ICD-10-CM | POA: Diagnosis not present

## 2021-07-13 DIAGNOSIS — G894 Chronic pain syndrome: Secondary | ICD-10-CM | POA: Diagnosis not present

## 2021-07-13 DIAGNOSIS — R278 Other lack of coordination: Secondary | ICD-10-CM | POA: Diagnosis not present

## 2021-07-13 DIAGNOSIS — I69354 Hemiplegia and hemiparesis following cerebral infarction affecting left non-dominant side: Secondary | ICD-10-CM | POA: Diagnosis not present

## 2021-07-13 DIAGNOSIS — R9431 Abnormal electrocardiogram [ECG] [EKG]: Secondary | ICD-10-CM | POA: Diagnosis not present

## 2021-07-13 DIAGNOSIS — J449 Chronic obstructive pulmonary disease, unspecified: Secondary | ICD-10-CM | POA: Diagnosis not present

## 2021-07-13 DIAGNOSIS — R296 Repeated falls: Secondary | ICD-10-CM | POA: Diagnosis not present

## 2021-07-13 DIAGNOSIS — M6281 Muscle weakness (generalized): Secondary | ICD-10-CM | POA: Diagnosis not present

## 2021-07-13 DIAGNOSIS — I447 Left bundle-branch block, unspecified: Secondary | ICD-10-CM | POA: Diagnosis not present

## 2021-07-14 DIAGNOSIS — R296 Repeated falls: Secondary | ICD-10-CM | POA: Diagnosis not present

## 2021-07-14 DIAGNOSIS — E860 Dehydration: Secondary | ICD-10-CM | POA: Diagnosis not present

## 2021-07-14 DIAGNOSIS — R031 Nonspecific low blood-pressure reading: Secondary | ICD-10-CM | POA: Diagnosis not present

## 2021-07-14 DIAGNOSIS — J449 Chronic obstructive pulmonary disease, unspecified: Secondary | ICD-10-CM | POA: Diagnosis not present

## 2021-07-14 DIAGNOSIS — R278 Other lack of coordination: Secondary | ICD-10-CM | POA: Diagnosis not present

## 2021-07-14 DIAGNOSIS — M6281 Muscle weakness (generalized): Secondary | ICD-10-CM | POA: Diagnosis not present

## 2021-07-14 DIAGNOSIS — R55 Syncope and collapse: Secondary | ICD-10-CM | POA: Diagnosis not present

## 2021-07-14 DIAGNOSIS — E871 Hypo-osmolality and hyponatremia: Secondary | ICD-10-CM | POA: Diagnosis not present

## 2021-07-14 DIAGNOSIS — G894 Chronic pain syndrome: Secondary | ICD-10-CM | POA: Diagnosis not present

## 2021-07-14 DIAGNOSIS — I69354 Hemiplegia and hemiparesis following cerebral infarction affecting left non-dominant side: Secondary | ICD-10-CM | POA: Diagnosis not present

## 2021-07-17 DIAGNOSIS — R278 Other lack of coordination: Secondary | ICD-10-CM | POA: Diagnosis not present

## 2021-07-17 DIAGNOSIS — R296 Repeated falls: Secondary | ICD-10-CM | POA: Diagnosis not present

## 2021-07-17 DIAGNOSIS — I5042 Chronic combined systolic (congestive) and diastolic (congestive) heart failure: Secondary | ICD-10-CM | POA: Diagnosis not present

## 2021-07-17 DIAGNOSIS — I779 Disorder of arteries and arterioles, unspecified: Secondary | ICD-10-CM | POA: Diagnosis not present

## 2021-07-17 DIAGNOSIS — N183 Chronic kidney disease, stage 3 unspecified: Secondary | ICD-10-CM | POA: Diagnosis not present

## 2021-07-17 DIAGNOSIS — I480 Paroxysmal atrial fibrillation: Secondary | ICD-10-CM | POA: Diagnosis not present

## 2021-07-17 DIAGNOSIS — I69359 Hemiplegia and hemiparesis following cerebral infarction affecting unspecified side: Secondary | ICD-10-CM | POA: Diagnosis not present

## 2021-07-17 DIAGNOSIS — F0150 Vascular dementia without behavioral disturbance: Secondary | ICD-10-CM | POA: Diagnosis not present

## 2021-07-17 DIAGNOSIS — Z87898 Personal history of other specified conditions: Secondary | ICD-10-CM | POA: Diagnosis not present

## 2021-07-17 DIAGNOSIS — I69354 Hemiplegia and hemiparesis following cerebral infarction affecting left non-dominant side: Secondary | ICD-10-CM | POA: Diagnosis not present

## 2021-07-17 DIAGNOSIS — M6281 Muscle weakness (generalized): Secondary | ICD-10-CM | POA: Diagnosis not present

## 2021-07-17 DIAGNOSIS — J449 Chronic obstructive pulmonary disease, unspecified: Secondary | ICD-10-CM | POA: Diagnosis not present

## 2021-07-17 DIAGNOSIS — G894 Chronic pain syndrome: Secondary | ICD-10-CM | POA: Diagnosis not present

## 2021-07-17 DIAGNOSIS — I739 Peripheral vascular disease, unspecified: Secondary | ICD-10-CM | POA: Diagnosis not present

## 2021-07-17 DIAGNOSIS — M329 Systemic lupus erythematosus, unspecified: Secondary | ICD-10-CM | POA: Diagnosis not present

## 2021-07-18 DIAGNOSIS — R296 Repeated falls: Secondary | ICD-10-CM | POA: Diagnosis not present

## 2021-07-18 DIAGNOSIS — J449 Chronic obstructive pulmonary disease, unspecified: Secondary | ICD-10-CM | POA: Diagnosis not present

## 2021-07-18 DIAGNOSIS — R278 Other lack of coordination: Secondary | ICD-10-CM | POA: Diagnosis not present

## 2021-07-18 DIAGNOSIS — I69354 Hemiplegia and hemiparesis following cerebral infarction affecting left non-dominant side: Secondary | ICD-10-CM | POA: Diagnosis not present

## 2021-07-18 DIAGNOSIS — M6281 Muscle weakness (generalized): Secondary | ICD-10-CM | POA: Diagnosis not present

## 2021-07-18 DIAGNOSIS — G894 Chronic pain syndrome: Secondary | ICD-10-CM | POA: Diagnosis not present

## 2021-07-19 DIAGNOSIS — G894 Chronic pain syndrome: Secondary | ICD-10-CM | POA: Diagnosis not present

## 2021-07-19 DIAGNOSIS — I69354 Hemiplegia and hemiparesis following cerebral infarction affecting left non-dominant side: Secondary | ICD-10-CM | POA: Diagnosis not present

## 2021-07-19 DIAGNOSIS — R296 Repeated falls: Secondary | ICD-10-CM | POA: Diagnosis not present

## 2021-07-19 DIAGNOSIS — M6281 Muscle weakness (generalized): Secondary | ICD-10-CM | POA: Diagnosis not present

## 2021-07-19 DIAGNOSIS — J449 Chronic obstructive pulmonary disease, unspecified: Secondary | ICD-10-CM | POA: Diagnosis not present

## 2021-07-19 DIAGNOSIS — R278 Other lack of coordination: Secondary | ICD-10-CM | POA: Diagnosis not present

## 2021-07-20 DIAGNOSIS — R296 Repeated falls: Secondary | ICD-10-CM | POA: Diagnosis not present

## 2021-07-20 DIAGNOSIS — I69354 Hemiplegia and hemiparesis following cerebral infarction affecting left non-dominant side: Secondary | ICD-10-CM | POA: Diagnosis not present

## 2021-07-20 DIAGNOSIS — G894 Chronic pain syndrome: Secondary | ICD-10-CM | POA: Diagnosis not present

## 2021-07-20 DIAGNOSIS — J449 Chronic obstructive pulmonary disease, unspecified: Secondary | ICD-10-CM | POA: Diagnosis not present

## 2021-07-20 DIAGNOSIS — R278 Other lack of coordination: Secondary | ICD-10-CM | POA: Diagnosis not present

## 2021-07-20 DIAGNOSIS — M6281 Muscle weakness (generalized): Secondary | ICD-10-CM | POA: Diagnosis not present

## 2021-07-21 DIAGNOSIS — R278 Other lack of coordination: Secondary | ICD-10-CM | POA: Diagnosis not present

## 2021-07-21 DIAGNOSIS — G894 Chronic pain syndrome: Secondary | ICD-10-CM | POA: Diagnosis not present

## 2021-07-21 DIAGNOSIS — M6281 Muscle weakness (generalized): Secondary | ICD-10-CM | POA: Diagnosis not present

## 2021-07-21 DIAGNOSIS — J449 Chronic obstructive pulmonary disease, unspecified: Secondary | ICD-10-CM | POA: Diagnosis not present

## 2021-07-21 DIAGNOSIS — I69354 Hemiplegia and hemiparesis following cerebral infarction affecting left non-dominant side: Secondary | ICD-10-CM | POA: Diagnosis not present

## 2021-07-21 DIAGNOSIS — R296 Repeated falls: Secondary | ICD-10-CM | POA: Diagnosis not present

## 2021-07-25 DIAGNOSIS — I69354 Hemiplegia and hemiparesis following cerebral infarction affecting left non-dominant side: Secondary | ICD-10-CM | POA: Diagnosis not present

## 2021-07-25 DIAGNOSIS — R296 Repeated falls: Secondary | ICD-10-CM | POA: Diagnosis not present

## 2021-07-25 DIAGNOSIS — M6281 Muscle weakness (generalized): Secondary | ICD-10-CM | POA: Diagnosis not present

## 2021-07-25 DIAGNOSIS — J449 Chronic obstructive pulmonary disease, unspecified: Secondary | ICD-10-CM | POA: Diagnosis not present

## 2021-07-25 DIAGNOSIS — R278 Other lack of coordination: Secondary | ICD-10-CM | POA: Diagnosis not present

## 2021-07-25 DIAGNOSIS — G894 Chronic pain syndrome: Secondary | ICD-10-CM | POA: Diagnosis not present

## 2021-07-26 DIAGNOSIS — M6281 Muscle weakness (generalized): Secondary | ICD-10-CM | POA: Diagnosis not present

## 2021-07-26 DIAGNOSIS — I69354 Hemiplegia and hemiparesis following cerebral infarction affecting left non-dominant side: Secondary | ICD-10-CM | POA: Diagnosis not present

## 2021-07-26 DIAGNOSIS — R296 Repeated falls: Secondary | ICD-10-CM | POA: Diagnosis not present

## 2021-07-26 DIAGNOSIS — G894 Chronic pain syndrome: Secondary | ICD-10-CM | POA: Diagnosis not present

## 2021-07-26 DIAGNOSIS — R278 Other lack of coordination: Secondary | ICD-10-CM | POA: Diagnosis not present

## 2021-07-26 DIAGNOSIS — J449 Chronic obstructive pulmonary disease, unspecified: Secondary | ICD-10-CM | POA: Diagnosis not present

## 2021-07-27 DIAGNOSIS — R278 Other lack of coordination: Secondary | ICD-10-CM | POA: Diagnosis not present

## 2021-07-27 DIAGNOSIS — G894 Chronic pain syndrome: Secondary | ICD-10-CM | POA: Diagnosis not present

## 2021-07-27 DIAGNOSIS — I69354 Hemiplegia and hemiparesis following cerebral infarction affecting left non-dominant side: Secondary | ICD-10-CM | POA: Diagnosis not present

## 2021-07-27 DIAGNOSIS — R296 Repeated falls: Secondary | ICD-10-CM | POA: Diagnosis not present

## 2021-07-27 DIAGNOSIS — M6281 Muscle weakness (generalized): Secondary | ICD-10-CM | POA: Diagnosis not present

## 2021-07-27 DIAGNOSIS — J449 Chronic obstructive pulmonary disease, unspecified: Secondary | ICD-10-CM | POA: Diagnosis not present

## 2021-07-28 DIAGNOSIS — I69354 Hemiplegia and hemiparesis following cerebral infarction affecting left non-dominant side: Secondary | ICD-10-CM | POA: Diagnosis not present

## 2021-07-28 DIAGNOSIS — G894 Chronic pain syndrome: Secondary | ICD-10-CM | POA: Diagnosis not present

## 2021-07-28 DIAGNOSIS — R296 Repeated falls: Secondary | ICD-10-CM | POA: Diagnosis not present

## 2021-07-28 DIAGNOSIS — J449 Chronic obstructive pulmonary disease, unspecified: Secondary | ICD-10-CM | POA: Diagnosis not present

## 2021-07-28 DIAGNOSIS — M6281 Muscle weakness (generalized): Secondary | ICD-10-CM | POA: Diagnosis not present

## 2021-07-28 DIAGNOSIS — R278 Other lack of coordination: Secondary | ICD-10-CM | POA: Diagnosis not present

## 2021-07-31 DIAGNOSIS — R1110 Vomiting, unspecified: Secondary | ICD-10-CM | POA: Diagnosis not present

## 2021-07-31 DIAGNOSIS — R296 Repeated falls: Secondary | ICD-10-CM | POA: Diagnosis not present

## 2021-07-31 DIAGNOSIS — I69354 Hemiplegia and hemiparesis following cerebral infarction affecting left non-dominant side: Secondary | ICD-10-CM | POA: Diagnosis not present

## 2021-07-31 DIAGNOSIS — G894 Chronic pain syndrome: Secondary | ICD-10-CM | POA: Diagnosis not present

## 2021-07-31 DIAGNOSIS — R55 Syncope and collapse: Secondary | ICD-10-CM | POA: Diagnosis not present

## 2021-07-31 DIAGNOSIS — M6281 Muscle weakness (generalized): Secondary | ICD-10-CM | POA: Diagnosis not present

## 2021-07-31 DIAGNOSIS — R278 Other lack of coordination: Secondary | ICD-10-CM | POA: Diagnosis not present

## 2021-07-31 DIAGNOSIS — E871 Hypo-osmolality and hyponatremia: Secondary | ICD-10-CM | POA: Diagnosis not present

## 2021-07-31 DIAGNOSIS — J449 Chronic obstructive pulmonary disease, unspecified: Secondary | ICD-10-CM | POA: Diagnosis not present

## 2021-08-01 DIAGNOSIS — R296 Repeated falls: Secondary | ICD-10-CM | POA: Diagnosis not present

## 2021-08-01 DIAGNOSIS — G894 Chronic pain syndrome: Secondary | ICD-10-CM | POA: Diagnosis not present

## 2021-08-01 DIAGNOSIS — R059 Cough, unspecified: Secondary | ICD-10-CM | POA: Diagnosis not present

## 2021-08-01 DIAGNOSIS — J449 Chronic obstructive pulmonary disease, unspecified: Secondary | ICD-10-CM | POA: Diagnosis not present

## 2021-08-01 DIAGNOSIS — R1110 Vomiting, unspecified: Secondary | ICD-10-CM | POA: Diagnosis not present

## 2021-08-01 DIAGNOSIS — R278 Other lack of coordination: Secondary | ICD-10-CM | POA: Diagnosis not present

## 2021-08-01 DIAGNOSIS — R918 Other nonspecific abnormal finding of lung field: Secondary | ICD-10-CM | POA: Diagnosis not present

## 2021-08-01 DIAGNOSIS — M6281 Muscle weakness (generalized): Secondary | ICD-10-CM | POA: Diagnosis not present

## 2021-08-01 DIAGNOSIS — I69354 Hemiplegia and hemiparesis following cerebral infarction affecting left non-dominant side: Secondary | ICD-10-CM | POA: Diagnosis not present

## 2021-08-02 DIAGNOSIS — D75839 Thrombocytosis, unspecified: Secondary | ICD-10-CM | POA: Diagnosis not present

## 2021-08-02 DIAGNOSIS — N179 Acute kidney failure, unspecified: Secondary | ICD-10-CM | POA: Diagnosis not present

## 2021-08-02 DIAGNOSIS — R296 Repeated falls: Secondary | ICD-10-CM | POA: Diagnosis not present

## 2021-08-02 DIAGNOSIS — K5900 Constipation, unspecified: Secondary | ICD-10-CM | POA: Diagnosis not present

## 2021-08-02 DIAGNOSIS — M8580 Other specified disorders of bone density and structure, unspecified site: Secondary | ICD-10-CM | POA: Diagnosis not present

## 2021-08-02 DIAGNOSIS — J449 Chronic obstructive pulmonary disease, unspecified: Secondary | ICD-10-CM | POA: Diagnosis not present

## 2021-08-02 DIAGNOSIS — M6281 Muscle weakness (generalized): Secondary | ICD-10-CM | POA: Diagnosis not present

## 2021-08-02 DIAGNOSIS — E871 Hypo-osmolality and hyponatremia: Secondary | ICD-10-CM | POA: Diagnosis not present

## 2021-08-02 DIAGNOSIS — R918 Other nonspecific abnormal finding of lung field: Secondary | ICD-10-CM | POA: Diagnosis not present

## 2021-08-02 DIAGNOSIS — R278 Other lack of coordination: Secondary | ICD-10-CM | POA: Diagnosis not present

## 2021-08-02 DIAGNOSIS — N390 Urinary tract infection, site not specified: Secondary | ICD-10-CM | POA: Diagnosis not present

## 2021-08-02 DIAGNOSIS — I69354 Hemiplegia and hemiparesis following cerebral infarction affecting left non-dominant side: Secondary | ICD-10-CM | POA: Diagnosis not present

## 2021-08-02 DIAGNOSIS — D649 Anemia, unspecified: Secondary | ICD-10-CM | POA: Diagnosis not present

## 2021-08-02 DIAGNOSIS — G894 Chronic pain syndrome: Secondary | ICD-10-CM | POA: Diagnosis not present

## 2021-08-04 DIAGNOSIS — I10 Essential (primary) hypertension: Secondary | ICD-10-CM | POA: Diagnosis not present

## 2021-08-04 DIAGNOSIS — E785 Hyperlipidemia, unspecified: Secondary | ICD-10-CM | POA: Diagnosis not present

## 2021-08-04 DIAGNOSIS — N179 Acute kidney failure, unspecified: Secondary | ICD-10-CM | POA: Diagnosis not present

## 2021-08-04 DIAGNOSIS — N3000 Acute cystitis without hematuria: Secondary | ICD-10-CM | POA: Diagnosis not present

## 2021-08-04 DIAGNOSIS — E559 Vitamin D deficiency, unspecified: Secondary | ICD-10-CM | POA: Diagnosis not present

## 2021-08-04 DIAGNOSIS — R55 Syncope and collapse: Secondary | ICD-10-CM | POA: Diagnosis not present

## 2021-08-04 DIAGNOSIS — D509 Iron deficiency anemia, unspecified: Secondary | ICD-10-CM | POA: Diagnosis not present

## 2021-08-06 DIAGNOSIS — R918 Other nonspecific abnormal finding of lung field: Secondary | ICD-10-CM | POA: Diagnosis not present

## 2021-08-07 DIAGNOSIS — R918 Other nonspecific abnormal finding of lung field: Secondary | ICD-10-CM | POA: Diagnosis not present

## 2021-08-07 DIAGNOSIS — N3000 Acute cystitis without hematuria: Secondary | ICD-10-CM | POA: Diagnosis not present

## 2021-08-07 DIAGNOSIS — N179 Acute kidney failure, unspecified: Secondary | ICD-10-CM | POA: Diagnosis not present

## 2021-08-07 DIAGNOSIS — E860 Dehydration: Secondary | ICD-10-CM | POA: Diagnosis not present

## 2021-08-11 ENCOUNTER — Other Ambulatory Visit: Payer: Self-pay | Admitting: Nurse Practitioner

## 2021-08-11 DIAGNOSIS — R918 Other nonspecific abnormal finding of lung field: Secondary | ICD-10-CM

## 2021-08-11 DIAGNOSIS — N289 Disorder of kidney and ureter, unspecified: Secondary | ICD-10-CM

## 2021-08-14 DIAGNOSIS — I5042 Chronic combined systolic (congestive) and diastolic (congestive) heart failure: Secondary | ICD-10-CM | POA: Diagnosis not present

## 2021-08-14 DIAGNOSIS — I69359 Hemiplegia and hemiparesis following cerebral infarction affecting unspecified side: Secondary | ICD-10-CM | POA: Diagnosis not present

## 2021-08-14 DIAGNOSIS — J449 Chronic obstructive pulmonary disease, unspecified: Secondary | ICD-10-CM | POA: Diagnosis not present

## 2021-08-14 DIAGNOSIS — R55 Syncope and collapse: Secondary | ICD-10-CM | POA: Diagnosis not present

## 2021-08-14 DIAGNOSIS — R918 Other nonspecific abnormal finding of lung field: Secondary | ICD-10-CM | POA: Diagnosis not present

## 2021-08-14 DIAGNOSIS — F17200 Nicotine dependence, unspecified, uncomplicated: Secondary | ICD-10-CM | POA: Diagnosis not present

## 2021-08-14 DIAGNOSIS — I480 Paroxysmal atrial fibrillation: Secondary | ICD-10-CM | POA: Diagnosis not present

## 2021-08-14 DIAGNOSIS — I779 Disorder of arteries and arterioles, unspecified: Secondary | ICD-10-CM | POA: Diagnosis not present

## 2021-08-14 DIAGNOSIS — D649 Anemia, unspecified: Secondary | ICD-10-CM | POA: Diagnosis not present

## 2021-09-04 ENCOUNTER — Other Ambulatory Visit: Payer: Self-pay | Admitting: Nurse Practitioner

## 2021-09-04 DIAGNOSIS — F0390 Unspecified dementia without behavioral disturbance: Secondary | ICD-10-CM

## 2021-09-05 ENCOUNTER — Ambulatory Visit
Admission: RE | Admit: 2021-09-05 | Discharge: 2021-09-05 | Disposition: A | Discharge status: Home / Self Care | Payer: Medicare PPO | Source: Ambulatory Visit | Attending: Nurse Practitioner | Admitting: Nurse Practitioner

## 2021-09-05 ENCOUNTER — Other Ambulatory Visit: Payer: Self-pay

## 2021-09-05 DIAGNOSIS — R911 Solitary pulmonary nodule: Secondary | ICD-10-CM | POA: Diagnosis not present

## 2021-09-05 DIAGNOSIS — I700 Atherosclerosis of aorta: Secondary | ICD-10-CM | POA: Diagnosis not present

## 2021-09-05 DIAGNOSIS — R55 Syncope and collapse: Secondary | ICD-10-CM | POA: Diagnosis not present

## 2021-09-05 DIAGNOSIS — R918 Other nonspecific abnormal finding of lung field: Secondary | ICD-10-CM

## 2021-09-05 DIAGNOSIS — F0390 Unspecified dementia without behavioral disturbance: Secondary | ICD-10-CM

## 2021-09-05 DIAGNOSIS — J439 Emphysema, unspecified: Secondary | ICD-10-CM | POA: Diagnosis not present

## 2021-10-02 DIAGNOSIS — I150 Renovascular hypertension: Secondary | ICD-10-CM | POA: Diagnosis not present

## 2021-10-02 DIAGNOSIS — M329 Systemic lupus erythematosus, unspecified: Secondary | ICD-10-CM | POA: Diagnosis not present

## 2021-10-02 DIAGNOSIS — I480 Paroxysmal atrial fibrillation: Secondary | ICD-10-CM | POA: Diagnosis not present

## 2021-10-02 DIAGNOSIS — J449 Chronic obstructive pulmonary disease, unspecified: Secondary | ICD-10-CM | POA: Diagnosis not present

## 2021-10-02 DIAGNOSIS — N1832 Chronic kidney disease, stage 3b: Secondary | ICD-10-CM | POA: Diagnosis not present

## 2021-10-02 DIAGNOSIS — I5040 Unspecified combined systolic (congestive) and diastolic (congestive) heart failure: Secondary | ICD-10-CM | POA: Diagnosis not present

## 2021-10-02 DIAGNOSIS — I779 Disorder of arteries and arterioles, unspecified: Secondary | ICD-10-CM | POA: Diagnosis not present

## 2021-10-02 DIAGNOSIS — I951 Orthostatic hypotension: Secondary | ICD-10-CM | POA: Diagnosis not present

## 2021-10-02 DIAGNOSIS — I69359 Hemiplegia and hemiparesis following cerebral infarction affecting unspecified side: Secondary | ICD-10-CM | POA: Diagnosis not present

## 2021-10-03 DIAGNOSIS — F4321 Adjustment disorder with depressed mood: Secondary | ICD-10-CM | POA: Diagnosis not present

## 2021-10-04 DIAGNOSIS — F17200 Nicotine dependence, unspecified, uncomplicated: Secondary | ICD-10-CM | POA: Diagnosis not present

## 2021-10-04 DIAGNOSIS — C3491 Malignant neoplasm of unspecified part of right bronchus or lung: Secondary | ICD-10-CM | POA: Diagnosis not present

## 2021-10-04 DIAGNOSIS — F0150 Vascular dementia without behavioral disturbance: Secondary | ICD-10-CM | POA: Diagnosis not present

## 2021-10-06 ENCOUNTER — Other Ambulatory Visit: Payer: Self-pay | Admitting: Family Medicine

## 2021-10-06 DIAGNOSIS — D509 Iron deficiency anemia, unspecified: Secondary | ICD-10-CM | POA: Diagnosis not present

## 2021-10-06 DIAGNOSIS — I10 Essential (primary) hypertension: Secondary | ICD-10-CM | POA: Diagnosis not present

## 2021-10-06 DIAGNOSIS — E785 Hyperlipidemia, unspecified: Secondary | ICD-10-CM | POA: Diagnosis not present

## 2021-10-06 DIAGNOSIS — D649 Anemia, unspecified: Secondary | ICD-10-CM | POA: Diagnosis not present

## 2021-10-06 DIAGNOSIS — R42 Dizziness and giddiness: Secondary | ICD-10-CM | POA: Diagnosis not present

## 2021-10-06 DIAGNOSIS — I501 Left ventricular failure: Secondary | ICD-10-CM | POA: Diagnosis not present

## 2021-10-06 DIAGNOSIS — I5189 Other ill-defined heart diseases: Secondary | ICD-10-CM | POA: Diagnosis not present

## 2021-10-06 DIAGNOSIS — I517 Cardiomegaly: Secondary | ICD-10-CM | POA: Diagnosis not present

## 2021-10-06 DIAGNOSIS — R55 Syncope and collapse: Secondary | ICD-10-CM | POA: Diagnosis not present

## 2021-10-06 DIAGNOSIS — I700 Atherosclerosis of aorta: Secondary | ICD-10-CM | POA: Diagnosis not present

## 2021-10-11 DIAGNOSIS — I359 Nonrheumatic aortic valve disorder, unspecified: Secondary | ICD-10-CM | POA: Diagnosis not present

## 2021-10-11 DIAGNOSIS — D649 Anemia, unspecified: Secondary | ICD-10-CM | POA: Diagnosis not present

## 2021-10-11 DIAGNOSIS — I779 Disorder of arteries and arterioles, unspecified: Secondary | ICD-10-CM | POA: Diagnosis not present

## 2021-10-11 DIAGNOSIS — D75839 Thrombocytosis, unspecified: Secondary | ICD-10-CM | POA: Diagnosis not present

## 2021-10-11 DIAGNOSIS — R55 Syncope and collapse: Secondary | ICD-10-CM | POA: Diagnosis not present

## 2021-10-11 DIAGNOSIS — C3491 Malignant neoplasm of unspecified part of right bronchus or lung: Secondary | ICD-10-CM | POA: Diagnosis not present

## 2021-10-11 DIAGNOSIS — I5042 Chronic combined systolic (congestive) and diastolic (congestive) heart failure: Secondary | ICD-10-CM | POA: Diagnosis not present

## 2021-10-11 DIAGNOSIS — E871 Hypo-osmolality and hyponatremia: Secondary | ICD-10-CM | POA: Diagnosis not present

## 2021-10-11 DIAGNOSIS — R7989 Other specified abnormal findings of blood chemistry: Secondary | ICD-10-CM | POA: Diagnosis not present

## 2021-10-12 DIAGNOSIS — L932 Other local lupus erythematosus: Secondary | ICD-10-CM | POA: Diagnosis not present

## 2021-10-12 DIAGNOSIS — F0150 Vascular dementia without behavioral disturbance: Secondary | ICD-10-CM | POA: Diagnosis not present

## 2021-10-12 DIAGNOSIS — M1990 Unspecified osteoarthritis, unspecified site: Secondary | ICD-10-CM | POA: Diagnosis not present

## 2021-10-12 DIAGNOSIS — M6281 Muscle weakness (generalized): Secondary | ICD-10-CM | POA: Diagnosis not present

## 2021-10-13 DIAGNOSIS — L932 Other local lupus erythematosus: Secondary | ICD-10-CM | POA: Diagnosis not present

## 2021-10-13 DIAGNOSIS — M6281 Muscle weakness (generalized): Secondary | ICD-10-CM | POA: Diagnosis not present

## 2021-10-13 DIAGNOSIS — F0150 Vascular dementia without behavioral disturbance: Secondary | ICD-10-CM | POA: Diagnosis not present

## 2021-10-13 DIAGNOSIS — M1990 Unspecified osteoarthritis, unspecified site: Secondary | ICD-10-CM | POA: Diagnosis not present

## 2021-10-16 DIAGNOSIS — M1990 Unspecified osteoarthritis, unspecified site: Secondary | ICD-10-CM | POA: Diagnosis not present

## 2021-10-16 DIAGNOSIS — F0150 Vascular dementia without behavioral disturbance: Secondary | ICD-10-CM | POA: Diagnosis not present

## 2021-10-16 DIAGNOSIS — L932 Other local lupus erythematosus: Secondary | ICD-10-CM | POA: Diagnosis not present

## 2021-10-16 DIAGNOSIS — M6281 Muscle weakness (generalized): Secondary | ICD-10-CM | POA: Diagnosis not present

## 2021-10-17 DIAGNOSIS — M1990 Unspecified osteoarthritis, unspecified site: Secondary | ICD-10-CM | POA: Diagnosis not present

## 2021-10-17 DIAGNOSIS — M6281 Muscle weakness (generalized): Secondary | ICD-10-CM | POA: Diagnosis not present

## 2021-10-17 DIAGNOSIS — L932 Other local lupus erythematosus: Secondary | ICD-10-CM | POA: Diagnosis not present

## 2021-10-17 DIAGNOSIS — F0150 Vascular dementia without behavioral disturbance: Secondary | ICD-10-CM | POA: Diagnosis not present

## 2021-10-17 DIAGNOSIS — F4321 Adjustment disorder with depressed mood: Secondary | ICD-10-CM | POA: Diagnosis not present

## 2021-10-18 DIAGNOSIS — M1990 Unspecified osteoarthritis, unspecified site: Secondary | ICD-10-CM | POA: Diagnosis not present

## 2021-10-18 DIAGNOSIS — L932 Other local lupus erythematosus: Secondary | ICD-10-CM | POA: Diagnosis not present

## 2021-10-18 DIAGNOSIS — F0150 Vascular dementia without behavioral disturbance: Secondary | ICD-10-CM | POA: Diagnosis not present

## 2021-10-18 DIAGNOSIS — M6281 Muscle weakness (generalized): Secondary | ICD-10-CM | POA: Diagnosis not present

## 2021-10-19 DIAGNOSIS — L932 Other local lupus erythematosus: Secondary | ICD-10-CM | POA: Diagnosis not present

## 2021-10-19 DIAGNOSIS — M6281 Muscle weakness (generalized): Secondary | ICD-10-CM | POA: Diagnosis not present

## 2021-10-19 DIAGNOSIS — F0150 Vascular dementia without behavioral disturbance: Secondary | ICD-10-CM | POA: Diagnosis not present

## 2021-10-19 DIAGNOSIS — M1990 Unspecified osteoarthritis, unspecified site: Secondary | ICD-10-CM | POA: Diagnosis not present

## 2021-10-20 DIAGNOSIS — F0150 Vascular dementia without behavioral disturbance: Secondary | ICD-10-CM | POA: Diagnosis not present

## 2021-10-20 DIAGNOSIS — M1990 Unspecified osteoarthritis, unspecified site: Secondary | ICD-10-CM | POA: Diagnosis not present

## 2021-10-20 DIAGNOSIS — L932 Other local lupus erythematosus: Secondary | ICD-10-CM | POA: Diagnosis not present

## 2021-10-20 DIAGNOSIS — M6281 Muscle weakness (generalized): Secondary | ICD-10-CM | POA: Diagnosis not present

## 2021-10-23 DIAGNOSIS — M1990 Unspecified osteoarthritis, unspecified site: Secondary | ICD-10-CM | POA: Diagnosis not present

## 2021-10-23 DIAGNOSIS — C3491 Malignant neoplasm of unspecified part of right bronchus or lung: Secondary | ICD-10-CM | POA: Diagnosis not present

## 2021-10-23 DIAGNOSIS — L932 Other local lupus erythematosus: Secondary | ICD-10-CM | POA: Diagnosis not present

## 2021-10-23 DIAGNOSIS — F0150 Vascular dementia without behavioral disturbance: Secondary | ICD-10-CM | POA: Diagnosis not present

## 2021-10-23 DIAGNOSIS — M6281 Muscle weakness (generalized): Secondary | ICD-10-CM | POA: Diagnosis not present

## 2021-10-23 DIAGNOSIS — I951 Orthostatic hypotension: Secondary | ICD-10-CM | POA: Diagnosis not present
